# Patient Record
Sex: Female | Born: 1960 | Race: White | Hispanic: No | Marital: Married | Smoking: Never smoker
Health system: Southern US, Community
[De-identification: ages and names within clinical notes are randomized; demographics above are authoritative.]

## PROBLEM LIST (undated history)

## (undated) DIAGNOSIS — E7800 Pure hypercholesterolemia, unspecified: Secondary | ICD-10-CM

## (undated) DIAGNOSIS — F329 Major depressive disorder, single episode, unspecified: Secondary | ICD-10-CM

## (undated) DIAGNOSIS — K760 Fatty (change of) liver, not elsewhere classified: Secondary | ICD-10-CM

## (undated) DIAGNOSIS — K219 Gastro-esophageal reflux disease without esophagitis: Secondary | ICD-10-CM

## (undated) DIAGNOSIS — E039 Hypothyroidism, unspecified: Secondary | ICD-10-CM

## (undated) DIAGNOSIS — F319 Bipolar disorder, unspecified: Secondary | ICD-10-CM

## (undated) DIAGNOSIS — F32A Depression, unspecified: Secondary | ICD-10-CM

## (undated) DIAGNOSIS — F419 Anxiety disorder, unspecified: Secondary | ICD-10-CM

## (undated) DIAGNOSIS — S42202D Unspecified fracture of upper end of left humerus, subsequent encounter for fracture with routine healing: Secondary | ICD-10-CM

## (undated) HISTORY — DX: Anxiety disorder, unspecified: F41.9

## (undated) HISTORY — PX: DILATION AND CURETTAGE OF UTERUS: SHX78

## (undated) HISTORY — DX: Gastro-esophageal reflux disease without esophagitis: K21.9

## (undated) HISTORY — DX: Major depressive disorder, single episode, unspecified: F32.9

## (undated) HISTORY — DX: Depression, unspecified: F32.A

## (undated) HISTORY — DX: Unspecified fracture of upper end of left humerus, subsequent encounter for fracture with routine healing: S42.202D

## (undated) HISTORY — DX: Hypothyroidism, unspecified: E03.9

---

## 1991-02-20 HISTORY — PX: LAPAROSCOPIC CHOLECYSTECTOMY: SUR755

## 1997-08-07 ENCOUNTER — Emergency Department (HOSPITAL_COMMUNITY): Admission: EM | Admit: 1997-08-07 | Discharge: 1997-08-07 | Payer: Self-pay | Admitting: *Deleted

## 1998-04-15 ENCOUNTER — Other Ambulatory Visit: Admission: RE | Admit: 1998-04-15 | Discharge: 1998-04-15 | Payer: Self-pay | Admitting: Obstetrics and Gynecology

## 1999-04-06 ENCOUNTER — Other Ambulatory Visit: Admission: RE | Admit: 1999-04-06 | Discharge: 1999-04-06 | Payer: Self-pay | Admitting: Obstetrics and Gynecology

## 1999-08-18 ENCOUNTER — Inpatient Hospital Stay (HOSPITAL_COMMUNITY): Admission: AD | Admit: 1999-08-18 | Discharge: 1999-08-23 | Payer: Self-pay | Admitting: Psychiatry

## 1999-08-24 ENCOUNTER — Other Ambulatory Visit: Admission: RE | Admit: 1999-08-24 | Discharge: 1999-09-11 | Payer: Self-pay

## 2000-05-07 ENCOUNTER — Other Ambulatory Visit: Admission: RE | Admit: 2000-05-07 | Discharge: 2000-05-07 | Payer: Self-pay | Admitting: Obstetrics and Gynecology

## 2000-06-20 ENCOUNTER — Encounter: Admission: RE | Admit: 2000-06-20 | Discharge: 2000-06-20 | Payer: Self-pay | Admitting: Family Medicine

## 2000-06-20 ENCOUNTER — Encounter: Payer: Self-pay | Admitting: Family Medicine

## 2001-05-26 ENCOUNTER — Other Ambulatory Visit: Admission: RE | Admit: 2001-05-26 | Discharge: 2001-05-26 | Payer: Self-pay | Admitting: Obstetrics and Gynecology

## 2002-04-06 ENCOUNTER — Encounter: Payer: Self-pay | Admitting: Emergency Medicine

## 2002-04-06 ENCOUNTER — Emergency Department (HOSPITAL_COMMUNITY): Admission: EM | Admit: 2002-04-06 | Discharge: 2002-04-06 | Payer: Self-pay | Admitting: Emergency Medicine

## 2002-06-23 ENCOUNTER — Other Ambulatory Visit: Admission: RE | Admit: 2002-06-23 | Discharge: 2002-06-23 | Payer: Self-pay | Admitting: Obstetrics and Gynecology

## 2003-07-08 ENCOUNTER — Other Ambulatory Visit: Admission: RE | Admit: 2003-07-08 | Discharge: 2003-07-08 | Payer: Self-pay | Admitting: Obstetrics and Gynecology

## 2004-07-27 ENCOUNTER — Other Ambulatory Visit: Admission: RE | Admit: 2004-07-27 | Discharge: 2004-07-27 | Payer: Self-pay | Admitting: Obstetrics and Gynecology

## 2006-03-21 ENCOUNTER — Ambulatory Visit: Payer: Self-pay | Admitting: Cardiology

## 2006-03-21 ENCOUNTER — Observation Stay (HOSPITAL_COMMUNITY): Admission: EM | Admit: 2006-03-21 | Discharge: 2006-03-23 | Payer: Self-pay | Admitting: Emergency Medicine

## 2006-03-28 ENCOUNTER — Ambulatory Visit: Payer: Self-pay | Admitting: Gastroenterology

## 2006-04-02 ENCOUNTER — Ambulatory Visit: Payer: Self-pay | Admitting: Gastroenterology

## 2006-04-02 LAB — CONVERTED CEMR LAB
ALT: 107 units/L — ABNORMAL HIGH (ref 0–40)
AST: 75 units/L — ABNORMAL HIGH (ref 0–37)
Alkaline Phosphatase: 103 units/L (ref 39–117)
Total Bilirubin: 0.7 mg/dL (ref 0.3–1.2)
Total Protein: 6.8 g/dL (ref 6.0–8.3)
Transferrin: 252 mg/dL (ref >=212.0)

## 2006-05-02 ENCOUNTER — Ambulatory Visit: Payer: Self-pay | Admitting: Gastroenterology

## 2006-05-02 LAB — CONVERTED CEMR LAB
ALT: 45 units/L — ABNORMAL HIGH (ref 0–40)
AST: 34 units/L (ref 0–37)
Alkaline Phosphatase: 88 units/L (ref 39–117)
Total Bilirubin: 0.5 mg/dL (ref 0.3–1.2)
Total Protein: 7.2 g/dL (ref 6.0–8.3)

## 2008-11-23 LAB — HM DIABETES EYE EXAM

## 2008-12-01 ENCOUNTER — Encounter: Admission: RE | Admit: 2008-12-01 | Discharge: 2008-12-01 | Payer: Self-pay | Admitting: Emergency Medicine

## 2009-02-10 ENCOUNTER — Encounter: Admission: RE | Admit: 2009-02-10 | Discharge: 2009-02-10 | Payer: Self-pay | Admitting: Family Medicine

## 2009-03-20 LAB — HM DIABETES EYE EXAM

## 2009-06-28 ENCOUNTER — Emergency Department (HOSPITAL_COMMUNITY): Admission: EM | Admit: 2009-06-28 | Discharge: 2009-06-28 | Payer: Self-pay | Admitting: Emergency Medicine

## 2010-03-12 ENCOUNTER — Encounter: Payer: Self-pay | Admitting: Family Medicine

## 2010-03-20 LAB — HM DIABETES EYE EXAM

## 2010-05-09 LAB — POCT CARDIAC MARKERS
CKMB, poc: <1 ng/mL — ABNORMAL LOW (ref 1.0–8.0)
Myoglobin, poc: 38.6 ng/mL (ref 12–200)
Myoglobin, poc: 50.1 ng/mL (ref 12–200)
Troponin i, poc: <0.05 ng/mL (ref 0.00–0.09)
Troponin i, poc: <0.05 ng/mL (ref 0.00–0.09)

## 2010-05-09 LAB — CBC
MCHC: 34.6 g/dL (ref 30.0–36.0)
Platelets: 255 10*3/uL (ref 150–400)
RDW: 13.4 % (ref 11.5–15.5)

## 2010-05-09 LAB — DIFFERENTIAL
Basophils Absolute: 0.1 10*3/uL (ref 0.0–0.1)
Basophils Relative: 1 % (ref 0–1)
Eosinophils Absolute: 0.1 10*3/uL (ref 0.0–0.7)
Neutro Abs: 8.7 10*3/uL — ABNORMAL HIGH (ref 1.7–7.7)
Neutrophils Relative %: 68 % (ref 43–77)

## 2010-05-09 LAB — POCT I-STAT, CHEM 8
Chloride: 109 mEq/L (ref 96–112)
HCT: 38 % (ref 36.0–46.0)
Hemoglobin: 12.9 g/dL (ref 12.0–15.0)
Potassium: 4 mEq/L (ref 3.5–5.1)

## 2010-07-07 NOTE — H&P (Signed)
NAMEKAMLA, SKILTON NO.:  [id number]   MEDICAL RECORD NO.:  [id number]          PATIENT TYPE:  INP   LOCATION:  3735                         FACILITY:  [facility]   PHYSICIAN:  Tereso Newcomer, PA-C     DATE OF BIRTH:  30-Sep-1960   DATE OF ADMISSION:  03/21/2006  DATE OF DISCHARGE:                              HISTORY & PHYSICAL   PRIMARY CARE PHYSICIAN:  Dr. Lesle Chris.   CHIEF COMPLAINT:  Chest pain history of present illness.   HISTORY OF PRESENT ILLNESS:  Ms. Musto is a 50 year old female patient  with a new diagnosis of diabetes mellitus this past week, who presented  to her primary care physician today for follow-up on chest pain and  dizziness.  When she was originally seen, her blood sugar was over 300,  and it was felt that she probably was suffering dizziness secondary to  her diabetes from dehydration.  She was started on medication and seen  back today in followup.  She also had some chest discomfort.  Today, she  noted continued chest discomfort.  This has been off and on for the last  week.  It is a substernal tightness that she describes as a knot.  She  denies any radiation to her arm or jaw.  She denies any associated  shortness of breath.  She has noted associated nausea and diaphoresis.  She has also noted some dizziness.  Her dizziness is sometimes  associated with chest pain.  There is other times where she is dizzy  without chest pain.  She does not always get dizzy with chest pain.  She  has also had some other kinds of dizziness that she describes as a  spinning sensation, and she has had a diagnosis of vertigo in the past.  She does describe an episode the other that she thinks she passed out.  She was having chest pain and became dizzy.  She feels like she lost  consciousness for a few seconds.  She has also had episodes of tachy  palpitations with her chest pain.  She is currently having a slight  amount of chest discomfort.   PAST  MEDICAL HISTORY:  1. Significant for diabetes mellitus type 2, newly diagnosed this      week, elevated LFTs.  2. Elevated LFTs in the past, secondary to Risperdal.  3. Irritable bowel syndrome.  4. Gastroesophageal reflex disease.  5. History of cholecystectomy.  6. History of vertigo.  7. Migraine headaches.  8. Bipolar disorder.   She denies any history of hypertension or hyperlipidemia.   MEDICATIONS:  1. Metformin 500 mg b.i.d.  2. Depakote ER 500 mg daily.  3. Neurontin 300 mg 2 tablets daily.  4. Lamictal 100 mg daily.  5. Lyrica 75 mg daily.  6. Topamax 100 mg daily.  7. Trileptal 100 mg 1-1/2 tablet daily.  8. Klonopin 1 mg 1-1/2 tablet daily.  9. Rhinocort Aqua.   ALLERGIES:  RISPERDAL.   SOCIAL HISTORY:  The patient lives in [place] with her husband.  Her  husband is a patient of Dr. Tenny Craw.  She works at [employer] [place].  She  has 2 children age 68 and 13.  She denies tobacco or alcohol abuse.  Denies any drug abuse.   FAMILY HISTORY:  She is unsure about her father.  She does have a  maternal grandfather who had a myocardial infarction in his 14s.  Her  mother is alive and well.   REVIEW OF SYSTEMS:  Please see HPI.  Denies any fevers, chills cough,  melena, hematochezia, hematuria, dysuria.  She denies any hematemesis or  hemoptysis.  She denies any abdominal discomfort.  She did have a viral  gastroenteritis last week associated with vomiting.  This is clear.  She  denies any unilateral weakness, loss of vision or facial drooping or  difficulties with speech.  She denies any orthopnea or paroxysmal  nocturnal dyspnea.  She denies any dysphagia or odynophagia.  The rest  of the review of systems are negative.   PHYSICAL EXAM:  GENERAL:  She is a well-nourished, well-developed female  in no distress.  Blood pressure initially is 88/58.  Repeat blood  pressure was 160/98.  Recent blood pressure is 92/62, pulse 72,  respirations 20, temperature 97.2.  Oxygen  saturation is 99% on 2  liters.  Head normocephalic, atraumatic.  Eyes: PERRLA, EOMI.  Sclerae  are clear.  NECK:  Supple without lymphadenopathy.  No JVD noted.  ENDOCRINE:  Without thyromegaly.  VASCULAR:  Exam without carotid bruits bilaterally.  Dorsalis pedis and  posterior tibialis pulses 1+ bilaterally.  CARDIAC:  Normal S1-S2, regular rate and rhythm without murmurs.  LUNGS:  Clear to auscultation bilaterally without wheezing, rhonchi or  rales.  ABDOMEN:  Soft, nontender with normoactive bowel sounds, no  organomegaly.  No rebound, no guarding.  EXTREMITIES:  Without clubbing, cyanosis or edema.  MUSCULOSKELETAL:  Without spine or CVA tenderness.  SKIN:  Without rashes.  Skin is warm and dry.  NEUROLOGIC:  She is alert and oriented x3.  Cranial nerves II-XII are  grossly intact.   Electrocardiogram reveals sinus rhythm with a heart rate of 69, normal  axis.  She has nonspecific ST-T wave changes.   LABORATORY DATA:  From January 24th, drawn at Dr. Ellis Parents office:  TSH 2.412, total  bilirubin 0.3, AST 182, alkaline phosphatase 154, ALT 200, point of care  markers in the ER today negative x1.  INR in the ER today 0.9, sodium  35, potassium 4.3, BUN 6, creatinine 0.8, glucose 263, hemoglobin 14.3,  hematocrit 41.5, platelet count 236,000, white count 7,000.   Chest x-ray is pending.   IMPRESSION:  1. Chest pain worrisome for unstable angina pectoris.  2. Dizziness slash questionable syncope - associated with  3. Diabetes mellitus.  4. Newly diagnosed diabetes mellitus.  5. Bipolar disorder.  6. Elevated LFTs.  7. Gastroesophageal gastroesophageal reflux disease.  8. Irritable bowel syndrome.  9. History of migraine headaches.   PLAN:  The patient will be admitted to [facility].  We will  admit, and we will treat her with aspirin, Heparin.  Will try to add beta-blocker therapy as her blood pressure allows.  Will re-check her  lipid panel as well as amylase,  lipase, abdominal ultrasound.  Will also  get a Hepatitis panel.  Will get the gastroenterology service to see her  regarding her elevated LFTs.  She will require cardiac catheterization  to further evaluate her symptoms.  Risks and benefits of the procedure  have been discussed with the patient.  She agrees to  proceed.  The  patient was also interviewed and examined by Dr. Antoine Poche today.  He  helped formulate the above assessment and plan.      Tereso Newcomer, PA-C     SW/MEDQ  D:  03/21/2006  T:  03/21/2006  Job:  [id number]   cc:   Stan Head. Cleta Alberts, M.D.
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
## 2010-07-07 NOTE — Assessment & Plan Note (Signed)
[facility]                         GASTROENTEROLOGY OFFICE NOTE   Michelle Torres, Michelle Torres                      MRN:          [id number]  DATE:04/02/2006                            DOB:          06-19-1960    PRIMARY CARE PHYSICIAN:  Brett Canales A. Cleta Alberts, M.D.   PSYCHIATRIST:  Dr. Madaline Guthrie.   GI PROBLEM LIST:  1. Abnormal liver test: initially found to have elevated transaminases      January 2008.  AST 158, ALT 166.  Total bilirubin, alkaline      phosphate and albumin were all normal.  An ultrasound 2/08 showed a      diffusely increased echo texture consistent with fatty      infiltration.  Her CBD was 12 mm.  No stones in her bile duct.      Gallbladder had been surgically removed.  Workup for abnormal liver      test include ANA, antismooth muscle antibody, AMA, hepatitis A, B,      C; all these tests were negative.  TSH was normal.   INTERVAL HISTORY:  I last saw Michelle Torres when she was hospitalized at  [facility], consulted for abnormal liver test.  There was also  a question of whether her chest discomfort where esophageal in origin.  She began taking a proton pump inhibitor (omeprazole) once daily 20-30  minutes before her breakfast meal and her chest discomfort have not  returned.  So perhaps these were reflux related.  The lab workup that I  ordered while she was hospitalized has returned and is all summarized  above.   CURRENT MEDICATIONS:  Depakote, Neurontin, Lamictal, Lyrica, Topamax,  Trileptal, Klonopin, Prilosec, metformin, insulin and Nasonex.   ALLERGIES:  RISPERDAL.   PHYSICAL EXAMINATION:  At 4 foot 10-1/2 inches, 188 pounds.  Blood  pressure 110/78, pulse 76.  CONSTITUTIONAL:  Obese, otherwise well-appearing.  NEUROLOGICAL:  Alert and oriented x3.  EYES:  Extraocular movements intact.  ABDOMEN:  Soft, nontender, and nondistended.  Normal bowel sounds.  No  obvious ascites.  EXTREMITIES:  No lower extremity edema.   ASSESSMENT/PLAN:  A 50 year old woman with elevated transaminases,  likely from underlying fatty liver disease.  Most likely this is from  underlying fatty liver disease.  She has gained 30 pounds in the past  year.  She has a hemoglobin A1c of 13 and there is still diffusely  hyperechoic look to her liver on ultrasound.  All of this consistent  with fatty liver disease.  She does have a dilated bile duct but she  does not have convincing biliary symptoms.  No stones in her bile duct  and her bilirubin and alkaline phosphate are normal, so I do not think  this is biliary induced problem.  She is on several medicines for her  bipolar disease, most notable which is Depakote.  She has been on that  for many years but hepatic toxicity is a well known and severe  complication of Depakote.  Her liver tests are not at a range right now  where I am very nervous but I think they should  be followed closely.  I  have advised her to try to lose weight as best as possible.  I will  recheck her liver test today and I will check them monthly for a while  just to make sure there is no worrisome trend of them going much higher.  If they do start to trend up, she will need to stop her Depakote  immediately.  I will communicate this with Dr. Madaline Guthrie.  I will round up  the workup for her abnormal liver test with iron studies as they were  not done while she was hospitalized.  She seems committed to trying to  lose weight since she has been successful in the past.  Hopefully, she  will be able to.  If we do see a nice trend of her liver test heading  more normal, than I do not think we need to proceed with any more  testing.  If, however, they do not improve or they worsening we will  have to consider liver biopsy.     Rachael Fee, MD  Electronically Signed    DPJ/MedQ  DD: 04/02/2006  DT: 04/02/2006  Job #: [id number]   cc:   Brett Canales A. Cleta Alberts, M.D.

## 2010-07-07 NOTE — Cardiovascular Report (Signed)
NAMEKASSIDEE, NARCISO NO.:  [id number]   MEDICAL RECORD NO.:  [id number]          PATIENT TYPE:  INP   LOCATION:  3735                         FACILITY:  [facility]   PHYSICIAN:  Salvadore Farber, MD  DATE OF BIRTH:  [birth year]   DATE OF PROCEDURE:  03/22/2006  DATE OF DISCHARGE:                            CARDIAC CATHETERIZATION   PROCEDURE:  Left heart catheterization, left ventriculography, coronary  angiography, StarClose closure of the right common femoral arteriotomy  site.   INDICATIONS:  Ms. Loria is a 50 year old lady with newly diagnosed  diabetes mellitus who has suffered week of substernal chest tightness  occurring primarily at rest but made worse with exertion and better and  then improving with rest.  She was seen by Dr. Antoine Poche.  She ruled out  for myocardial infarction by serial electrocardiograms and cardiac  enzymes.  She was then referred for diagnostic angiography.   PROCEDURAL TECHNIQUE:  Informed consent was obtained.  Under 1%  lidocaine local anesthesia, a 5-French sheath was placed in the right  common femoral artery using the modified Seldinger technique.  Diagnostic angiography and ventriculography were performed using JL-4,  JR-4, and pigtail catheters.  The arteriotomy was then closed using a  StarClose device.  Complete hemostasis was obtained.  The patient was  then transferred to the holding room in stable condition having  tolerated the procedure well.   COMPLICATIONS:  None.   FINDINGS:  1. LV:  101/10/18.  EF 65% without regional wall motion abnormality.  2. No aortic stenosis or mitral regurgitation.  3. Left main:  Angiographically normal.  4. LAD:  Large vessel giving rise to one moderate-sized diagonal.  It      is angiographically normal.  5. Ramus intermedius:  Moderate-sized vessel which is angiographically      normal.  6. Circumflex:  Moderate-sized vessel giving rise to one obtuse      marginal and a posterior  left ventricular branch.  It is      angiographically normal.  7. RCA:  Moderate-sized dominant vessel.  It is angiographically      normal.   IMPRESSION/RECOMMENDATIONS:  1. Angiographically normal coronary arteries.  2. Normal left ventricular size and systolic function.  3. No aortic stenosis or mitral regurgitation.   I suspect a noncardiac etiology to her chest discomfort.  We will  evaluate further as an outpatient.      Salvadore Farber, MD  Electronically Signed     WED/MEDQ  D:  03/22/2006  T:  03/22/2006  Job:  [id number]   cc:   Brett Canales A. Cleta Alberts, M.D.  Rollene Rotunda, MD, [facility]
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
## 2010-07-07 NOTE — Discharge Summary (Signed)
Michelle Torres, Michelle Torres               ACCOUNT NO.:  [id number]   MEDICAL RECORD NO.:  [id number]          PATIENT TYPE:  INP   LOCATION:  3735                         FACILITY:  [facility]   PHYSICIAN:  Noralyn Pick. Eden Emms, MD, FACCDATE OF BIRTH:  [birth year]   DATE OF ADMISSION:  03/21/2006  DATE OF DISCHARGE:  03/23/2006                               DISCHARGE SUMMARY   PRIMARY CARE PHYSICIAN:  Dr. Earl Lites.   CONSULTANTS:  Gastroenterologist, Dr. Christella Hartigan.   PRINCIPAL DIAGNOSIS:  Chest pain.   SECONDARY DIAGNOSES:  1. Newly diagnosed type 2 diabetes mellitus.  2. Elevated liver function tests, likely secondary to fatty liver.  3. Irritable bowel syndrome.  4. Gastroesophageal reflux disease.  5. Status post cholecystectomy.  6. History of vertigo.  7. History of migraine headaches.  8. Bipolar disorder.   ALLERGIES:  RISPERDAL causes elevated LFTs.   PROCEDURES:  Left heart cardiac catheterization.   HISTORY OF PRESENT ILLNESS:  A 50 year old female recently diagnosed  with type 2 diabetes mellitus, who was initiated on metformin therapy  b.i.d. this week.  Over the past week, she has had a knot-like  midsternal chest tightness and discomfort without radiation or  associated symptoms, for which she presented to the [facility] ED on  March 21, 2006.  She ruled out for MI and was admitted for further  evaluation.   HOSPITAL COURSE:  Following admission, she was noted to have markedly  elevated LFTs with an AST of 182, ALT of 200, and alkaline phosphatase  of 154.  Gastroenterology was consulted and following abdominal  ultrasound, they felt that elevated LFTs were likely secondary to fatty  infiltration of the liver.  Given her complaints of chest pain, which  were concerning for angina with newly diagnosed diabetes, she underwent  left heart cardiac catheterization on March 22, 2006, revealing an EF  of 65% and normal coronary arteries.  Her symptoms were thus felt to be  secondary to GERD and she was placed on proton pump inhibitor therapy.  She will follow up with Dr. Christella Hartigan in GI in approximately two to three  weeks.  She is being discharged home today in satisfactory condition.   DISCHARGE LABORATORY DATA:  Hemoglobin 13.6, hematocrit 39.7, WBCs 6.7,  platelets 230.  Sodium 136, potassium 3.7, chloride 106, CO2 of 22, BUN  5, creatinine 0.65, glucose 260, total bilirubin 0.4, alkaline  phosphatase 99, AST 158, ALT 166, total protein 6.5, albumin 3.7,  calcium 8.6.  Hemoglobin A1c 13.0.  Amylase 108 and lipase 34.  CK 65,  MB 0.6, troponin I of 0.01.  Total cholesterol 201, triglycerides 249,  HDL 43, LDL 108.  TSH 1.174.  Valproic acid 41.4.   DISPOSITION:  The patient is being discharged home today in good  condition.   FOLLOWUP:  She was asked to follow up with her primary care physician,  Dr. Pollie Friar, in one to two weeks.  She will be contacted by [facility] to  arrange follow up with Dr. Christella Hartigan in two to three weeks.   DISCHARGE MEDICATIONS:  Protonix 40 mg daily, aspirin 81  mg daily,  metformin 500 mg b.i.d., Depakote ER 500 mg daily, Neurontin 300 mg  b.i.d., Lyrica 75 mg daily, Topamax 100 mg daily, Trileptal 150 mg  daily, Klonopin 1.5 mg daily.  The patient was not initiated on statin  therapy despite diagnosis of diabetes secondary to elevated LFTs.   OUTSTANDING LABORATORY STUDIES:  None.   DURATION OF DISCHARGE ENCOUNTER:  Forty minutes, including physician's  time.      Nicolasa Ducking, ANP      Noralyn Pick. Eden Emms, MD, [facility]  Electronically Signed    CB/MEDQ  D:  03/23/2006  T:  03/24/2006  Job:  [id number]   cc:   Meryl Dare, MD

## 2011-02-22 ENCOUNTER — Ambulatory Visit (INDEPENDENT_AMBULATORY_CARE_PROVIDER_SITE_OTHER): Payer: BC Managed Care – PPO

## 2011-02-22 ENCOUNTER — Observation Stay (HOSPITAL_COMMUNITY)
Admission: EM | Admit: 2011-02-22 | Discharge: 2011-02-23 | Disposition: A | Discharge status: Home / Self Care | Payer: BC Managed Care – PPO | Attending: Emergency Medicine | Admitting: Emergency Medicine

## 2011-02-22 ENCOUNTER — Emergency Department (HOSPITAL_COMMUNITY): Payer: BC Managed Care – PPO

## 2011-02-22 ENCOUNTER — Encounter: Payer: Self-pay | Admitting: Physical Medicine and Rehabilitation

## 2011-02-22 DIAGNOSIS — I10 Essential (primary) hypertension: Secondary | ICD-10-CM | POA: Insufficient documentation

## 2011-02-22 DIAGNOSIS — R0602 Shortness of breath: Secondary | ICD-10-CM | POA: Insufficient documentation

## 2011-02-22 DIAGNOSIS — E119 Type 2 diabetes mellitus without complications: Secondary | ICD-10-CM | POA: Insufficient documentation

## 2011-02-22 DIAGNOSIS — E7800 Pure hypercholesterolemia, unspecified: Secondary | ICD-10-CM

## 2011-02-22 DIAGNOSIS — F3289 Other specified depressive episodes: Secondary | ICD-10-CM | POA: Insufficient documentation

## 2011-02-22 DIAGNOSIS — R079 Chest pain, unspecified: Principal | ICD-10-CM | POA: Insufficient documentation

## 2011-02-22 DIAGNOSIS — R110 Nausea: Secondary | ICD-10-CM | POA: Insufficient documentation

## 2011-02-22 DIAGNOSIS — F329 Major depressive disorder, single episode, unspecified: Secondary | ICD-10-CM | POA: Insufficient documentation

## 2011-02-22 DIAGNOSIS — R0789 Other chest pain: Secondary | ICD-10-CM

## 2011-02-22 HISTORY — DX: Pure hypercholesterolemia, unspecified: E78.00

## 2011-02-22 HISTORY — DX: Bipolar disorder, unspecified: F31.9

## 2011-02-22 LAB — BASIC METABOLIC PANEL
Calcium: 9.5 mg/dL (ref 8.4–10.5)
Creatinine, Ser: 0.67 mg/dL (ref 0.50–1.10)
GFR calc non Af Amer: >90 mL/min (ref >90)
Glucose, Bld: 143 mg/dL — ABNORMAL HIGH (ref 70–99)
Sodium: 140 mEq/L (ref 135–145)

## 2011-02-22 LAB — DIFFERENTIAL
Basophils Absolute: 0.1 10*3/uL (ref 0.0–0.1)
Eosinophils Absolute: 0.1 10*3/uL (ref 0.0–0.7)
Eosinophils Relative: 1 % (ref 0–5)
Monocytes Absolute: 0.6 10*3/uL (ref 0.1–1.0)

## 2011-02-22 LAB — CBC
MCH: 29.5 pg (ref 26.0–34.0)
MCV: 87.9 fL (ref 78.0–100.0)
Platelets: 258 10*3/uL (ref 150–400)
RDW: 13.2 % (ref 11.5–15.5)

## 2011-02-22 LAB — PROTIME-INR
INR: 0.95 (ref 0.00–1.49)
Prothrombin Time: 12.9 seconds (ref 11.6–15.2)

## 2011-02-22 LAB — CARDIAC PANEL(CRET KIN+CKTOT+MB+TROPI)
CK, MB: 3.1 ng/mL (ref 0.3–4.0)
Relative Index: INVALID (ref 0.0–2.5)
Relative Index: INVALID (ref 0.0–2.5)
Troponin I: <0.3 ng/mL (ref <0.30)

## 2011-02-22 MED ORDER — NAPROXEN SODIUM 275 MG PO TABS: 440.0000 mg | ORAL_TABLET | Freq: Two times a day (BID) | ORAL | Status: DC

## 2011-02-22 MED ORDER — LAMIVUDINE 150 MG PO TABS
150.0000 mg | ORAL_TABLET | Freq: Every day | ORAL | Status: DC
  Administered 2011-02-22 – 2011-02-23 (×2): 150 mg via ORAL
  Filled 2011-02-22 (×2): qty 1

## 2011-02-22 MED ORDER — DIVALPROEX SODIUM ER 500 MG PO TB24
500.0000 mg | ORAL_TABLET | Freq: Every day | ORAL | Status: DC
  Administered 2011-02-22: 500 mg via ORAL
  Filled 2011-02-22: qty 1

## 2011-02-22 MED ORDER — MORPHINE SULFATE 2 MG/ML IJ SOLN
2.0000 mg | Freq: Once | INTRAMUSCULAR | Status: AC
  Administered 2011-02-22: 2 mg via INTRAVENOUS
  Filled 2011-02-22: qty 1

## 2011-02-22 MED ORDER — INSULIN GLARGINE 100 UNIT/ML ~~LOC~~ SOLN
36.0000 [IU] | Freq: Every day | SUBCUTANEOUS | Status: DC
  Administered 2011-02-22: 36 [IU] via SUBCUTANEOUS
  Filled 2011-02-22: qty 1

## 2011-02-22 MED ORDER — ACETAMINOPHEN 325 MG PO TABS: 650.0000 mg | ORAL_TABLET | ORAL | Status: DC | PRN

## 2011-02-22 MED ORDER — ASPIRIN EC 325 MG PO TBEC: 325.0000 mg | DELAYED_RELEASE_TABLET | Freq: Every day | ORAL | Status: DC

## 2011-02-22 MED ORDER — LAMIVUDINE 150 MG PO TABS: 150.0000 mg | ORAL_TABLET | Freq: Every day | ORAL | Status: DC

## 2011-02-22 MED ORDER — TOPIRAMATE 100 MG PO TABS
100.0000 mg | ORAL_TABLET | Freq: Every day | ORAL | Status: DC
  Administered 2011-02-22: 100 mg via ORAL
  Filled 2011-02-22: qty 1

## 2011-02-22 MED ORDER — GABAPENTIN 600 MG PO TABS
600.0000 mg | ORAL_TABLET | Freq: Every day | ORAL | Status: DC
  Administered 2011-02-22: 600 mg via ORAL
  Filled 2011-02-22: qty 1

## 2011-02-22 MED ORDER — NAPROXEN 500 MG PO TABS
500.0000 mg | ORAL_TABLET | Freq: Two times a day (BID) | ORAL | Status: DC
  Administered 2011-02-23: 500 mg via ORAL
  Filled 2011-02-22: qty 1

## 2011-02-22 MED ORDER — CLONAZEPAM 0.5 MG PO TABS
3.0000 mg | ORAL_TABLET | Freq: Every day | ORAL | Status: DC
  Administered 2011-02-22: 3 mg via ORAL
  Filled 2011-02-22: qty 6

## 2011-02-22 MED ORDER — GI COCKTAIL ~~LOC~~: 30.0000 mL | Freq: Four times a day (QID) | ORAL | Status: DC | PRN

## 2011-02-22 MED ORDER — OXCARBAZEPINE 150 MG PO TABS
150.0000 mg | ORAL_TABLET | Freq: Every day | ORAL | Status: DC
  Administered 2011-02-22: 150 mg via ORAL
  Filled 2011-02-22: qty 1

## 2011-02-22 MED ORDER — CALCIUM CARBONATE-VITAMIN D 500-200 MG-UNIT PO TABS
1.0000 | ORAL_TABLET | Freq: Every day | ORAL | Status: DC
  Administered 2011-02-22 – 2011-02-23 (×2): 1 via ORAL
  Filled 2011-02-22 (×2): qty 1

## 2011-02-22 MED ORDER — ONDANSETRON HCL 4 MG/2ML IJ SOLN: 4.0000 mg | Freq: Four times a day (QID) | INTRAMUSCULAR | Status: DC | PRN

## 2011-02-22 MED ORDER — ZOLPIDEM TARTRATE 5 MG PO TABS: 5.0000 mg | ORAL_TABLET | Freq: Every evening | ORAL | Status: DC | PRN

## 2011-02-22 MED ORDER — SERTRALINE HCL 25 MG PO TABS
25.0000 mg | ORAL_TABLET | Freq: Every day | ORAL | Status: DC
  Administered 2011-02-22: 25 mg via ORAL
  Filled 2011-02-22: qty 1

## 2011-02-22 MED ORDER — ASPIRIN 325 MG PO TABS
325.0000 mg | ORAL_TABLET | Freq: Every day | ORAL | Status: DC
  Administered 2011-02-22: 325 mg via ORAL
  Filled 2011-02-22: qty 1

## 2011-02-22 MED ORDER — PREGABALIN 75 MG PO CAPS
75.0000 mg | ORAL_CAPSULE | Freq: Every day | ORAL | Status: DC
  Administered 2011-02-22: 75 mg via ORAL
  Filled 2011-02-22: qty 1

## 2011-02-22 MED ORDER — METFORMIN HCL 500 MG PO TABS
1000.0000 mg | ORAL_TABLET | Freq: Two times a day (BID) | ORAL | Status: DC
  Administered 2011-02-23: 1000 mg via ORAL
  Filled 2011-02-22: qty 2

## 2011-02-22 MED ORDER — METFORMIN HCL 500 MG PO TABS: 1000.0000 mg | ORAL_TABLET | Freq: Two times a day (BID) | ORAL | Status: DC

## 2011-02-22 MED ORDER — ONDANSETRON HCL 4 MG/2ML IJ SOLN
4.0000 mg | Freq: Once | INTRAMUSCULAR | Status: AC
  Administered 2011-02-22: 4 mg via INTRAVENOUS
  Filled 2011-02-22: qty 2

## 2011-02-22 NOTE — ED Notes (Signed)
Diabetic Diet Tray Ordered

## 2011-02-22 NOTE — ED Notes (Signed)
Lab at the bedside attempting to draw. Pt remains on cardiac monitor. Resting quietly. 3/10 chest pain. No signs of distress at the time.

## 2011-02-22 NOTE — ED Notes (Signed)
Pt in per [facility] from [facility], pt picked at 11:40, pt rcvd 3 SL Nitro & ASA prior to arrival, pt c/o CP onset this am @ 3:30, NAD

## 2011-02-22 NOTE — ED Notes (Signed)
Pt placed in CDU rm 7. Noted alert and oriented. Placed on continuous cardiac, bp and pulse ox monitor. Noted husband at bedside. IV site SL and benign. Pt weighed for chest pain protocol and results documented and given to NP.

## 2011-02-22 NOTE — ED Notes (Signed)
Pt continues to state she has a dull ache in her substernal chest. Pain rating 3/10 at present. Pt lying in bed, NAD, laughing, conversing with visitors.

## 2011-02-22 NOTE — ED Provider Notes (Signed)
History     CSN: [id number]  Arrival date & time 02/22/11  1247   First MD Initiated Contact with Patient 02/22/11 1308      Chief Complaint  Patient presents with  . Chest Pain    (Consider location/radiation/quality/duration/timing/severity/associated sxs/prior treatment) HPI Comments: Patient presents via EMS from urgent care with substernal chest pain it started around 3 AM. He has been constant since. Is associated with some diaphoresis, nausea and shortness of breath. She received nitroglycerin and aspirin with some improvement. She's not had pain like this in the past. She was at her normal doctor's appointment for diabetes recheck. She reports having a negative catheterization about 4 years ago at this facility though I do not see that in the computer.  As a history of hypertension and hyperlipidemia. There is nothing that makes the pain better or worse.  The history is provided by the patient.    Past Medical History  Diagnosis Date  . Diabetes mellitus   . Depression   . Hypertension     Past Surgical History  Procedure Date  . Cholecystectomy     No family history on file.  History  Substance Use Topics  . Smoking status: Not on file  . Smokeless tobacco: Not on file  . Alcohol Use:     OB History    Grav Para Term Preterm Abortions TAB SAB Ect Mult Living                  Review of Systems  Constitutional: Negative for fever, activity change and appetite change.  HENT: Negative for congestion and rhinorrhea.   Respiratory: Positive for chest tightness and shortness of breath. Negative for cough.   Cardiovascular: Positive for chest pain.  Gastrointestinal: Positive for nausea. Negative for vomiting and abdominal pain.  Genitourinary: Negative for dysuria, vaginal bleeding and vaginal discharge.  Musculoskeletal: Negative for back pain.  Skin: Negative for rash.  Neurological: Negative for headaches.    Allergies  Risperidone and related  Home  Medications   Current Outpatient Rx  Name Route Sig Dispense Refill  . ASPIRIN 325 MG PO TABS Oral Take 325 mg by mouth daily.      Ruthell Rummage 500/200 D-3 PO Oral Take 1 tablet by mouth daily.      Marland Kitchen CLONAZEPAM 1 MG PO TABS Oral Take 3 mg by mouth at bedtime.      Marland Kitchen DIVALPROEX SODIUM ER 500 MG PO TB24 Oral Take 500 mg by mouth at bedtime.      Marland Kitchen GABAPENTIN 600 MG PO TABS Oral Take 600 mg by mouth at bedtime.      . INSULIN GLARGINE 100 UNIT/ML [place] SOLN Subcutaneous Inject 36 Units into the skin at bedtime.      Marland Kitchen LAMIVUDINE 100 MG PO TABS Oral Take 150 mg by mouth daily.      Marland Kitchen METFORMIN HCL 1000 MG PO TABS Oral Take 1,000 mg by mouth 2 (two) times daily with a meal.      . NAPROXEN SODIUM 220 MG PO TABS Oral Take 440 mg by mouth 2 (two) times daily with a meal. For pain     . OXCARBAZEPINE 150 MG PO TABS Oral Take 150 mg by mouth at bedtime.      Marland Kitchen PREGABALIN 75 MG PO CAPS Oral Take 75 mg by mouth daily.      . SERTRALINE HCL 25 MG PO TABS Oral Take 25 mg by mouth daily.      Marland Kitchen  TOPIRAMATE 100 MG PO TABS Oral Take 100 mg by mouth at bedtime.        BP 102/65  Pulse 66  Temp(Src) 98.6 F (37 C) (Oral)  Resp 19  SpO2 100%  Physical Exam  Constitutional: She is oriented to person, place, and time. She appears well-developed and well-nourished. No distress.  HENT:  Head: Normocephalic and atraumatic.  Mouth/Throat: Oropharynx is clear and moist. No oropharyngeal exudate.  Eyes: Conjunctivae are normal. Pupils are equal, round, and reactive to light.  Neck: Normal range of motion. Neck supple.  Cardiovascular: Normal rate, regular rhythm and normal heart sounds.   Pulmonary/Chest: Effort normal and breath sounds normal. No respiratory distress. She exhibits no tenderness.       Chest pain is not reproducible  Abdominal: Soft. There is no tenderness. There is no rebound and no guarding.  Musculoskeletal: Normal range of motion. She exhibits no edema and no tenderness.  Neurological: She  is alert and oriented to person, place, and time. No cranial nerve deficit.  Skin: Skin is warm.    ED Course  Procedures (including critical care time)  Labs Reviewed  BASIC METABOLIC PANEL - Abnormal; Notable for the following:    Glucose, Bld 143 (*)    All other components within normal limits  CBC  DIFFERENTIAL  PROTIME-INR  CARDIAC PANEL(CRET KIN+CKTOT+MB+TROPI)  CARDIAC PANEL(CRET KIN+CKTOT+MB+TROPI)   Dg Chest 2 View  02/22/2011  *RADIOLOGY REPORT*  Clinical Data:  Chest pain, diaphoresis and nausea.  CHEST - 2 VIEW  Comparison:  Findings: The heart size and mediastinal contours are within normal limits.  Both lungs are clear.  The visualized skeletal structures are unremarkable.  IMPRESSION: No active disease.  Original Report Authenticated By: Reola Calkins, M.D.     1. Chest pain       MDM  Substernal chest pain associated with shortness of breath, nausea diaphoresis.  Pain is improved with nitroglycerin and morphine. TIMI score 1 (ASA use).   Cardiac enzymes negative, EKG no acute ischemia. Cardiac catheterization results found from February 2008 showed a normal coronary anatomy normal left ventricular size and function.  Pain has improved and patient does not have any exclusion criteria for CDU chest pain protocol.  D/w NP Katrinka Blazing.   Date: 02/22/2011  Rate: 62  Rhythm: normal sinus rhythm  QRS Axis: right  Intervals: normal  ST/T Wave abnormalities: nonspecific ST/T changes  Conduction Disutrbances:none  Narrative Interpretation:   Old EKG Reviewed: none available         Glynn Octave, MD 02/22/11 1715

## 2011-02-22 NOTE — ED Notes (Signed)
Pt had aspirin prior to arrival at primary doctor.

## 2011-02-23 ENCOUNTER — Other Ambulatory Visit: Payer: Self-pay

## 2011-02-23 DIAGNOSIS — R072 Precordial pain: Secondary | ICD-10-CM

## 2011-02-23 LAB — POCT I-STAT TROPONIN I: Troponin i, poc: 0 ng/mL (ref 0.00–0.08)

## 2011-02-23 NOTE — ED Provider Notes (Signed)
Medical screening examination/treatment/procedure(s) were performed by non-physician practitioner and as supervising physician I was immediately available for consultation/collaboration. Soila Printup Y.   Gavin Pound. Raghav Verrilli, MD 02/23/11 1601

## 2011-02-23 NOTE — ED Provider Notes (Signed)
Medical screening examination/treatment/procedure(s) were conducted as a shared visit with non-physician practitioner(s) and myself.  I personally evaluated the patient during the encounter   Mialani Reicks, MD 02/23/11 0639 

## 2011-02-23 NOTE — ED Notes (Signed)
PT STILL IN VASCULAR LAB

## 2011-02-23 NOTE — Progress Notes (Signed)
  Echocardiogram Echocardiogram Stress Test has been performed.  Michelle Torres [facility] 02/23/2011, 10:10 AM

## 2011-02-23 NOTE — ED Notes (Signed)
Pt given a sandwich and soda to eat before discharge due to being npo and having her long acting insulin last pm.

## 2011-02-23 NOTE — ED Provider Notes (Signed)
Patient moved to CDU under chest pain protocol.  Pt resting comfortably at present with family at bedside.  S1/S2, RRR, no murmur.  Lungs CTA bilaterally.  Abdomen soft, bowel sounds present. Strong distal pulses palpated all extremities. NSR on monitor.  No ischemic changes on ECG.  Negative cardiac markers.  Scheduled for echo stress in AM.  Jimmye Norman, NP 02/23/11 0011

## 2011-02-23 NOTE — ED Provider Notes (Signed)
51 year old female presenting to the ED with a chief complaints of chest pain. Patient is currently in CDU under chest pain protocol. She is currently awaiting stress echo this a.m.  12:05 PM A cardiac stress test is within normal limits. Patient is PERC negative, not tachycardic, with a normal chest x-ray. I have discussed with my attending who felt that further workup for PE is not warranted.  I encouraged the patient to followup with her primary care provider, Dr. Cleta Alberts.  Patient voiced understanding and agree with plan.  Fayrene Helper, [place] 02/23/11 1208

## 2011-02-23 NOTE — Progress Notes (Signed)
Post d/c review completed. Michelle Torres 02/23/2011 228p

## 2011-03-01 ENCOUNTER — Emergency Department (HOSPITAL_COMMUNITY): Payer: BC Managed Care – PPO

## 2011-03-01 ENCOUNTER — Emergency Department (HOSPITAL_COMMUNITY)
Admission: EM | Admit: 2011-03-01 | Discharge: 2011-03-02 | Disposition: A | Discharge status: Home / Self Care | Payer: BC Managed Care – PPO | Attending: Emergency Medicine | Admitting: Emergency Medicine

## 2011-03-01 ENCOUNTER — Other Ambulatory Visit: Payer: Self-pay

## 2011-03-01 ENCOUNTER — Encounter (HOSPITAL_COMMUNITY): Payer: Self-pay

## 2011-03-01 DIAGNOSIS — R05 Cough: Secondary | ICD-10-CM | POA: Insufficient documentation

## 2011-03-01 DIAGNOSIS — F319 Bipolar disorder, unspecified: Secondary | ICD-10-CM | POA: Insufficient documentation

## 2011-03-01 DIAGNOSIS — J069 Acute upper respiratory infection, unspecified: Secondary | ICD-10-CM | POA: Insufficient documentation

## 2011-03-01 DIAGNOSIS — E119 Type 2 diabetes mellitus without complications: Secondary | ICD-10-CM | POA: Insufficient documentation

## 2011-03-01 DIAGNOSIS — K7689 Other specified diseases of liver: Secondary | ICD-10-CM | POA: Insufficient documentation

## 2011-03-01 DIAGNOSIS — Z79899 Other long term (current) drug therapy: Secondary | ICD-10-CM | POA: Insufficient documentation

## 2011-03-01 DIAGNOSIS — R7402 Elevation of levels of lactic acid dehydrogenase (LDH): Secondary | ICD-10-CM | POA: Insufficient documentation

## 2011-03-01 DIAGNOSIS — K760 Fatty (change of) liver, not elsewhere classified: Secondary | ICD-10-CM

## 2011-03-01 DIAGNOSIS — R7401 Elevation of levels of liver transaminase levels: Secondary | ICD-10-CM | POA: Insufficient documentation

## 2011-03-01 DIAGNOSIS — E7800 Pure hypercholesterolemia, unspecified: Secondary | ICD-10-CM | POA: Insufficient documentation

## 2011-03-01 DIAGNOSIS — R109 Unspecified abdominal pain: Secondary | ICD-10-CM | POA: Insufficient documentation

## 2011-03-01 DIAGNOSIS — R079 Chest pain, unspecified: Secondary | ICD-10-CM | POA: Insufficient documentation

## 2011-03-01 DIAGNOSIS — J3489 Other specified disorders of nose and nasal sinuses: Secondary | ICD-10-CM | POA: Insufficient documentation

## 2011-03-01 DIAGNOSIS — R748 Abnormal levels of other serum enzymes: Secondary | ICD-10-CM

## 2011-03-01 DIAGNOSIS — Z7982 Long term (current) use of aspirin: Secondary | ICD-10-CM | POA: Insufficient documentation

## 2011-03-01 DIAGNOSIS — R059 Cough, unspecified: Secondary | ICD-10-CM | POA: Insufficient documentation

## 2011-03-01 DIAGNOSIS — Z794 Long term (current) use of insulin: Secondary | ICD-10-CM | POA: Insufficient documentation

## 2011-03-01 DIAGNOSIS — R110 Nausea: Secondary | ICD-10-CM | POA: Insufficient documentation

## 2011-03-01 LAB — CBC
HCT: 39.4 % (ref 36.0–46.0)
Hemoglobin: 13.3 g/dL (ref 12.0–15.0)
MCH: 30 pg (ref 26.0–34.0)
MCHC: 33.8 g/dL (ref 30.0–36.0)
MCV: 88.7 fL (ref 78.0–100.0)
RBC: 4.44 MIL/uL (ref 3.87–5.11)

## 2011-03-01 LAB — COMPREHENSIVE METABOLIC PANEL
ALT: 254 U/L — ABNORMAL HIGH (ref 0–35)
AST: 452 U/L — ABNORMAL HIGH (ref 0–37)
Albumin: 4.2 g/dL (ref 3.5–5.2)
Alkaline Phosphatase: 184 U/L — ABNORMAL HIGH (ref 39–117)
BUN: 14 mg/dL (ref 6–23)
Chloride: 104 mEq/L (ref 96–112)
Potassium: 4 mEq/L (ref 3.5–5.1)
Sodium: 139 mEq/L (ref 135–145)
Total Bilirubin: 0.9 mg/dL (ref 0.3–1.2)
Total Protein: 7.8 g/dL (ref 6.0–8.3)

## 2011-03-01 LAB — VALPROIC ACID LEVEL: Valproic Acid Lvl: 29.6 ug/mL — ABNORMAL LOW (ref 50.0–100.0)

## 2011-03-01 MED ORDER — ASPIRIN EC 325 MG PO TBEC
325.0000 mg | DELAYED_RELEASE_TABLET | Freq: Once | ORAL | Status: AC
  Administered 2011-03-01: 325 mg via ORAL
  Filled 2011-03-01: qty 1

## 2011-03-01 MED ORDER — IOHEXOL 300 MG/ML  SOLN
20.0000 mL | INTRAMUSCULAR | Status: AC
  Administered 2011-03-01: 20 mL via ORAL

## 2011-03-01 MED ORDER — ALBUTEROL SULFATE (5 MG/ML) 0.5% IN NEBU
5.0000 mg | INHALATION_SOLUTION | Freq: Once | RESPIRATORY_TRACT | Status: AC
  Administered 2011-03-01: 5 mg via RESPIRATORY_TRACT
  Filled 2011-03-01: qty 1

## 2011-03-01 MED ORDER — MORPHINE SULFATE 2 MG/ML IJ SOLN
2.0000 mg | Freq: Once | INTRAMUSCULAR | Status: AC
  Administered 2011-03-01: 2 mg via INTRAVENOUS
  Filled 2011-03-01: qty 1

## 2011-03-01 MED ORDER — IOHEXOL 300 MG/ML  SOLN
100.0000 mL | Freq: Once | INTRAMUSCULAR | Status: AC | PRN
  Administered 2011-03-01: 100 mL via INTRAVENOUS

## 2011-03-01 MED ORDER — METHYLPREDNISOLONE SODIUM SUCC 125 MG IJ SOLR
125.0000 mg | Freq: Once | INTRAMUSCULAR | Status: AC
  Administered 2011-03-01: 125 mg via INTRAVENOUS
  Filled 2011-03-01: qty 2

## 2011-03-01 MED ORDER — GI COCKTAIL ~~LOC~~
30.0000 mL | Freq: Once | ORAL | Status: AC
  Administered 2011-03-01: 30 mL via ORAL
  Filled 2011-03-01: qty 30

## 2011-03-01 MED ORDER — ONDANSETRON 4 MG PO TBDP
ORAL_TABLET | ORAL | Status: AC
  Administered 2011-03-01: 8 mg
  Filled 2011-03-01: qty 2

## 2011-03-01 MED ORDER — ONDANSETRON HCL 4 MG/2ML IJ SOLN
4.0000 mg | Freq: Once | INTRAMUSCULAR | Status: AC
  Administered 2011-03-01: 4 mg via INTRAVENOUS
  Filled 2011-03-01: qty 2

## 2011-03-01 MED ORDER — SODIUM CHLORIDE 0.9 % IV SOLN
INTRAVENOUS | Status: DC
  Administered 2011-03-01 (×2): via INTRAVENOUS

## 2011-03-01 NOTE — ED Notes (Signed)
Pt was seen last week for the same complaint. She states that she had the full cardiac workup from Thursday to Friday of last week. She had a stress test and serial enzymes were negative. Pt states that she has continued to have chest pain but when locating it the area seems to be more epigastric in nature. Pt states that she did feel slightly dizzy earlier in the day along with some nausea. Pt states that she has not eaten very much today due to the nausea. Alert and oriented.

## 2011-03-01 NOTE — ED Provider Notes (Signed)
History     CSN: [id number]  Arrival date & time 03/01/11  1601   First MD Initiated Contact with Patient 03/01/11 1857    HPI Patient reports having upper respiratory tract action for the last few days. States today while at work acutely developed this sharp substernal chest pain. Reports pain has been constant since then. Reports also having bilateral abdominal pain, and nausea for the last 3 days as well. States his symptoms are needed since she was here one week ago. Reports last week she was here in the chest pain protocol and had a negative stress test. Patient is a 51 y.o. female presenting with abdominal pain.  Abdominal Pain The primary symptoms of the illness include abdominal pain and nausea. The primary symptoms of the illness do not include fever, shortness of breath, vomiting, diarrhea, dysuria or vaginal discharge. The current episode started 6 to 12 hours ago.  Symptoms associated with the illness do not include chills, constipation, urgency, hematuria, frequency or back pain.    Past Medical History  Diagnosis Date  . Diabetes mellitus   . Bipolar disorder   . High cholesterol     Past Surgical History  Procedure Date  . Cholecystectomy     History reviewed. No pertinent family history.  History  Substance Use Topics  . Smoking status: Never Smoker   . Smokeless tobacco: Not on file  . Alcohol Use: Yes     once every couple months    OB History    Grav Para Term Preterm Abortions TAB SAB Ect Mult Living                  Review of Systems  Constitutional: Negative for fever and chills.  HENT: Positive for congestion and rhinorrhea. Negative for sore throat, neck pain and neck stiffness.   Respiratory: Positive for cough. Negative for shortness of breath.   Cardiovascular: Positive for chest pain.  Gastrointestinal: Positive for nausea and abdominal pain. Negative for vomiting, diarrhea and constipation.  Genitourinary: Negative for dysuria, urgency,  frequency, hematuria, flank pain, vaginal discharge and vaginal pain.  Musculoskeletal: Negative for back pain.  Neurological: Negative for dizziness, numbness and headaches.  All other systems reviewed and are negative.    Allergies  Risperidone and related  Home Medications   Current Outpatient Rx  Name Route Sig Dispense Refill  . ASPIRIN 325 MG PO TABS Oral Take 325 mg by mouth daily.      Ruthell Rummage 500/200 D-3 PO Oral Take 1 tablet by mouth daily.      Marland Kitchen CLONAZEPAM 1 MG PO TABS Oral Take 3 mg by mouth at bedtime.      Marland Kitchen DIVALPROEX SODIUM ER 500 MG PO TB24 Oral Take 500 mg by mouth at bedtime.      Marland Kitchen GABAPENTIN 600 MG PO TABS Oral Take 600 mg by mouth at bedtime.      . INSULIN GLARGINE 100 UNIT/ML [place] SOLN Subcutaneous Inject 36 Units into the skin at bedtime.      Marland Kitchen LAMIVUDINE 100 MG PO TABS Oral Take 150 mg by mouth daily.      Marland Kitchen METFORMIN HCL 1000 MG PO TABS Oral Take 1,000 mg by mouth 2 (two) times daily with a meal.      . NAPROXEN SODIUM 220 MG PO TABS Oral Take 440 mg by mouth 2 (two) times daily with a meal. For pain     . OXCARBAZEPINE 150 MG PO TABS Oral Take 150 mg  by mouth at bedtime.      Marland Kitchen PREGABALIN 75 MG PO CAPS Oral Take 75 mg by mouth daily.      . SERTRALINE HCL 25 MG PO TABS Oral Take 25 mg by mouth daily.      . TOPIRAMATE 100 MG PO TABS Oral Take 100 mg by mouth at bedtime.        BP 100/53  Pulse 62  Temp(Src) 97.6 F (36.4 C) (Oral)  Resp 23  SpO2 95%  Physical Exam  Vitals reviewed. Constitutional: She is oriented to person, place, and time. Vital signs are normal. She appears well-developed and well-nourished. She does not have a sickly appearance. She does not appear ill.  HENT:  Head: Normocephalic and atraumatic.  Right Ear: External ear normal.  Left Ear: External ear normal.  Nose: Nose normal.  Mouth/Throat: Oropharynx is clear and moist. No oropharyngeal exudate.  Eyes: Conjunctivae and EOM are normal. Pupils are equal, round, and  reactive to light.  Neck: Normal range of motion. Neck supple.  Cardiovascular: Normal rate, regular rhythm and normal heart sounds.  Exam reveals no friction rub.   No murmur heard. Pulmonary/Chest: Effort normal and breath sounds normal. She has no wheezes. She has no rhonchi. She has no rales. She exhibits no tenderness.  Abdominal: Soft. Bowel sounds are normal. She exhibits no distension and no mass. There is no hepatosplenomegaly. There is no tenderness. There is no rebound, no guarding, no CVA tenderness, no tenderness at McBurney's point and negative Murphy's sign.  Musculoskeletal: Normal range of motion.  Neurological: She is alert and oriented to person, place, and time. Coordination normal.  Skin: Skin is warm and dry. No rash noted. No erythema. No pallor.    ED Course  Procedures   Results for orders placed during the hospital encounter of 03/01/11  COMPREHENSIVE METABOLIC PANEL      Component Value Range   Sodium 139  135 - 145 (mEq/L)   Potassium 4.0  3.5 - 5.1 (mEq/L)   Chloride 104  96 - 112 (mEq/L)   CO2 23  19 - 32 (mEq/L)   Glucose, Bld 104 (*) 70 - 99 (mg/dL)   BUN 14  6 - 23 (mg/dL)   Creatinine, Ser 4.09  0.50 - 1.10 (mg/dL)   Calcium 9.9  8.4 - 81.1 (mg/dL)   Total Protein 7.8  6.0 - 8.3 (g/dL)   Albumin 4.2  3.5 - 5.2 (g/dL)   AST 914 (*) 0 - 37 (U/L)   ALT 254 (*) 0 - 35 (U/L)   Alkaline Phosphatase 184 (*) 39 - 117 (U/L)   Total Bilirubin 0.9  0.3 - 1.2 (mg/dL)   GFR calc non Af Amer 80 (*) >90 (mL/min)   GFR calc Af Amer >90  >90 (mL/min)  LIPASE, BLOOD      Component Value Range   Lipase 187 (*) 11 - 59 (U/L)  TROPONIN I      Component Value Range   Troponin I <0.30  <0.30 (ng/mL)  CBC      Component Value Range   WBC 10.0  4.0 - 10.5 (K/uL)   RBC 4.44  3.87 - 5.11 (MIL/uL)   Hemoglobin 13.3  12.0 - 15.0 (g/dL)   HCT 78.2  95.6 - 21.3 (%)   MCV 88.7  78.0 - 100.0 (fL)   MCH 30.0  26.0 - 34.0 (pg)   MCHC 33.8  30.0 - 36.0 (g/dL)   RDW 08.6   57.8 - 46.9 (%)  Platelets 314  150 - 400 (K/uL)  VALPROIC ACID LEVEL      Component Value Range   Valproic Acid Lvl 29.6 (*) 50.0 - 100.0 (ug/mL)   Dg Chest 2 View  03/01/2011  *RADIOLOGY REPORT*  Clinical Data: Chest pain.  CHEST - 2 VIEW  Comparison: Chest x-ray 06/28/2009.  Findings: The cardiac silhouette, mediastinal and hilar contours are within normal limits.  The lungs are clear.  No pleural effusion.  The bony thorax is intact.  IMPRESSION: No acute cardiopulmonary findings.  Original Report Authenticated By: P. Loralie Champagne, M.D.   Dg Chest 2 View  02/22/2011  *RADIOLOGY REPORT*  Clinical Data:  Chest pain, diaphoresis and nausea.  CHEST - 2 VIEW  Comparison:  Findings: The heart size and mediastinal contours are within normal limits.  Both lungs are clear.  The visualized skeletal structures are unremarkable.  IMPRESSION: No active disease.  Original Report Authenticated By: Reola Calkins, M.D.   Ct Abdomen Pelvis W Contrast  03/02/2011  *RADIOLOGY REPORT*  Clinical Data: Elevated liver function tests.  Elevated lipase. Epigastric pain.  CT ABDOMEN AND PELVIS WITH CONTRAST  Technique:  Multidetector CT imaging of the abdomen and pelvis was performed following the standard protocol during bolus administration of intravenous contrast.  Contrast: 1 OMNIPAQUE IOHEXOL 300 MG/ML IV SOLN, OMNIPAQUE IOHEXOL 300 MG/ML IV SOLN  Comparison: None.  Findings: Dependent atelectasis at the lung bases.  Fatty liver. Cholecystectomy with post cholecystectomy dilation of the common bile duct.  No calcified gallstones are identified.  The spleen appears normal.  Both adrenal glands are normal.  The pancreas is normal without evidence of pancreatitis or peripancreatic phlegmon.  The stomach is within normal limits.  Duodenum normal.  Normal renal enhancement.  There is a 12 mm left upper pole exophytic renal cyst.  Both ureters appear normal. Normal appendix identified in the right lower quadrant.   Colonic diverticulosis without diverticulitis.  Abdominal vasculature appears normal.  Urinary bladder normal.  Uterus and adnexa appear within normal limits.  No free fluid or free air.  No aggressive osseous lesions are identified.  IMPRESSION:  1.  Cholecystectomy with post cholecystectomy dilation of the common bile duct. 2.  Fatty liver. 3.  Left upper pole renal cyst. 4.  Colonic diverticulosis without diverticulitis. 5.  No acute abnormality. 6.  No pancreatitis.  Original Report Authenticated By: Andreas Newport, M.D.     MDM     Advised close followup. Patient states she has a followup appointment with Dr. Cleta Alberts tomorrow morning at 7:30 AM. Advise discussing elevated liver enzymes and CT finding of a fatty liver. Will also give patient to urology later Pulmicort inhaler. Patient and family agree plan and are ready for discharge     Abe People, Cordelia Poche 03/02/11 1478

## 2011-03-01 NOTE — ED Notes (Signed)
Back from radiology.

## 2011-03-01 NOTE — ED Notes (Signed)
NAD, calm, updated, neb complete, drinking PO contrast.

## 2011-03-01 NOTE — ED Provider Notes (Signed)
Patient medically screened in triage by me. Acute onset of chest pain today at 3:00 in the afternoon substernal constant sharp in nature nonradiating associated with nausea no vomiting did have some nausea prior to the onset of chest pain did have one episode of diarrhea at 12 noon.   Cardiac risk factors include diabetes and high cholesterol.  Michelle Torres had different substernal chest pain that occurred on January 3 she was admitted full workup and rule out to include stress testing cardiology consult referred back for followup by her primary care provider. It appears that the risks for sitting cardiac disease was minimal based on the followup patient was started on a daily aspirin.  Today's chest pains started at 3:00 in the afternoon which means been ongoing for 4 hours currently she will need at least a cardiac marker beyond the six-hour mark first troponin ordered chest x-ray ordered aspirin given morphine given for pain due to the pain being somewhat epigastric in nature but metabolic panel lipase and CBC ordered as well.  The first cardiac marker is negative then she'll need a second cardiac marker troponin sometime after 9:00 PM.    Date: 03/01/2011  Rate: 59  Rhythm: sinus bradycardia  QRS Axis: normal  Intervals: normal  ST/T Wave abnormalities: normal  Conduction Disutrbances:none  Narrative Interpretation:   Old EKG Reviewed: unchanged No change in EKG from 06/28/2009 not sure where more recent EKG from recent admission is.    Based on normal EKG unlikely significant cardiac event however markers due to her risk factors are important.  The patient will need to be moved to [place] side ED for further evaluation.     Shelda Jakes, MD 03/01/11 [phone]

## 2011-03-02 ENCOUNTER — Encounter (HOSPITAL_COMMUNITY): Payer: Self-pay | Admitting: Physician Assistant

## 2011-03-02 ENCOUNTER — Telehealth: Payer: Self-pay | Admitting: Gastroenterology

## 2011-03-02 ENCOUNTER — Inpatient Hospital Stay (HOSPITAL_COMMUNITY)
Admission: AD | Admit: 2011-03-02 | Discharge: 2011-03-05 | DRG: 208 | Disposition: A | Discharge status: Home / Self Care | Payer: BC Managed Care – PPO | Source: Ambulatory Visit | Attending: Family Medicine | Admitting: Family Medicine

## 2011-03-02 ENCOUNTER — Ambulatory Visit (INDEPENDENT_AMBULATORY_CARE_PROVIDER_SITE_OTHER): Payer: Self-pay

## 2011-03-02 DIAGNOSIS — E119 Type 2 diabetes mellitus without complications: Secondary | ICD-10-CM

## 2011-03-02 DIAGNOSIS — R079 Chest pain, unspecified: Secondary | ICD-10-CM

## 2011-03-02 DIAGNOSIS — R109 Unspecified abdominal pain: Secondary | ICD-10-CM

## 2011-03-02 DIAGNOSIS — K219 Gastro-esophageal reflux disease without esophagitis: Secondary | ICD-10-CM | POA: Diagnosis present

## 2011-03-02 DIAGNOSIS — F319 Bipolar disorder, unspecified: Secondary | ICD-10-CM | POA: Diagnosis present

## 2011-03-02 DIAGNOSIS — F3181 Bipolar II disorder: Secondary | ICD-10-CM | POA: Diagnosis present

## 2011-03-02 DIAGNOSIS — E1165 Type 2 diabetes mellitus with hyperglycemia: Secondary | ICD-10-CM | POA: Diagnosis present

## 2011-03-02 DIAGNOSIS — K7689 Other specified diseases of liver: Secondary | ICD-10-CM

## 2011-03-02 DIAGNOSIS — R933 Abnormal findings on diagnostic imaging of other parts of digestive tract: Secondary | ICD-10-CM

## 2011-03-02 DIAGNOSIS — D649 Anemia, unspecified: Secondary | ICD-10-CM | POA: Diagnosis not present

## 2011-03-02 DIAGNOSIS — Z794 Long term (current) use of insulin: Secondary | ICD-10-CM

## 2011-03-02 DIAGNOSIS — R822 Biliuria: Secondary | ICD-10-CM

## 2011-03-02 DIAGNOSIS — R7989 Other specified abnormal findings of blood chemistry: Secondary | ICD-10-CM | POA: Diagnosis present

## 2011-03-02 DIAGNOSIS — E7800 Pure hypercholesterolemia, unspecified: Secondary | ICD-10-CM | POA: Diagnosis present

## 2011-03-02 DIAGNOSIS — R1084 Generalized abdominal pain: Secondary | ICD-10-CM

## 2011-03-02 DIAGNOSIS — Z7982 Long term (current) use of aspirin: Secondary | ICD-10-CM

## 2011-03-02 DIAGNOSIS — K838 Other specified diseases of biliary tract: Principal | ICD-10-CM | POA: Diagnosis present

## 2011-03-02 DIAGNOSIS — R1013 Epigastric pain: Secondary | ICD-10-CM | POA: Diagnosis present

## 2011-03-02 DIAGNOSIS — K760 Fatty (change of) liver, not elsewhere classified: Secondary | ICD-10-CM | POA: Diagnosis present

## 2011-03-02 DIAGNOSIS — R932 Abnormal findings on diagnostic imaging of liver and biliary tract: Secondary | ICD-10-CM | POA: Diagnosis present

## 2011-03-02 HISTORY — DX: Fatty (change of) liver, not elsewhere classified: K76.0

## 2011-03-02 LAB — GLUCOSE, CAPILLARY

## 2011-03-02 LAB — CBC
MCHC: 33.5 g/dL (ref 30.0–36.0)
Platelets: 308 10*3/uL (ref 150–400)
RDW: 13.7 % (ref 11.5–15.5)

## 2011-03-02 LAB — PROTIME-INR
INR: 1 (ref 0.00–1.49)
Prothrombin Time: 13.4 seconds (ref 11.6–15.2)

## 2011-03-02 MED ORDER — AMPICILLIN-SULBACTAM SODIUM 1.5 (1-0.5) G IJ SOLR
1.5000 g | Freq: Once | INTRAMUSCULAR | Status: DC
  Filled 2011-03-02: qty 1.5

## 2011-03-02 MED ORDER — INSULIN GLARGINE 100 UNIT/ML ~~LOC~~ SOLN
20.0000 [IU] | Freq: Every day | SUBCUTANEOUS | Status: DC
  Administered 2011-03-02 – 2011-03-04 (×3): 20 [IU] via SUBCUTANEOUS
  Filled 2011-03-02: qty 3

## 2011-03-02 MED ORDER — CALCIUM CARBONATE-VITAMIN D 500-200 MG-UNIT PO TABS
1.0000 | ORAL_TABLET | Freq: Every day | ORAL | Status: DC
  Administered 2011-03-02 – 2011-03-05 (×4): 1 via ORAL
  Filled 2011-03-02 (×4): qty 1

## 2011-03-02 MED ORDER — SERTRALINE HCL 25 MG PO TABS
25.0000 mg | ORAL_TABLET | Freq: Every day | ORAL | Status: DC
  Administered 2011-03-02 – 2011-03-05 (×4): 25 mg via ORAL
  Filled 2011-03-02 (×4): qty 1

## 2011-03-02 MED ORDER — OXCARBAZEPINE 150 MG PO TABS
150.0000 mg | ORAL_TABLET | Freq: Every day | ORAL | Status: DC
  Administered 2011-03-02 – 2011-03-04 (×3): 150 mg via ORAL
  Filled 2011-03-02 (×4): qty 1

## 2011-03-02 MED ORDER — ALBUTEROL SULFATE HFA 108 (90 BASE) MCG/ACT IN AERS: 1.0000 | INHALATION_SPRAY | Freq: Four times a day (QID) | RESPIRATORY_TRACT | Status: DC | PRN

## 2011-03-02 MED ORDER — GABAPENTIN 600 MG PO TABS
600.0000 mg | ORAL_TABLET | Freq: Every day | ORAL | Status: DC
  Administered 2011-03-02 – 2011-03-04 (×3): 600 mg via ORAL
  Filled 2011-03-02 (×4): qty 1

## 2011-03-02 MED ORDER — ASPIRIN 325 MG PO TABS
325.0000 mg | ORAL_TABLET | Freq: Every day | ORAL | Status: DC
  Administered 2011-03-03 – 2011-03-05 (×3): 325 mg via ORAL
  Filled 2011-03-02 (×3): qty 1

## 2011-03-02 MED ORDER — DIVALPROEX SODIUM ER 500 MG PO TB24
500.0000 mg | ORAL_TABLET | Freq: Every day | ORAL | Status: DC
  Administered 2011-03-02 – 2011-03-04 (×3): 500 mg via ORAL
  Filled 2011-03-02 (×4): qty 1

## 2011-03-02 MED ORDER — MORPHINE SULFATE 2 MG/ML IJ SOLN
1.0000 mg | INTRAMUSCULAR | Status: DC | PRN
  Administered 2011-03-03: 1 mg via INTRAVENOUS
  Filled 2011-03-02 (×2): qty 1

## 2011-03-02 MED ORDER — CLONAZEPAM 1 MG PO TABS
3.0000 mg | ORAL_TABLET | Freq: Every day | ORAL | Status: DC
  Administered 2011-03-02 – 2011-03-04 (×3): 3 mg via ORAL
  Filled 2011-03-02 (×3): qty 3

## 2011-03-02 MED ORDER — ALBUTEROL SULFATE (5 MG/ML) 0.5% IN NEBU: 2.5000 mg | INHALATION_SOLUTION | RESPIRATORY_TRACT | Status: DC | PRN

## 2011-03-02 MED ORDER — PREGABALIN 75 MG PO CAPS
75.0000 mg | ORAL_CAPSULE | Freq: Every day | ORAL | Status: DC
  Administered 2011-03-02 – 2011-03-05 (×4): 75 mg via ORAL
  Filled 2011-03-02 (×4): qty 1

## 2011-03-02 MED ORDER — ONDANSETRON HCL 4 MG PO TABS: 4.0000 mg | ORAL_TABLET | Freq: Four times a day (QID) | ORAL | Status: DC | PRN

## 2011-03-02 MED ORDER — INSULIN ASPART 100 UNIT/ML ~~LOC~~ SOLN
0.0000 [IU] | Freq: Three times a day (TID) | SUBCUTANEOUS | Status: DC
  Administered 2011-03-02: 3 [IU] via SUBCUTANEOUS
  Administered 2011-03-03: 2 [IU] via SUBCUTANEOUS
  Administered 2011-03-04 (×2): 1 [IU] via SUBCUTANEOUS
  Administered 2011-03-05: 2 [IU] via SUBCUTANEOUS
  Filled 2011-03-02: qty 3

## 2011-03-02 MED ORDER — SODIUM CHLORIDE 0.9 % IV SOLN
INTRAVENOUS | Status: DC
  Administered 2011-03-02 – 2011-03-03 (×3): via INTRAVENOUS

## 2011-03-02 MED ORDER — HYDROCODONE-ACETAMINOPHEN 5-325 MG PO TABS: 1.0000 | ORAL_TABLET | Freq: Four times a day (QID) | ORAL | Status: DC | PRN

## 2011-03-02 MED ORDER — ONDANSETRON HCL 4 MG/2ML IJ SOLN: 4.0000 mg | Freq: Four times a day (QID) | INTRAMUSCULAR | Status: DC | PRN

## 2011-03-02 MED ORDER — ONDANSETRON 8 MG PO TBDP: 8.0000 mg | ORAL_TABLET | Freq: Three times a day (TID) | ORAL | Status: DC | PRN

## 2011-03-02 MED ORDER — SODIUM CHLORIDE 0.9 % IV SOLN
1.5000 g | Freq: Once | INTRAVENOUS | Status: AC
  Administered 2011-03-03: 1.5 g via INTRAVENOUS
  Filled 2011-03-02: qty 1.5

## 2011-03-02 MED ORDER — TOPIRAMATE 100 MG PO TABS
100.0000 mg | ORAL_TABLET | Freq: Every day | ORAL | Status: DC
  Administered 2011-03-02 – 2011-03-04 (×3): 100 mg via ORAL
  Filled 2011-03-02 (×4): qty 1

## 2011-03-02 MED ORDER — BUDESONIDE-FORMOTEROL FUMARATE 160-4.5 MCG/ACT IN AERO: 2.0000 | INHALATION_SPRAY | Freq: Two times a day (BID) | RESPIRATORY_TRACT | Status: DC

## 2011-03-02 NOTE — Consult Note (Signed)
I have seen the pt and agree with S.Gribbin's PA note and recommendations. Pt had a dilated CBD in 2008 (12 mm), similar abd.pain 19 years ago after she delivered her daughter, she underwent uncomplicated lap chole ( no T-tube).She has had fatty liver on prior imaging, which could account for some of her  LFT elevation. But the RUQ abd. pain is more consistent with biliary colic due to extrahepatic biliary obstruction. I have discussed different options with the pt and her husband: pancreatitis, spasm of the sphincter of Oddi, retained or passed stone PUD. Scheduled for ERCP, possible endoscopic sphincterotomy, repeat LFT's in am,

## 2011-03-02 NOTE — Telephone Encounter (Signed)
Reviewed records.  LFTs signficantly up, also lipase elevation.  Probable biliary pancreatitis, needs to be admitted to hosp and inpatient GI consultation for possible ERCP.

## 2011-03-02 NOTE — H&P (Signed)
Michelle Torres is an 51 y.o. female.   H&P Note Family Medicine Teaching Service Service Pager: [phone]  Chief Complaint: Abdominal pain HPI: Patient presents as a direct admission from [facility] and Urgent care for a chief complaint of abdominal pain, likely secondary to a common bile duct obstruction. Patient has been evaluated in the ED for chest pain twice; once last week as well as last night. Yesterday at 10am, patient was at work and started feeling light headed. By noon, she was having nausea and decreased PO intake. (Had breakfast which she tolerated well.) Soon afterwards, she started having diarrhea.. At 3:00pm, patient reports having stabbing chest pain and came to ED at [facility]. At that time, she was also having SOB and pain in chest with inspiration. She describes that pain as substernal with no radiation. She had full workup yesterday in ED and was found to have elevated LFTs as well as increased lipase. She was discharged home after she received Morphine, Zofran and Albuterol breathing treatments and she was feeling better. She states she was able to eat something when she got home, then went to [place] this morning for further evaluation of elevated LFT's since her pain was not cardiac. Now having tenderness in abdomen mostly in epigastric area. Does not radiate to back. Pain in chest is currently 1/10. Patient has a history of gallstones with Cholecystectomy in 12/1991, but she has not had any other abdominal surgery.   Of note, patient reports increased CBGs in the last 6 weeks. (300-400) Increased insulin at night to help with AM blood sugars. Morning CBG around 140's.   Patient was seen at [facility] this morning. Dr. Cleta Alberts was concerned this could be a stone in the common bile duct since she has elevated LFTs, lipase and dilation seen on CT scan. He contacted [facility] who was also in the room with the patient during this H&P.  ROS: + diarrhea (for 6 weeks, no BM today),  +bloated feeling, +bruising - fevers, - nausea currently, - vomiting, - blood in stool, - SOB now, - jaundice, -dysuria, - joint pain  Past Medical History  Diagnosis Date  . Diabetes mellitus   . Bipolar disorder   . High cholesterol   Remote history of GERD  Past Surgical History  Procedure Date  . Cholecystectomy     No family history on file. Social History: Lives with Daphine Deutscher (husband) and son. No smoking. One alcoholic drink every month. No drug use. Works at [employer].   Allergies:  Allergies  Allergen Reactions  . Risperidone And Related Other (See Comments)    liver damage   Prior to Admission medications   Medication Sig Start Date End Date Taking? Authorizing Provider  albuterol (PROVENTIL HFA;VENTOLIN HFA) 108 (90 BASE) MCG/ACT inhaler Inhale 1-2 puffs into the lungs every 6 (six) hours as needed for wheezing. 03/02/11 03/01/12  Abe People, PA-C  aspirin 325 MG tablet Take 325 mg by mouth daily.      Historical Provider, MD  budesonide-formoterol (SYMBICORT) 160-4.5 MCG/ACT inhaler Inhale 2 puffs into the lungs 2 (two) times daily. 03/02/11 03/01/12  Abe People, PA-C  Calcium Carbonate-Vitamin D (OSCAL 500/200 D-3 PO) Take 1 tablet by mouth daily.      Historical Provider, MD  clonazePAM (KLONOPIN) 1 MG tablet Take 3 mg by mouth at bedtime.      Historical Provider, MD  divalproex (DEPAKOTE ER) 500 MG 24 hr tablet Take 500 mg by mouth at  bedtime.      Historical Provider, MD  gabapentin (NEURONTIN) 600 MG tablet Take 600 mg by mouth at bedtime.      Historical Provider, MD  HYDROcodone-acetaminophen (NORCO) 5-325 MG per tablet Take 1-2 tablets by mouth every 6 (six) hours as needed for pain. 03/02/11 03/12/11  Abe People, PA-C  insulin glargine (LANTUS) 100 UNIT/ML injection Inject 36 Units into the skin at bedtime.      Historical Provider, MD  lamivudine (EPIVIR) 100 MG tablet Take 150 mg by mouth daily.      Historical Provider, MD  metFORMIN  (GLUCOPHAGE) 1000 MG tablet Take 1,000 mg by mouth 2 (two) times daily with a meal.      Historical Provider, MD  naproxen sodium (ANAPROX) 220 MG tablet Take 440 mg by mouth 2 (two) times daily with a meal. For pain     Historical Provider, MD  ondansetron (ZOFRAN ODT) 8 MG disintegrating tablet Take 1 tablet (8 mg total) by mouth every 8 (eight) hours as needed for nausea. 03/02/11 03/09/11  Abe People, PA-C  OXcarbazepine (TRILEPTAL) 150 MG tablet Take 150 mg by mouth at bedtime.      Historical Provider, MD  pregabalin (LYRICA) 75 MG capsule Take 75 mg by mouth daily.      Historical Provider, MD  sertraline (ZOLOFT) 25 MG tablet Take 25 mg by mouth daily.      Historical Provider, MD  topiramate (TOPAMAX) 100 MG tablet Take 100 mg by mouth at bedtime.      Historical Provider, MD     Results for orders placed during the hospital encounter of 03/01/11 (from the past 48 hour(s))  COMPREHENSIVE METABOLIC PANEL     Status: Abnormal   Collection Time   03/01/11  7:27 PM      Component Value Range Comment   Sodium 139  135 - 145 (mEq/L)    Potassium 4.0  3.5 - 5.1 (mEq/L)    Chloride 104  96 - 112 (mEq/L)    CO2 23  19 - 32 (mEq/L)    Glucose, Bld 104 (*) 70 - 99 (mg/dL)    BUN 14  6 - 23 (mg/dL)    Creatinine, Ser 4.09  0.50 - 1.10 (mg/dL)    Calcium 9.9  8.4 - 10.5 (mg/dL)    Total Protein 7.8  6.0 - 8.3 (g/dL)    Albumin 4.2  3.5 - 5.2 (g/dL)    AST 811 (*) 0 - 37 (U/L)    ALT 254 (*) 0 - 35 (U/L)    Alkaline Phosphatase 184 (*) 39 - 117 (U/L)    Total Bilirubin 0.9  0.3 - 1.2 (mg/dL)    GFR calc non Af Amer 80 (*) >90 (mL/min)    GFR calc Af Amer >90  >90 (mL/min)   LIPASE, BLOOD     Status: Abnormal   Collection Time   03/01/11  7:27 PM      Component Value Range Comment   Lipase 187 (*) 11 - 59 (U/L)   TROPONIN I     Status: Normal   Collection Time   03/01/11  7:27 PM      Component Value Range Comment   Troponin I <0.30  <0.30 (ng/mL)   CBC     Status: Normal    Collection Time   03/01/11  7:27 PM      Component Value Range Comment   WBC 10.0  4.0 - 10.5 (K/uL)    RBC  4.44  3.87 - 5.11 (MIL/uL)    Hemoglobin 13.3  12.0 - 15.0 (g/dL)    HCT 40.9  81.1 - 91.4 (%)    MCV 88.7  78.0 - 100.0 (fL)    MCH 30.0  26.0 - 34.0 (pg)    MCHC 33.8  30.0 - 36.0 (g/dL)    RDW 78.2  95.6 - 21.3 (%)    Platelets 314  150 - 400 (K/uL)   VALPROIC ACID LEVEL     Status: Abnormal   Collection Time   03/01/11 11:13 PM      Component Value Range Comment   Valproic Acid Lvl 29.6 (*) 50.0 - 100.0 (ug/mL)    Dg Chest 2 View  03/01/2011  *RADIOLOGY REPORT*  Clinical Data: Chest pain.  CHEST - 2 VIEW  Comparison: Chest x-ray 06/28/2009.  Findings: The cardiac silhouette, mediastinal and hilar contours are within normal limits.  The lungs are clear.  No pleural effusion.  The bony thorax is intact.  IMPRESSION: No acute cardiopulmonary findings.  Original Report Authenticated By: P. Loralie Champagne, M.D.   Ct Abdomen Pelvis W Contrast  03/02/2011  *RADIOLOGY REPORT*  Clinical Data: Elevated liver function tests.  Elevated lipase. Epigastric pain.  CT ABDOMEN AND PELVIS WITH CONTRAST  Technique:  Multidetector CT imaging of the abdomen and pelvis was performed following the standard protocol during bolus administration of intravenous contrast.  Contrast: 1 OMNIPAQUE IOHEXOL 300 MG/ML IV SOLN, OMNIPAQUE IOHEXOL 300 MG/ML IV SOLN  Comparison: None.  Findings: Dependent atelectasis at the lung bases.  Fatty liver. Cholecystectomy with post cholecystectomy dilation of the common bile duct.  No calcified gallstones are identified.  The spleen appears normal.  Both adrenal glands are normal.  The pancreas is normal without evidence of pancreatitis or peripancreatic phlegmon.  The stomach is within normal limits.  Duodenum normal.  Normal renal enhancement.  There is a 12 mm left upper pole exophytic renal cyst.  Both ureters appear normal. Normal appendix identified in the right lower  quadrant.  Colonic diverticulosis without diverticulitis.  Abdominal vasculature appears normal.  Urinary bladder normal.  Uterus and adnexa appear within normal limits.  No free fluid or free air.  No aggressive osseous lesions are identified.  IMPRESSION:  1.  Cholecystectomy with post cholecystectomy dilation of the common bile duct. 2.  Fatty liver. 3.  Left upper pole renal cyst. 4.  Colonic diverticulosis without diverticulitis. 5.  No acute abnormality. 6.  No pancreatitis.  Original Report Authenticated By: Andreas Newport, M.D.    ROS Please see HPI above  Blood pressure 92/67, pulse 66, temperature 97.4 F (36.3 C), temperature source Oral, resp. rate 18, SpO2 95.00%. Physical Exam  Constitutional: She is oriented to person, place, and time. She appears well-developed and well-nourished. No distress.  HENT:  Head: Normocephalic and atraumatic.  Cardiovascular: Normal rate and regular rhythm.   No murmur heard. Respiratory: Effort normal. She has no wheezes.  GI: Soft. Bowel sounds are normal. There is tenderness (Mild epigastric tenderness to palpation).  Musculoskeletal: Normal range of motion. She exhibits no edema.  Lymphadenopathy:    She has no cervical adenopathy.  Neurological: She is alert and oriented to person, place, and time. No cranial nerve deficit.  Skin: Skin is warm and dry. No rash noted.  Psychiatric: She has a normal mood and affect. Her behavior is normal.     Assessment/Plan 51 yo F p/w abdominal pain, as well as elevated LFT/lipase and CT abdomen concerning  for retained common duct stone  1. Abdominal Pain: Concern for common bile duct stone given her labs as well as the dilatation seen on CT scan  - [facility] consulted, and they have already seen the patient.  - ERCP tomorrow. Will allow patient to eat tonight, then NPO after dinner in preparation for surgery.  - GI asked for amylase, PT/INR for baseline labs. These were ordered as well as a CBC.  -  Repeat labs in the morning  - NS@125cc /hr. If patient has hypoglycemia due to NPO, will add small amount of D5 to fluids.  - Will give Morphine PRN for pain  2. DM: Recently increased blood sugars reported at home. Holding patient's home Metformin at this time  - A1c pending  - Moderate scale SSI  - Will give Lantus 20U (home dose is 36U) tonight.  - Continue to monitor CBG  3. Bipolar disorder: Stable. Patient is followed by behavioral health as an outpatient.   - Continue home medications  - Noted to have low Valproic acid level. We are not adjusting this at this time.  4. FEN/GI: clears tonight, then NPO after midnight. IVF: NS @ 125cc/hr  5. Ppx: SCDs as pt is to be undergoing procedure  6. Dispo: Pending clinical improvement  Kathrina Crosley 03/02/2011, 12:42 PM    PGY-2 ADDENDUM I have seen and examined the patient and agree with excellent PGY-1 note by Dr. Mikel Cella.  For full details, please see above H&P.  Briefly, this is a 51 year old female direct admit from [place] for lab abnormalities found when she went to the ED last night for chest pain.  Chest pain has resolved but she continues to have some abdominal discomfort and nausea.  She was able to eat a small amount early this morning when she got home from the ED.  Lab abnormalities include elevated AST/ALT 452/184, lipase 187, alk phos 184.  On CT, there is a dilated common duct which may be due to a retained stone.  Patient had a cholecystectomy 19 years ago which was performed for gallstones.  She has not had any fevers or vomiting.  She has been having diarrhea for the past few weeks.  In 10/2009, pt had normal LFTs, but they were found to be elevated 01/05/2011 (AST/ALT 143/11, Alk phos 114, t bili 0.4, A1c 8.1%).  Most recent FLP 02/22/2011 showed Chol 194, TG 210, LDL 106.  Patient was discussed with [facility] by Dr. Cleta Alberts and sent to be admitted by medicine team for possible ERCP for this question of retained common  duct stone  O:  Filed Vitals:   03/02/11 1221  BP: 92/67  Pulse: 66  Temp: 97.4 F (36.3 C)  Resp: 18   Gen: NAD, very pleasant HEENT: MMM, no pharyngeal erythema CV: RRR, no murmur Abd: soft, diffuse TTP greatest at epigastric and RUQ, no rebound, normal bowel sounds throughout Ext: warm, no edema  Labs: see above  A/P: 51 yo F p/w abnormal labs and CT abdomen concerning for retained common duct stone s/p cholecystectomy 19 years ago  1. GI: Based on CT and labs, concern from PCP and GI is retained stone caused common duct dilitation -[facility] has been formally consulted, will follow up their recs: ERCP either today vs tomorrow morning depending on staffing -Will add on CBC, amylase and INR per request -Repeat labs in the morning -NPO for possible procedure today, fluids for hydration -Morphine PRN pain   2. DM: Will hold  patient's home lantus and metformin -Place on SSI -Since patient is NPO, consider adding D5 to fluids if she becomes hypoglycemic, although less likely without long acting insulin on board   3. Bipolar disorder: Continue home medications -Patient with normal affect at this time  4. FEN/GI: NPO for possible procedure, will allow pt carb mod diet if procedure will not be until tomorrow and then make NPO after MN, IVF: NS @ 125cc/hr  5. Ppx: SCDs as pt is to be undergoing procedure  6. Dispo: admit to floor bed, d/c pending GI workup    MCGILL,JACQUELYN 3:39 PM 03/02/2011

## 2011-03-02 NOTE — ED Provider Notes (Signed)
Medical screening examination/treatment/procedure(s) were performed by non-physician practitioner and as supervising physician I was immediately available for consultation/collaboration.   Shai Mckenzie L Aneesah Hernan, MD 03/02/11 1450 

## 2011-03-02 NOTE — Consult Note (Signed)
[facility] Consult: 1:27 PM 03/02/2011   Referring Provider: Dr. Cleta Alberts, Dr Sheffield Slider  Primary Care Physician:  Lucilla Edin, MD, MD Primary Gastroenterologist: Dr. Christella Hartigan   Reason for Consultation:  Choledocholithiasis?  HPI: Michelle Torres is a 51 y.o. female.  Has IDDM. Has controlled bipolar disorder. Lap chole in 1993.  Seen in [address]109 re: fatty liver and elevated transaminases.   Last week felt unwell with malaise, then non-exertional chest pain.  Spent many hours in ED wed/thursday last week.  Ruled out MI after stress echo and negative enzymes.  No LFTs drawn.  Chest pain resoled after a few hours.  For the nest several days she still has a bit of malaise but returned to work.  Caught a cold earlier this week: sinus congestion and headache.  Had recurrent non-exertional ss chest pain without radiation.  No nausea/vomitting.  Returned to ED:  LFTs elevated, though bilirubin is normal.   Abdominl CT showing dilated CBD, not beyond expectation for post cholecystectomy pt.  She was released to home and primary MD followup.  Note that the common bile duct was 12mm by [place] in 2008.  Dr Cleta Alberts called dr. Christella Hartigan.  Inpt admission to [facility] arranged.  She is currently comfortable.   No fatty food intolerance.  No gerd symptoms.  Stools not loose.  Sugars have run higher in last 6 weeks though pt activity and diet have not changed.  No fever or chills.   Takes daily ASA325, prn excedrin migraine and napoxen a few times per month for headaches.  Last used these early this week.     Past Medical History  Diagnosis Date  . Diabetes mellitus   . Bipolar disorder   . High cholesterol   . Fatty liver disease, nonalcoholic     Past Surgical History  Procedure Date  . Laparoscopic cholecystectomy 1993    Prior to Admission medications   Medication Sig Start Date End Date Taking? Authorizing Provider  albuterol (PROVENTIL HFA;VENTOLIN HFA) 108 (90 BASE)  MCG/ACT inhaler Inhale 1-2 puffs into the lungs every 6 (six) hours as needed for wheezing. 03/02/11 03/01/12  Abe People, PA-C  aspirin 325 MG tablet Take 325 mg by mouth daily.      Historical Provider, MD  budesonide-formoterol (SYMBICORT) 160-4.5 MCG/ACT inhaler Inhale 2 puffs into the lungs 2 (two) times daily. 03/02/11 03/01/12  Abe People, PA-C  Calcium Carbonate-Vitamin D (OSCAL 500/200 D-3 PO) Take 1 tablet by mouth daily.      Historical Provider, MD  clonazePAM (KLONOPIN) 1 MG tablet Take 3 mg by mouth at bedtime.      Historical Provider, MD  divalproex (DEPAKOTE ER) 500 MG 24 hr tablet Take 500 mg by mouth at bedtime.      Historical Provider, MD  gabapentin (NEURONTIN) 600 MG tablet Take 600 mg by mouth at bedtime.      Historical Provider, MD  HYDROcodone-acetaminophen (NORCO) 5-325 MG per tablet Take 1-2 tablets by mouth every 6 (six) hours as needed for pain. 03/02/11 03/12/11  Abe People, PA-C  insulin glargine (LANTUS) 100 UNIT/ML injection Inject 36 Units into the skin at bedtime.      Historical Provider, MD  lamivudine (EPIVIR) 100 MG tablet Take 150 mg by mouth daily.      Historical Provider, MD  metFORMIN (GLUCOPHAGE) 1000 MG tablet Take 1,000 mg by mouth 2 (two) times daily with a meal.      Historical Provider, MD  naproxen sodium (ANAPROX)  220 MG tablet Take 440 mg by mouth 2 (two) times daily with a meal. For pain     Historical Provider, MD  ondansetron (ZOFRAN ODT) 8 MG disintegrating tablet Take 1 tablet (8 mg total) by mouth every 8 (eight) hours as needed for nausea. 03/02/11 03/09/11  Abe People, PA-C  OXcarbazepine (TRILEPTAL) 150 MG tablet Take 150 mg by mouth at bedtime.      Historical Provider, MD  pregabalin (LYRICA) 75 MG capsule Take 75 mg by mouth daily.      Historical Provider, MD  sertraline (ZOLOFT) 25 MG tablet Take 25 mg by mouth daily.      Historical Provider, MD  topiramate (TOPAMAX) 100 MG tablet Take 100 mg by mouth at  bedtime.      Historical Provider, MD    Scheduled Meds:  No orders yet for meds.    Marland Kitchen ampicillin-sulbactam (UNASYN) IV  1.5 g Intravenous Once  . aspirin  325 mg Oral Daily  . calcium-vitamin D  1 tablet Oral Daily  . clonazePAM  3 mg Oral QHS  . divalproex  500 mg Oral QHS  . gabapentin  600 mg Oral QHS  . insulin aspart  0-9 Units Subcutaneous TID WC  . OXcarbazepine  150 mg Oral QHS  . pregabalin  75 mg Oral Daily  . sertraline  25 mg Oral Daily  . topiramate  100 mg Oral QHS   Infusions:    . sodium chloride     PRN Meds: albuterol, morphine, ondansetron (ZOFRAN) IV, ondansetron   Allergies as of 03/02/2011 - Review Complete 03/01/2011  Allergen Reaction Noted  . Risperidone and related Other (See Comments) 02/22/2011    No family history on file.  History   Social History  . Marital Status: Married    Spouse Name: N/A    Number of Children: N/A  . Years of Education: N/A   Occupational History  . Not on file.   Social History Main Topics  . Smoking status: Never Smoker   . Smokeless tobacco: Not on file  . Alcohol Use: Yes     once every couple months  . Drug Use:   . Sexually Active:    Other Topics Concern  . Not on file   Social History Narrative  . No narrative on file    REVIEW OF SYSTEMS: Constitutional:  Fatigue and malaise.  188# in 2008, 201# today. ENT:  No epistaxis.  Pos for rhinorrhea and cold sxs Pulm:  Some minor cough: no sputum CV:  Chest pain: non-exertional GU:  No dysuria, no tea-like  urine GI:  above Heme:  No excessive bleeding or bruising.    Transfusions:  none Neuro:  Frequent headaches Derm:  No rash, itching, sores Endocrine:  Sugars harder to control in last 6 weeks, running higher than normal Immunization:  Flu shot current. Travel:  None outside the local region.   PHYSICAL EXAM: Vital signs in last 24 hours: Temp:  [97.3 F (36.3 C)-97.6 F (36.4 C)] 97.4 F (36.3 C) (01/11 1221) Pulse Rate:   [56-67] 66  (01/11 1221) Resp:  [16-23] 18  (01/11 1221) BP: (88-118)/(51-67) 92/67 mmHg (01/11 1221) SpO2:  [95 %-100 %] 95 % (01/11 1221) Weight   91.1 General: Obese, looks well. Head:  No signs trauma.  No moon faces  Eyes:  No icterus, no conj pallor Ears:  Not HOH  Nose:  No discharge Mouth:  Good teeth.  Moist and clear MM Neck:  No mass, no JVD, no goiter Lungs:  Clear, no cough Heart: RRR, no mrg, s1/s2audible Abdomen:  Soft with epigastric tenderness but no G orR.  Active BS, ND.  No ascites or HSM.   Rectal: deferred   Musc/Skeltl: no joint swelling. Extremities:  No pedal edema.  Toe cap refill brisk  Neurologic:  Fully oriented, intelligent, no tremor, no gross weakness or deficits Skin:  No rash or sores Tattoos:  none Nodes:  No cervical adenopathy   Psych:  Pleasant, good historian.  Not anxious or depressed.  Intake/Output from previous day:   Intake/Output this shift:    LAB RESULTS:  Basename 03/01/11 1927  WBC 10.0  HGB 13.3  HCT 39.4  PLT 314   BMET Lab Results  Component Value Date   NA 139 03/01/2011   NA 140 02/22/2011   NA 141 06/28/2009   K 4.0 03/01/2011   K 4.2 02/22/2011   K 4.0 06/28/2009   CL 104 03/01/2011   CL 107 02/22/2011   CL 109 06/28/2009   CO2 23 03/01/2011   CO2 24 02/22/2011   GLUCOSE 104* 03/01/2011   GLUCOSE 143* 02/22/2011   GLUCOSE 137* 06/28/2009   BUN 14 03/01/2011   BUN 11 02/22/2011   BUN 16 06/28/2009   CREATININE 0.85 03/01/2011   CREATININE 0.67 02/22/2011   CREATININE 0.8 06/28/2009   CALCIUM 9.9 03/01/2011   CALCIUM 9.5 02/22/2011   LFT Lab Results  Component Value Date   PROT 7.8 03/01/2011   PROT 7.2 05/02/2006   PROT 6.8 04/02/2006   ALBUMIN 4.2 03/01/2011   ALBUMIN 3.6 05/02/2006   ALBUMIN 3.7 04/02/2006   AST 452* 03/01/2011   AST 34 05/02/2006   AST 75* 04/02/2006   ALT 254* 03/01/2011   ALT 45* 05/02/2006   ALT 107* 04/02/2006   ALKPHOS 184* 03/01/2011   ALKPHOS 88 05/02/2006   ALKPHOS 103 04/02/2006   BILITOT 0.9  03/01/2011   BILITOT 0.5 05/02/2006   BILITOT 0.7 04/02/2006   BILIDIR 0.2 05/02/2006   BILIDIR 0.1 04/02/2006   PT/INR Lab Results  Component Value Date   INR 0.95 02/22/2011   INR 1.0 RATIO 04/02/2006   RADIOLOGY STUDIES: Dg Chest 2 View  03/01/2011  *RADIOLOGY REPORT*  Clinical Data: Chest pain.  CHEST - 2 VIEW  Comparison: Chest x-ray 06/28/2009.  Findings: The cardiac silhouette, mediastinal and hilar contours are within normal limits.  The lungs are clear.  No pleural effusion.  The bony thorax is intact.  IMPRESSION: No acute cardiopulmonary findings.  Original Report Authenticated By: P. Loralie Champagne, M.D.   Ct Abdomen Pelvis W Contrast  03/02/2011  *RADIOLOGY REPORT*  Clinical Data: Elevated liver function tests.  Elevated lipase. Epigastric pain.  CT ABDOMEN AND PELVIS WITH CONTRAST  Technique:  Multidetector CT imaging of the abdomen and pelvis was performed following the standard protocol during bolus administration of intravenous contrast.  Contrast: 1 OMNIPAQUE IOHEXOL 300 MG/ML IV SOLN, OMNIPAQUE IOHEXOL 300 MG/ML IV SOLN  Comparison: None.  Findings: Dependent atelectasis at the lung bases.  Fatty liver. Cholecystectomy with post cholecystectomy dilation of the common bile duct.  No calcified gallstones are identified.  The spleen appears normal.  Both adrenal glands are normal.  The pancreas is normal without evidence of pancreatitis or peripancreatic phlegmon.  The stomach is within normal limits.  Duodenum normal.  Normal renal enhancement.  There is a 12 mm left upper pole exophytic renal cyst.  Both ureters appear normal. Normal  appendix identified in the right lower quadrant.  Colonic diverticulosis without diverticulitis.  Abdominal vasculature appears normal.  Urinary bladder normal.  Uterus and adnexa appear within normal limits.  No free fluid or free air.  No aggressive osseous lesions are identified.  IMPRESSION:  1.  Cholecystectomy with post cholecystectomy dilation of  the common bile duct. 2.  Fatty liver. 3.  Left upper pole renal cyst. 4.  Colonic diverticulosis without diverticulitis. 5.  No acute abnormality. 6.  No pancreatitis.  Original Report Authenticated By: Andreas Newport, M.D.    ENDOSCOPIC STUDIES: None ever  IMPRESSION: 1.  Chest pain but tenderness is epigastric, with abnormal LFts, dilation of CBD.  Need to rule out stone or sludge in common duct.   2.  IDDM 3.  S/P lap chole 1993. 4.  Fatty liver, with prior hx mild elevation of LFTs.  Workup included ANA, antismooth muscle antibody, AMA, hepatitis A, B, C, all were negative. 5.  Obesity. 6.  Colonic diverticulosis, found incidentally on CT  PLAN: 1.  ERCP tomorrow.  Discussed testing with risks, benefits with pt and her spouse, they are eager to proceed.   LOS: 0 days   Jennye Moccasin  03/02/2011, 1:27 PM Pager: [phone]

## 2011-03-02 NOTE — H&P (Signed)
This patient was actually seen by Dr. Sheffield Slider as [facility] attending.  I have reviewed the written assessment of Drs Mikel Cella and [address]  There assessment and plan is consistent with the checkout that I received from Dr. Sheffield Slider.

## 2011-03-02 NOTE — Progress Notes (Signed)
Pt arrived as a direct admit from doctor's office. Pt A/O x4 and independently ambulatory. Pt is from home with her husband. Pt skin is intact and pt has no complaints of pain. Pt watched safety video and MD called at patient's arrival. Will continue to monitor.

## 2011-03-02 NOTE — Telephone Encounter (Signed)
Pt had severe chest pain last week seen in ER not cardiac pt had severe abd and chest pain yesterday seen in ER LFT  Spike,  gallbladder has been removed and Dr Cleta Alberts wants pt seen today  for possible stone. She is in there office now.   Dr Christella Hartigan you have no spots and we no extender today.  Please advise.

## 2011-03-02 NOTE — Telephone Encounter (Signed)
Jennye Moccasin called and made aware of pt.  She is to expect a consult today

## 2011-03-03 ENCOUNTER — Encounter (HOSPITAL_COMMUNITY): Payer: Self-pay | Admitting: *Deleted

## 2011-03-03 ENCOUNTER — Encounter (HOSPITAL_COMMUNITY)
Admission: AD | Disposition: A | Discharge status: Home / Self Care | Payer: Self-pay | Source: Ambulatory Visit | Attending: Family Medicine

## 2011-03-03 ENCOUNTER — Inpatient Hospital Stay (HOSPITAL_COMMUNITY): Payer: BC Managed Care – PPO

## 2011-03-03 HISTORY — PX: ERCP: SHX5425

## 2011-03-03 LAB — COMPREHENSIVE METABOLIC PANEL
ALT: 125 U/L — ABNORMAL HIGH (ref 0–35)
AST: 51 U/L — ABNORMAL HIGH (ref 0–37)
Albumin: 2.9 g/dL — ABNORMAL LOW (ref 3.5–5.2)
Alkaline Phosphatase: 118 U/L — ABNORMAL HIGH (ref 39–117)
Potassium: 3.9 mEq/L (ref 3.5–5.1)
Sodium: 144 mEq/L (ref 135–145)
Total Protein: 6.1 g/dL (ref 6.0–8.3)

## 2011-03-03 LAB — CBC
HCT: 33.9 % — ABNORMAL LOW (ref 36.0–46.0)
MCH: 29.1 pg (ref 26.0–34.0)
MCV: 90.4 fL (ref 78.0–100.0)
RDW: 14.1 % (ref 11.5–15.5)
WBC: 10.2 10*3/uL (ref 4.0–10.5)

## 2011-03-03 LAB — GLUCOSE, CAPILLARY
Glucose-Capillary: 56 mg/dL — ABNORMAL LOW (ref 70–99)
Glucose-Capillary: 82 mg/dL (ref 70–99)
Glucose-Capillary: 163 mg/dL — ABNORMAL HIGH (ref 70–99)
Glucose-Capillary: 108 mg/dL — ABNORMAL HIGH (ref 70–99)

## 2011-03-03 LAB — AMYLASE: Amylase: 56 U/L (ref 0–105)

## 2011-03-03 SURGERY — ERCP, WITH INTERVENTION IF INDICATED
Anesthesia: Moderate Sedation

## 2011-03-03 MED ORDER — GLUCAGON HCL (RDNA) 1 MG IJ SOLR
INTRAMUSCULAR | Status: DC | PRN
  Administered 2011-03-03: .5 mg via INTRAVENOUS

## 2011-03-03 MED ORDER — MIDAZOLAM HCL 10 MG/2ML IJ SOLN
INTRAMUSCULAR | Status: DC | PRN
  Administered 2011-03-03 (×3): 2 mg via INTRAVENOUS

## 2011-03-03 MED ORDER — GLUCAGON HCL (RDNA) 1 MG IJ SOLR
INTRAMUSCULAR | Status: AC
  Filled 2011-03-03: qty 2

## 2011-03-03 MED ORDER — BUTAMBEN-TETRACAINE-BENZOCAINE 2-2-14 % EX AERO
INHALATION_SPRAY | CUTANEOUS | Status: DC | PRN
  Administered 2011-03-03: 2 via TOPICAL

## 2011-03-03 MED ORDER — DIPHENHYDRAMINE HCL 50 MG/ML IJ SOLN
INTRAMUSCULAR | Status: DC | PRN
  Administered 2011-03-03 (×2): 25 mg via INTRAVENOUS

## 2011-03-03 MED ORDER — FENTANYL CITRATE 0.05 MG/ML IJ SOLN
INTRAMUSCULAR | Status: DC | PRN
  Administered 2011-03-03 (×3): 25 ug via INTRAVENOUS

## 2011-03-03 MED ORDER — DIPHENHYDRAMINE HCL 50 MG/ML IJ SOLN
INTRAMUSCULAR | Status: AC
  Filled 2011-03-03: qty 1

## 2011-03-03 MED ORDER — FENTANYL CITRATE 0.05 MG/ML IJ SOLN
INTRAMUSCULAR | Status: AC
  Filled 2011-03-03: qty 4

## 2011-03-03 MED ORDER — MIDAZOLAM HCL 10 MG/2ML IJ SOLN
INTRAMUSCULAR | Status: AC
  Filled 2011-03-03: qty 4

## 2011-03-03 NOTE — Progress Notes (Signed)
Pt underwent ERCP, sphincterotomy and sweep of dilated CBD without  Evidence of any retained stones, MPD was also visualized and appeared normal. Her pain this morning was much better and her LFT's have also trended down. I suspect she passed a stone. We plan to advance her diet and see how she does during the day. She could go home later today or in am.- if she stays, she will have repeat LFT's in am. No need for antibiotics at this point. She still has fatty liver which may be causing chronic RUQ discomfort.

## 2011-03-03 NOTE — Progress Notes (Signed)
Subjective: No o/n issues. S/P unremarkable ERCP. No abdominal pain.  Objective: Vital signs in last 24 hours: Temp:  [97.4 F (36.3 C)-98.9 F (37.2 C)] 98.5 F (36.9 C) (01/12 0724) Pulse Rate:  [66-91] 66  (01/12 0724) Resp:  [12-21] 14  (01/12 0920) BP: (92-151)/(47-99) 151/91 mmHg (01/12 0920) SpO2:  [94 %-100 %] 95 % (01/12 0920) Weight change:  Last BM Date: 03/01/11  Intake/Output from previous day: 01/11 0701 - 01/12 0700 In: 240 [P.O.:240] Out: 1 [Urine:1] Intake/Output this shift:   Lungs:  Normal respiratory effort, chest expands symmetrically. Lungs are clear to auscultation, no crackles or wheezes. Heart - Regular rate and rhythm.  No murmurs, gallops or rubs.    Abdomen: soft and non-tender without masses, organomegaly or hernias noted.  No guarding or rebound Extremities:  No cyanosis, edema, or deformity noted   Lab Results:  Basename 03/03/11 0600 03/02/11 1329  WBC 10.2 10.4  HGB 10.9* 12.0  HCT 33.9* 35.8*  PLT 273 308   BMET  Basename 03/03/11 0600 03/01/11 1927  NA 144 139  K 3.9 4.0  CL 111 104  CO2 25 23  GLUCOSE 107* 104*  BUN 12 14  CREATININE 0.78 0.85  CALCIUM 9.0 9.9   Medications: I have reviewed the patient's current medications.  Assessment/Plan: 1. Elevated liver enzymes S/P ERCP. Dr. Juanda Chance believes passage of stone likely with concurrent fatty liver Will get acute hepatitis panel. LFT's and lipase in am if stays  2. DM: Recently increased blood sugars reported at home. Holding patient's home Metformin at this time  - A1c pending  - Moderate scale SSI  - Will give Lantus 20U (home dose is 36U) tonight.  - Continue to monitor CBG   3. Bipolar disorder: Stable. Patient is followed by behavioral health as an outpatient.  - Continue home medications  - Noted to have low Valproic acid level. We are not adjusting this at this time.  4. FENGI Regular diet SCD's PPI  5. Dispo:  home today.    LOS: 1 day    Michelle Torres 03/03/2011, 10:21 AM

## 2011-03-03 NOTE — Op Note (Signed)
[facility] [address] [place], [place]  [address]  ERCP PROCEDURE REPORT  PATIENT:  Hanaan, Gancarz  MR#:  [id number] BIRTHDATE:  Dec 25, 1960  GENDER:  female  ENDOSCOPIST:  Hedwig Morton. Juanda Chance, MD ASSISTANT:  PROCEDURE DATE:  03/03/2011 PROCEDURE:  ERCP with sphincterotomy, ERCP with balloon passage ASA CLASS:  Class II  INDICATIONS:  abnormal imaging, abnormal Liver Function Tests, abdominal pain hx lap chole 19 years ago,,now RUQ abd. pain,total bili up to 4.0, today down to 2.0, suspect choledocholithiasis, ? passed a stone, she is having less pain today, CBD dilated to 12 mm ( was dilated in 2008)  MEDICATIONS:   These medications were titrated to patient response per physician's verbal order, Versed 6 mg, Fentanyl 75 mcg, Benadryl 50 mg, glucagon 0.5 mg TOPICAL ANESTHETIC:  Cetacaine Spray  DESCRIPTION OF PROCEDURE:   After the risks benefits and alternatives of the procedure were thoroughly explained, informed consent was obtained.  The [id number]-[id number] ([id number]) endoscope was introduced through the mouth and advanced to the second portion of the duodenum.  The pancreatic duct was filled to the tail and appeared to be normal. Care was taken not to overfill the ductal system. Cholecystectomy was noted.  The common bile duct was successfully cannulated and filled with contrast. dilated non obstructed CBD about 75mm,no aparent stones, CBD sweep x 2 there were no stones delivered after the sweep, post occlusion cholangiogram was normal Sphincterotomy was performed with a regular 20 mm pappillotome using guidewire technique (see image001, image002, and image003). The scope was then completely withdrawn from the patient and the procedure terminated. <<PROCEDUREIMAGES>>  COMPLICATIONS:  None  ENDOSCOPIC IMPRESSION: 1) Normal pancreatic duct 2) Cholecystectomy 3) Bile duct cannulation dilated non obstructed CBD 12 mm in diameter, performed sphincterotomy  and sweep of the CBD with a 12 mm BS ballon with delivery of thin yellow bile, no aparent stones RECOMMENDATIONS: advance diet, repeat LFT's in am, home tonight or tomorrow am  ______________________________ Hedwig Morton. Juanda Chance, MD  CC:  n. eSIGNED:   Hedwig Morton. Brodie at 03/03/2011 09:08 AM  Gildner, Myrene Buddy, [phone]

## 2011-03-03 NOTE — Progress Notes (Signed)
Got note that nursing told by Dr. Juanda Chance that pt would stay the night to check if able to tolerate PO intake.  Ate dinner tonight, anticipate discharge early in AM.  No changes need to be made to D/c only date.  PE as stated in D/c summary.

## 2011-03-03 NOTE — Discharge Summary (Addendum)
Physician Discharge Summary  Patient ID: Michelle Torres MRN: [id number] DOB/AGE: May 26, 1960 51 y.o.  Admit date: 03/02/2011 Discharge date: 03/03/2011  Admission Diagnoses: Abdominal pain, abnormal liver enzymes  Discharge Diagnoses:  Principal Problem:  *Elevated LFTs Active Problems:  Fatty infiltration of liver  Nonspecific (abnormal) findings on radiological and other examination of gastrointestinal tract  Bipolar 2 disorder  Diabetes mellitus   Discharged Condition: good  Hospital Course:   Patient presented as a direct admission from [facility] and Urgent care for a chief complaint of abdominal pain. Patient had been evaluated in the ED for chest pain twice; once last week as well as two nights ago. Patient was at work and started feeling light headed. By noon, she was having nausea and decreased PO intake. (Had breakfast which she tolerated well.) Soon afterwards, she started having diarrhea.. At 3:00pm, patient reports having stabbing chest pain and came to ED at [facility]. At that time, she was also having SOB and pain in chest with inspiration. She described that pain as substernal with no radiation. She had full workup in ED and was found to have elevated LFTs as well as increased lipase. She was discharged home after she received Morphine, Zofran and Albuterol breathing treatments and she was feeling better. She states she was able to eat something when she got home, then went to [facility] for further evaluation of elevated LFT's since her pain was not cardiac. Had enderness in abdomen mostly in epigastric area.  Patient has a history of gallstones with Cholecystectomy in 12/1991, but she has not had any other abdominal surgery.  Of note, patient reports increased CBGs in the last 6 weeks. (300-400) Increased insulin at night to help with AM blood sugars. Morning CBG around 140's.  Dr. Cleta Alberts (PCP) was concerned this could be a stone in the common bile duct since she has elevated  LFTs, lipase and dilation seen on CT scan.  Dr. Juanda Chance with [facility] took patient to ERCP the next morning. ERCP was unremarkable. Diagnosis was fatty liver and presumed passed Stone. She was cleared for discharge by GI and the Family Practice service to follow up with Dr. Cleta Alberts. An acute hepatitis panel was negative.   Bipolar Medications were continued and patient was stable.  DM stable in house on 1/2 dose lantus and SSI. Needs outpatient follow up.   Consults: [facility] [place] Dr. Juanda Chance  Significant Diagnostic Studies: ERCP  Treatments: IV hydration, analgesia and procedures: ERCP without biliary stent placement  Discharge Exam: Blood pressure 101/83, pulse 88, temperature 98.2 F (36.8 C), temperature source Oral, resp. rate 20, weight 194 lb 3.6 oz (88.1 kg), SpO2 95.00%. Lungs:  Normal respiratory effort, chest expands symmetrically. Lungs are clear to auscultation, no crackles or wheezes. Heart - Regular rate and rhythm.  No murmurs, gallops or rubs.    Abdomen: soft and non-tender without masses, organomegaly or hernias noted.  No guarding or rebound  Disposition: Home or Self Care   Medication List  As of 03/03/2011 10:32 AM   TAKE these medications         aspirin 325 MG tablet   Take 325 mg by mouth daily.      clonazePAM 1 MG tablet   Commonly known as: KLONOPIN   Take 3 mg by mouth at bedtime.      divalproex 500 MG 24 hr tablet   Commonly known as: DEPAKOTE ER   Take 500 mg by mouth at bedtime.  gabapentin 600 MG tablet   Commonly known as: NEURONTIN   Take 600 mg by mouth at bedtime.      insulin glargine 100 UNIT/ML injection   Commonly known as: LANTUS   Inject 36 Units into the skin at bedtime.      metFORMIN 1000 MG tablet   Commonly known as: GLUCOPHAGE   Take 1,000 mg by mouth 2 (two) times daily with a meal.      naproxen sodium 220 MG tablet   Commonly known as: ANAPROX   Take 440 mg by mouth 2 (two) times daily with a meal. For pain       OSCAL 500/200 D-3 PO   Take 1 tablet by mouth daily.      OXcarbazepine 150 MG tablet   Commonly known as: TRILEPTAL   Take 150 mg by mouth at bedtime.      pregabalin 75 MG capsule   Commonly known as: LYRICA   Take 75 mg by mouth daily.      sertraline 25 MG tablet   Commonly known as: ZOLOFT   Take 25 mg by mouth daily.      topiramate 100 MG tablet   Commonly known as: TOPAMAX   Take 100 mg by mouth at bedtime.         ASK your doctor about these medications         albuterol 108 (90 BASE) MCG/ACT inhaler   Commonly known as: PROVENTIL HFA;VENTOLIN HFA   Inhale 1-2 puffs into the lungs every 6 (six) hours as needed for wheezing.      budesonide-formoterol 160-4.5 MCG/ACT inhaler   Commonly known as: SYMBICORT   Inhale 2 puffs into the lungs 2 (two) times daily.      HYDROcodone-acetaminophen 5-325 MG per tablet   Commonly known as: NORCO   Take 1-2 tablets by mouth every 6 (six) hours as needed for pain.      ondansetron 8 MG disintegrating tablet   Commonly known as: ZOFRAN-ODT   Take 1 tablet (8 mg total) by mouth every 8 (eight) hours as needed for nausea.           Follow-up Information    Follow up with DAUB,STEVE A, MD. Schedule an appointment as soon as possible for a visit in 3 days.   Contact information:   [address] [place] [address] [phone]          Signed: Edd Arbour 03/03/2011, 10:32 AM  Addendum to discharge summary #1 The patient stayed overnight secondary to some abdominal pain. Today patient has no pain has been able to go to the bathroom and had a bowel movement x2. Patient states the gas pain that she had previously is much better. Patient tolerated better well and is ready to go home. Physical exam is unchanged compared to above and no other changes in medication or care. Patient will be going home with followup with Dr. Brett Canales.in the middle of next week.  Addendum to discharge summary #1 Dr. Juanda Chance  preferred to observe patient one more night due to some abdominal pain and desire to follow LFTs one more day. LFTs were stable and will need to be repeated outpatient by Dr. Cleta Alberts.  Patient without abdominal pain on day of discharge even with meals.   Lab 03/05/11 0635 03/04/11 0600 03/03/11 0600 03/01/11 1927  NA -- -- 144 139  K -- -- 3.9 4.0  CL -- -- 111 104  CO2 -- -- 25 23  BUN -- -- 12 14  CREATININE -- -- 0.78 0.85  CALCIUM -- -- 9.0 9.9  PROT 7.5 6.7 6.1 --  BILITOT 0.5 0.4 0.2* --  ALKPHOS 246* 208* 118* --  ALT 624* 490* 125* --  AST 349* 676* 51* --  GLUCOSE -- -- 107* 104*    Physical exam is unchanged compared to above and no other changes in medication or care. Patient will be going home with followup with Dr. Cleta Alberts within 3 days.   Follow up issues: Please follow up abdominal pain levels and repeat LFTs.    Tana Conch, MD, PGY1 03/05/2011 11:03 AM

## 2011-03-03 NOTE — Discharge Summary (Signed)
I interviewed and examined this patient and discussed the care plan with Dr. Rivka Safer and the [facility] team and agree with assessment and plan as documented in the discharge note for today.    Rakel Junio A. Sheffield Slider, MD Family Medicine Teaching Service Attending  03/03/2011 8:03 PM

## 2011-03-03 NOTE — Progress Notes (Signed)
I discussed the care plan with Dr. Rivka Safer and the [facility] team and agree with assessment and plan as documented in the discharge note for today.    Daijah Scrivens A. Sheffield Slider, MD Family Medicine Teaching Service Attending  03/03/2011 12:51 PM

## 2011-03-04 LAB — HEPATITIS PANEL, ACUTE
HCV Ab: NEGATIVE
Hep B C IgM: NEGATIVE
Hepatitis B Surface Ag: NEGATIVE

## 2011-03-04 LAB — HEPATIC FUNCTION PANEL
Bilirubin, Direct: 0.2 mg/dL (ref 0.0–0.3)
Indirect Bilirubin: 0.2 mg/dL — ABNORMAL LOW (ref 0.3–0.9)
Total Bilirubin: 0.4 mg/dL (ref 0.3–1.2)

## 2011-03-04 NOTE — Progress Notes (Signed)
[facility] Daily Rounding Note 03/04/2011, 8:28 AM  SUBJECTIVE: Felt well after ERCP was eating, walking and feeling great.  Then yest evening had incidence of acute moderately severe epigastic pain, relieved with morphine.  It has not returned.  She is eating diabetic solids this AM.  Weight is up about 6# overall.  OBJECTIVE: General: Looks well.  No distress  Vital signs in last 24 hours: Temp:  [98.1 F (36.7 C)-99.6 F (37.6 C)] 98.9 F (37.2 C) (01/13 9562) Pulse Rate:  [58-81] 79  (01/13 0614) Resp:  [12-21] 20  (01/13 0614) BP: (95-151)/(64-99) 116/75 mmHg (01/13 0614) SpO2:  [93 %-99 %] 93 % (01/13 0614) Weight:  [91.082 kg (200 lb 12.8 oz)] 91.082 kg (200 lb 12.8 oz) (01/12 2141) Last BM Date: 03/03/11  Heart: RRR Chest: Clear, no dyspnea Abdomen: Obese, minimal tendernessat epigastrum and right UQ.  Active BS.  ND  Extremities: no edema Neuro/Psych:  Pleasant, not manic but animated.   Intake/Output from previous day: 01/12 0701 - 01/13 0700 In: 1120 [P.O.:120; I.V.:1000] Out: -   Lab Results:  Basename 03/03/11 0600 03/02/11 1329 03/01/11 1927  WBC 10.2 10.4 10.0  HGB 10.9* 12.0 13.3  HCT 33.9* 35.8* 39.4  PLT 273 308 314   BMET  Basename 03/03/11 0600 03/01/11 1927  NA 144 139  K 3.9 4.0  CL 111 104  CO2 25 23  GLUCOSE 107* 104*  BUN 12 14  CREATININE 0.78 0.85  CALCIUM 9.0 9.9   LFT  Basename 03/04/11 0600  PROT 6.7  ALBUMIN 3.3*  AST 676*  ALT 490*  ALKPHOS 208*  BILITOT 0.4  BILIDIR 0.2  IBILI 0.2*   PT/INR  Basename 03/03/11 0600 03/02/11 1329  LABPROT 14.0 13.4  INR 1.06 1.00   Hepatitis Panel  Basename 03/03/11 1028  HEPBSAG NEGATIVE  HCVAB NEGATIVE  HEPAIGM NEGATIVE  HEPBIGM NEGATIVE    Studies/Results: Dg Ercp With Sphincterotomy  03/03/2011  *RADIOLOGY REPORT*  Clinical Data: Dilated common bile duct.  ERCP reported fluoroscopy time is 2 minutes 12 seconds.  Comparison:  CT of the abdomen pelvis 03/01/2011   Technique:  Multiple spot images obtained with the fluoroscopic device and submitted for interpretation post-procedure.  ERCP was performed by Dr.  Juanda Chance.  Findings: Three images are submitted, showing cholecystectomy clips in the right upper quadrant. Laparoscope is in place.  Contrast fills the dilated common bile duct and a portion of the intrahepatic ducts.  The submitted images are limited for evaluation of duct patency. Correlation is recommended with real time findings.  IMPRESSION: Dilated common bile duct.  These images were submitted for radiologic interpretation only. Please see the procedural report for the amount of contrast and the fluoroscopy time utilized.  Original Report Authenticated By: Patterson Hammersmith, M.D.    ASSESMENT: 1.  CBD dilatation chronic, with higher than usual LFTs and acute chest/epigastic pain. , s/p ERCP with sphincterotomy.  No evicence stone/debris in dilated duct, may have passed a stone.  LFTs have risen and are currently at their highest, this may be secondary to the manipulation of ERCP but with the bout of upper abd pain last night, could have mild case of post ERCP pancreatitis.  In any event she feels well again this AM and is eating solids without problems.   2.  Fatty liver, chronically elevated LFTs 3.  IDDM 4.  Obesity 5.  Lap chole 1993 6.  Bipolar disorder, depakote can cause elevation of LFTs.  7.  Anemia, fluids still at 125 cc/ hour since admit, this is causing dilution.  I have dcd the IVF   PLAN: 1.  Lipase level.  DC IVF 2.  Probably can go home, will d/w Dr Juanda Chance.     LOS: 2 days   Jennye Moccasin  03/04/2011, 8:28 AM Pager: [phone]  I have seen and examined the pt this morning and she is having epigastric pain and worsening of her LFT's. I would like her to stay another day and she agrees with it. Will give full liquids and repeat LFT's tomorrow.

## 2011-03-04 NOTE — Discharge Summary (Signed)
I interviewed and examined this patient and discussed the care plan with Dr. Fara Boros and the [facility] team and agree with assessment and plan as documented in the progress note for today. She was walking in the halls hoping to relieve her pain. Dr Juanda Chance wishes to continue observation till tomorrow with the continuing abdominal pain.     Ilene Witcher A. Sheffield Slider, MD Family Medicine Teaching Service Attending  03/04/2011 1:59 PM

## 2011-03-05 ENCOUNTER — Encounter (HOSPITAL_COMMUNITY): Payer: Self-pay | Admitting: Internal Medicine

## 2011-03-05 DIAGNOSIS — F3181 Bipolar II disorder: Secondary | ICD-10-CM | POA: Diagnosis present

## 2011-03-05 DIAGNOSIS — E1165 Type 2 diabetes mellitus with hyperglycemia: Secondary | ICD-10-CM | POA: Diagnosis present

## 2011-03-05 LAB — HEPATIC FUNCTION PANEL
ALT: 624 U/L — ABNORMAL HIGH (ref 0–35)
Alkaline Phosphatase: 246 U/L — ABNORMAL HIGH (ref 39–117)
Bilirubin, Direct: <0.1 mg/dL (ref 0.0–0.3)
Total Bilirubin: 0.5 mg/dL (ref 0.3–1.2)

## 2011-03-05 LAB — GLUCOSE, CAPILLARY

## 2011-03-05 NOTE — Discharge Summary (Signed)
I discussed with Dr Durene Cal.  I agree with their plans documented in their note for today.

## 2011-03-05 NOTE — Progress Notes (Signed)
     [facility] Daily Rounding Note 03/05/2011, 8:53 AM  SUBJECTIVE: Feeks wekk.  No recurrent abd pain.  Stools brown, formed, "normal".    OBJECTIVE: General: Looks well.   Vital signs in last 24 hours: Temp:  [97.6 F (36.4 C)-98.9 F (37.2 C)] 98.2 F (36.8 C) (01/14 0525) Pulse Rate:  [84-93] 88  (01/14 0525) Resp:  [18-20] 20  (01/14 0525) BP: (101-137)/(59-89) 101/83 mmHg (01/14 0525) SpO2:  [93 %-96 %] 95 % (01/14 0525) Weight:  [88.1 kg (194 lb 3.6 oz)] 88.1 kg (194 lb 3.6 oz) (01/13 2100) Last BM Date: 03/04/11  Heart: RRR Chest: Clear B Abdomen: soft, obese, NT, ND.  Active BS  Extremities: no edema. Neuro/Psych:  Pleasant.  Slightly anxious.  Appropriate.  Intake/Output from previous day:  Not on strict meaurements of I/Os. 01/13 0701 - 01/14 0700 In: 720 [P.O.:720] Out: 350 [Urine:350]  Lab Results:  Basename 03/03/11 0600 03/02/11 1329  WBC 10.2 10.4  HGB 10.9* 12.0  HCT 33.9* 35.8*  PLT 273 308   BMET  Basename 03/03/11 0600  NA 144  K 3.9  CL 111  CO2 25  GLUCOSE 107*  BUN 12  CREATININE 0.78  CALCIUM 9.0   LFT  Basename 03/05/11 0635  PROT 7.5  ALBUMIN 3.4*  AST 349*  ALT 624*  ALKPHOS 246*  BILITOT 0.5  BILIDIR <0.1  IBILI NOT CALCULATED   PT/INR  Basename 03/03/11 0600 03/02/11 1329  LABPROT 14.0 13.4  INR 1.06 1.00   Hepatitis Panel  Basename 03/03/11 1028  HEPBSAG NEGATIVE  HCVAB NEGATIVE  HEPAIGM NEGATIVE  HEPBIGM NEGATIVE   ASSESMENT: 1. CBD dilatation chronic, with higher than usual LFTs and acute chest/epigastic pain. , s/p ERCP with sphincterotomy. No evicence stone/debris in dilated duct, may have passed a stone.  LFTs still elevated but clinically pt doing well and ok for discharge 2. Post ERCP pancreatitis, mild.     3. Fatty liver, chronically elevated LFTs  4. IDDM  5. Obesity  6. Lap chole 1993  7. Bipolar disorder, depakote can cause elevation of LFTs.  8. Anemia.  Dilutional effect of IVF  contributed.  IVF dc'd yesterday.   PLAN: 1.  DC pt home today.  GI follow up prn (Dr Christella Hartigan).  Primary care (Dr Cleta Alberts) can recheck LFTs in a few to several weeks. 2.  Diabetic diet.   LOS: 3 days   Jennye Moccasin  03/05/2011, 8:53 AM Pager: [phone]

## 2011-03-05 NOTE — Progress Notes (Signed)
03/05/2011  11:24 AM  Reviewed AVS with patient,including when her next dose of medications were due, when to follow up with the MD, and her d/c instructions from the doctor.  Pt verbalized understanding, and did not have any questions regarding the discharge information.  IV line is out, pt belongings sent with pt from the bedside.  Pt discharged home with her husband.   Eunice Blase

## 2011-03-07 ENCOUNTER — Ambulatory Visit (INDEPENDENT_AMBULATORY_CARE_PROVIDER_SITE_OTHER): Payer: BC Managed Care – PPO

## 2011-03-07 DIAGNOSIS — R1084 Generalized abdominal pain: Secondary | ICD-10-CM

## 2011-03-07 DIAGNOSIS — E119 Type 2 diabetes mellitus without complications: Secondary | ICD-10-CM

## 2011-03-07 DIAGNOSIS — R7402 Elevation of levels of lactic acid dehydrogenase (LDH): Secondary | ICD-10-CM

## 2011-03-14 ENCOUNTER — Other Ambulatory Visit (INDEPENDENT_AMBULATORY_CARE_PROVIDER_SITE_OTHER): Payer: BC Managed Care – PPO

## 2011-03-14 DIAGNOSIS — E119 Type 2 diabetes mellitus without complications: Secondary | ICD-10-CM

## 2011-04-23 ENCOUNTER — Ambulatory Visit (INDEPENDENT_AMBULATORY_CARE_PROVIDER_SITE_OTHER): Payer: BC Managed Care – PPO | Admitting: Internal Medicine

## 2011-04-23 VITALS — BP 105/69 | HR 63 | Temp 98.8°F | Resp 16 | Ht 60.4 in | Wt 194.0 lb

## 2011-04-23 DIAGNOSIS — J019 Acute sinusitis, unspecified: Secondary | ICD-10-CM

## 2011-04-23 DIAGNOSIS — R05 Cough: Secondary | ICD-10-CM

## 2011-04-23 DIAGNOSIS — J329 Chronic sinusitis, unspecified: Secondary | ICD-10-CM

## 2011-04-23 DIAGNOSIS — Z79899 Other long term (current) drug therapy: Secondary | ICD-10-CM

## 2011-04-23 MED ORDER — HYDROCODONE-ACETAMINOPHEN 7.5-500 MG/15ML PO SOLN: 5.0000 mL | Freq: Four times a day (QID) | ORAL | Status: AC | PRN

## 2011-04-23 MED ORDER — METFORMIN HCL 1000 MG PO TABS: 1000.0000 mg | ORAL_TABLET | Freq: Two times a day (BID) | ORAL | Status: DC

## 2011-04-23 MED ORDER — AMOXICILLIN 500 MG PO CAPS: 1000.0000 mg | ORAL_CAPSULE | Freq: Two times a day (BID) | ORAL | Status: AC

## 2011-04-23 NOTE — Patient Instructions (Signed)

## 2011-04-23 NOTE — Progress Notes (Signed)
  Subjective:    Patient ID: Michelle Torres, female    DOB: 13-Apr-1960, 51 y.o.   MRN: [id number]  HPI C/o progressive facial pain with  Green nasal discharge. Moderate cough, no sob or cp. Requests refill of metformin   Review of Systems     Objective:   Physical Exam  NAD except sinuses      Assessment & Plan:   Amoxil 1g bid Lortab elixir 6oz 1tsp q 6hr Refill metformin

## 2011-05-01 ENCOUNTER — Telehealth: Payer: Self-pay

## 2011-05-01 NOTE — Telephone Encounter (Signed)
.  [facility] PT STATES SHE WAS GIVEN AMOX AND NOW HAVE A YEAST INFECTION. WOULD LIKE TO HAVE SOMETHING CALLED IN PLEASE CALL [phone] OR HER HOME AT [phone]    [facility] ON [address]

## 2011-05-02 MED ORDER — FLUCONAZOLE 150 MG PO TABS: 150.0000 mg | ORAL_TABLET | Freq: Once | ORAL | Status: AC

## 2011-05-02 NOTE — Telephone Encounter (Signed)
Sent in Rx for Diflucan to the pharmacy.  Michelle Torres

## 2011-05-02 NOTE — Telephone Encounter (Signed)
Can we rx Diflucan for pt?

## 2011-05-03 NOTE — Telephone Encounter (Signed)
PT HAS PICKED UP MEDS ALREADY

## 2011-05-30 ENCOUNTER — Ambulatory Visit (INDEPENDENT_AMBULATORY_CARE_PROVIDER_SITE_OTHER): Payer: BC Managed Care – PPO | Admitting: Physician Assistant

## 2011-05-30 ENCOUNTER — Encounter: Payer: Self-pay | Admitting: Physician Assistant

## 2011-05-30 VITALS — BP 110/68 | HR 72 | Temp 98.7°F | Resp 16 | Ht 60.0 in | Wt 197.0 lb

## 2011-05-30 DIAGNOSIS — R350 Frequency of micturition: Secondary | ICD-10-CM

## 2011-05-30 DIAGNOSIS — E119 Type 2 diabetes mellitus without complications: Secondary | ICD-10-CM

## 2011-05-30 DIAGNOSIS — R143 Flatulence: Secondary | ICD-10-CM

## 2011-05-30 DIAGNOSIS — N898 Other specified noninflammatory disorders of vagina: Secondary | ICD-10-CM

## 2011-05-30 DIAGNOSIS — G43909 Migraine, unspecified, not intractable, without status migrainosus: Secondary | ICD-10-CM

## 2011-05-30 DIAGNOSIS — Z79899 Other long term (current) drug therapy: Secondary | ICD-10-CM

## 2011-05-30 DIAGNOSIS — R140 Abdominal distension (gaseous): Secondary | ICD-10-CM

## 2011-05-30 DIAGNOSIS — F319 Bipolar disorder, unspecified: Secondary | ICD-10-CM

## 2011-05-30 DIAGNOSIS — J310 Chronic rhinitis: Secondary | ICD-10-CM

## 2011-05-30 LAB — POCT WET PREP WITH KOH
Clue Cells Wet Prep HPF POC: NEGATIVE
Trichomonas, UA: NEGATIVE

## 2011-05-30 LAB — POCT UA - MICROSCOPIC ONLY
RBC, urine, microscopic: NEGATIVE
WBC, Ur, HPF, POC: NEGATIVE
Yeast, UA: NEGATIVE

## 2011-05-30 LAB — POCT CBC
Hemoglobin: 13 g/dL (ref 12.2–16.2)
Lymph, poc: 4.6 — AB (ref 0.6–3.4)
MCH, POC: 29.1 pg (ref 27–31.2)
MCHC: 33.8 g/dL (ref 31.8–35.4)
MCV: 86.4 fL (ref 80–97)
MID (cbc): 0.9 (ref 0–0.9)
MPV: 8.7 fL (ref 0–99.8)
POC MID %: 8.7 %M (ref 0–12)
Platelet Count, POC: 330 10*3/uL (ref 142–424)
WBC: 10 10*3/uL (ref 4.6–10.2)

## 2011-05-30 LAB — COMPREHENSIVE METABOLIC PANEL
Albumin: 4.2 g/dL (ref 3.5–5.2)
Alkaline Phosphatase: 95 U/L (ref 39–117)
BUN: 14 mg/dL (ref 6–23)
CO2: 23 mEq/L (ref 19–32)
Glucose, Bld: 235 mg/dL — ABNORMAL HIGH (ref 70–99)
Potassium: 4.2 mEq/L (ref 3.5–5.3)
Sodium: 136 mEq/L (ref 135–145)
Total Protein: 7.1 g/dL (ref 6.0–8.3)

## 2011-05-30 LAB — POCT URINALYSIS DIPSTICK
Bilirubin, UA: NEGATIVE
Blood, UA: NEGATIVE
Ketones, UA: NEGATIVE
Nitrite, UA: NEGATIVE
Spec Grav, UA: <=1.005
Urobilinogen, UA: 0.2
pH, UA: 6.5

## 2011-05-30 MED ORDER — IPRATROPIUM BROMIDE 0.03 % NA SOLN: 2.0000 | Freq: Two times a day (BID) | NASAL | Status: DC

## 2011-05-30 MED ORDER — GLUCOSE BLOOD VI STRP: ORAL_STRIP | Status: DC

## 2011-05-30 MED ORDER — INSULIN GLARGINE 100 UNIT/ML ~~LOC~~ SOLN: 36.0000 [IU] | Freq: Every day | SUBCUTANEOUS | Status: DC

## 2011-05-30 MED ORDER — FLUCONAZOLE 150 MG PO TABS: 150.0000 mg | ORAL_TABLET | Freq: Once | ORAL | Status: AC

## 2011-05-30 MED ORDER — DICYCLOMINE HCL 10 MG PO CAPS: 10.0000 mg | ORAL_CAPSULE | Freq: Three times a day (TID) | ORAL | Status: DC

## 2011-05-30 MED ORDER — FLUTICASONE PROPIONATE 50 MCG/ACT NA SUSP: 2.0000 | Freq: Every day | NASAL | Status: DC

## 2011-05-30 NOTE — Patient Instructions (Signed)
You know what to do, it's just the "do it!"

## 2011-05-30 NOTE — Progress Notes (Signed)
Subjective:    Patient ID: Michelle Torres, female    DOB: 1960-09-06, 51 y.o.   MRN: [id number]  HPI Presents with multiple issues:  1. Saw Dr. Perrin Maltese 04/23/11. Had sinusitis.  Received Amoxicillin.  1 week later, developed a yeast infection.  Called [place] and we called in Diflucan, with complete symptom resolution.  Yeast infection (itching and very mild vaginal discharge) recurred 4 days ago, dramatic worsening x 2 days.  No change in soap, detergent, etc. No sex.  The itching is all over, and "insane." Some increased urinary frequency. Dysuria x 2 days. No hematuria.  2. "I'm trying to get another sinus infection." Mild sinus pressure and throat irritation yesterday. Awoke this am at 4 with nasal congestion, facial pressure and pain (frontal), mild post nasal drainage, scratchy throat, laryngitis.  3. Unexplained bloating.  Bowels moving regularly (coffee resolved previous constipation). No pain, but abdomen is distended.  Occurs up to 2 times weekly. Resolves spontaneously.  May last all day.  No relation to eating or BMs.  Occasionally has increased flatulence, but no increased belching.   4. HA frequency has increased.  Not "the full-blown migraine" but Excedrin migraine is no longer adequate.  Nausea present. Can occur 2 x weekly, but thinks the increase may be related to recent sinus symptoms.  Needs note for work for today, refills of one-touch ultra strips, Lantus and Flonase.  Has gained some weight back. Will resume going to the gym when she's well from the sinus symptoms.  Her blood sugar has been higher than desired, 200 this morning.  Will see Dr. Phillip Heal next month-needs liver profile for medication monitoring.   Review of Systems As above.  No chest pain, SOB, dizziness, vision change.    Objective:   Physical Exam  Vital signs noted. Well-developed, well nourished WF who is awake, alert and oriented, in NAD. HEENT: [place]/AT, PERRL, EOMI.  Sclera and conjunctiva are  clear.  Funduscopic exam is normal. EAC are patent, TMs are normal in appearance. Nasal mucosa is pink and moist. OP is clear. Neck: supple, non-tender, no lymphadenopathey, thyromegaly. Heart: RRR, no murmur Lungs: CTA Abdomen: normo-active bowel sounds, supple, mild tenderness in the left lower quadrant but otherwise non-tender, no mass or organomegaly. Extremities: no cyanosis, clubbing or edema. Skin: warm and dry without rash. GU: normal female external genitalia, no lesions.  Vaginal mucosa is pink and moist. Cervix is normal appearing.  Scant fluid in the vault.  Results for orders placed in visit on 05/30/11  POCT URINALYSIS DIPSTICK      Component Value Range   Color, UA pale yellow     Clarity, UA clear     Glucose, UA 500     Bilirubin, UA neg     Ketones, UA neg     Spec Grav, UA <=1.005     Blood, UA neg     pH, UA 6.5     Protein, UA neg     Urobilinogen, UA 0.2     Nitrite, UA neg     Leukocytes, UA Negative    POCT UA - MICROSCOPIC ONLY      Component Value Range   WBC, Ur, HPF, POC neg     RBC, urine, microscopic neg     Bacteria, U Microscopic neg     Mucus, UA neg     Epithelial cells, urine per micros neg     Crystals, Ur, HPF, POC neg     Casts, Ur, LPF,  POC neg     Yeast, UA neg    POCT CBC      Component Value Range   WBC 10.0  4.6 - 10.2 (K/uL)   Lymph, poc 4.6 (*) 0.6 - 3.4    POC LYMPH PERCENT 46.5  10 - 50 (%L)   MID (cbc) 0.9  0 - 0.9    POC MID % 8.7  0 - 12 (%M)   POC Granulocyte 4.5  2 - 6.9    Granulocyte percent 44.8  37 - 80 (%G)   RBC 4.46  4.04 - 5.48 (M/uL)   Hemoglobin 13.0  12.2 - 16.2 (g/dL)   HCT, POC 78.2  95.6 - 47.9 (%)   MCV 86.4  80 - 97 (fL)   MCH, POC 29.1  27 - 31.2 (pg)   MCHC 33.8  31.8 - 35.4 (g/dL)   RDW, POC 21.3     Platelet Count, POC 330  142 - 424 (K/uL)   MPV 8.7  0 - 99.8 (fL)  GLUCOSE, POCT (MANUAL RESULT ENTRY)      Component Value Range   POC Glucose 225    POCT GLYCOSYLATED HEMOGLOBIN (HGB A1C)       Component Value Range   Hemoglobin A1C 9.7    POCT WET PREP WITH KOH      Component Value Range   Trichomonas, UA neg     Clue Cells Wet Prep HPF POC neg     Epithelial Wet Prep HPF POC 0-1     Yeast Wet Prep HPF POC neg     Bacteria Wet Prep HPF POC 3+/rods     RBC Wet Prep HPF POC 0-1     WBC Wet Prep HPF POC 0-1     KOH Prep POC Negative        Assessment & Plan:   1. Frequency of urination  POCT Urinalysis Dipstick, POCT UA - Microscopic Only, POCT glucose (manual entry)  2. Leukorrhea, not specified as infective  POCT Wet Prep with KOH, fluconazole (DIFLUCAN) 150 MG tablet  3. Type II or unspecified type diabetes mellitus without mention of complication, not stated as uncontrolled  POCT glycosylated hemoglobin (Hb A1C), Comprehensive metabolic panel, insulin glargine (LANTUS SOLOSTAR) 100 UNIT/ML injection, glucose blood test strip  4. Encounter for long-term (current) use of other medications  POCT CBC, Comprehensive metabolic panel  5. Migraine  ipratropium (ATROVENT) 0.03 % nasal spray  6. Bipolar disorder    7. Abdominal bloating  dicyclomine (BENTYL) 10 MG capsule  8. Rhinitis  ipratropium (ATROVENT) 0.03 % nasal spray, fluticasone (FLONASE) 50 MCG/ACT nasal spray   Re-evaluate in 3 months, sooner if needed.

## 2011-05-31 ENCOUNTER — Encounter: Payer: Self-pay | Admitting: Physician Assistant

## 2011-06-06 ENCOUNTER — Ambulatory Visit (INDEPENDENT_AMBULATORY_CARE_PROVIDER_SITE_OTHER): Payer: BC Managed Care – PPO | Admitting: Physician Assistant

## 2011-06-06 VITALS — BP 115/78 | HR 75 | Temp 98.1°F | Resp 16 | Ht 59.75 in | Wt 196.8 lb

## 2011-06-06 DIAGNOSIS — L03039 Cellulitis of unspecified toe: Secondary | ICD-10-CM

## 2011-06-06 DIAGNOSIS — M79609 Pain in unspecified limb: Secondary | ICD-10-CM

## 2011-06-06 DIAGNOSIS — S90129A Contusion of unspecified lesser toe(s) without damage to nail, initial encounter: Secondary | ICD-10-CM

## 2011-06-06 DIAGNOSIS — M79676 Pain in unspecified toe(s): Secondary | ICD-10-CM

## 2011-06-06 MED ORDER — SULFAMETHOXAZOLE-TRIMETHOPRIM 800-160 MG PO TABS: 1.0000 | ORAL_TABLET | Freq: Two times a day (BID) | ORAL | Status: AC

## 2011-06-06 NOTE — Progress Notes (Signed)
  Subjective:    Patient ID: Michelle Torres, female    DOB: [birth year], 51 y.o.   MRN: [id number]  HPI Patient is a 51 y/o wf with hx diabetes who presents today with a cc of pain in the left great toe. 3 days ago patient hit her to against the bathroom door.  She now has an area of,redness, pain and swelling along the medial nail bed. Pt states that she can move her toe fully. She denies constitutional sxs.    Review of Systems ROS as stated in HPI    Objective:   Physical Exam  Constitutional: She appears well-developed and well-nourished.  HENT:  Head: Normocephalic and atraumatic.  Musculoskeletal:       Feet:       Area of dark discoloration,erythema, and induration along medial nail.  Pinpoint are of fluctuance. No palpable heat, no discharge. No bony abnormalities PTP of joints. Full ROM of L great toe.          Assessment & Plan:   1. Contusion, toe    2. Pain in toe    3. Paronychia, toe  sulfamethoxazole-trimethoprim (BACTRIM DS,SEPTRA DS) 800-160 MG per tablet   Supportive care.

## 2011-06-06 NOTE — Patient Instructions (Signed)
Soak your toe in warm water for 15-20 minutes 2-3 times daily.  If the symptoms improve, complete the antibiotic.  If the symptoms worsen or persist, return for re-evaluation.

## 2011-06-06 NOTE — Progress Notes (Signed)
Examined with student and agree.

## 2011-06-29 ENCOUNTER — Telehealth: Payer: Self-pay

## 2011-06-29 NOTE — Telephone Encounter (Signed)
Pt was diagnosed with hallux rigidus by [facility] Pt would like to have you give her your opinion as to whether or not she should proceed with the surgery that they are wanting her to have.

## 2011-06-29 NOTE — Telephone Encounter (Signed)
Chelle,  Please review.

## 2011-06-29 NOTE — Telephone Encounter (Signed)
Let's send her the info about Hallux Rigidus that describes treatment alternatives.  If the more conservative treatments have not adequately alleviated her discomfort, then I would support her having the surgery.  Only she can determine if the discomfort is worth it.

## 2011-06-29 NOTE — Telephone Encounter (Signed)
LMOM to CB.  Info mailed to patient.

## 2011-07-02 NOTE — Telephone Encounter (Signed)
Spoke w/husband (OK per HIPPA) bc pt was at work. Told him we have mailed info about Tx. Pt will review info when received and will CB w/ ?s or if she would like a referral for another podiatrist for 2nd opinion.

## 2011-10-10 ENCOUNTER — Ambulatory Visit (INDEPENDENT_AMBULATORY_CARE_PROVIDER_SITE_OTHER): Payer: BC Managed Care – PPO | Admitting: Physician Assistant

## 2011-10-10 VITALS — BP 120/74 | HR 77 | Temp 98.2°F | Resp 20 | Ht 60.0 in | Wt 195.0 lb

## 2011-10-10 DIAGNOSIS — R110 Nausea: Secondary | ICD-10-CM

## 2011-10-10 DIAGNOSIS — G43909 Migraine, unspecified, not intractable, without status migrainosus: Secondary | ICD-10-CM

## 2011-10-10 DIAGNOSIS — R109 Unspecified abdominal pain: Secondary | ICD-10-CM

## 2011-10-10 DIAGNOSIS — E119 Type 2 diabetes mellitus without complications: Secondary | ICD-10-CM

## 2011-10-10 LAB — COMPREHENSIVE METABOLIC PANEL
ALT: 26 U/L (ref 0–35)
AST: 22 U/L (ref 0–37)
Albumin: 4 g/dL (ref 3.5–5.2)
Alkaline Phosphatase: 82 U/L (ref 39–117)
BUN: 9 mg/dL (ref 6–23)
CO2: 26 mEq/L (ref 19–32)
Calcium: 9.5 mg/dL (ref 8.4–10.5)
Chloride: 108 mEq/L (ref 96–112)
Creat: 0.75 mg/dL (ref 0.50–1.10)
Glucose, Bld: 128 mg/dL — ABNORMAL HIGH (ref 70–99)
Potassium: 4.5 mEq/L (ref 3.5–5.3)
Sodium: 142 mEq/L (ref 135–145)
Total Bilirubin: 0.2 mg/dL — ABNORMAL LOW (ref 0.3–1.2)
Total Protein: 6.9 g/dL (ref 6.0–8.3)

## 2011-10-10 LAB — POCT CBC
Granulocyte percent: 47.2 %G (ref 37–80)
HCT, POC: 40.9 % (ref 37.7–47.9)
Hemoglobin: 12.6 g/dL (ref 12.2–16.2)
Lymph, poc: 3.8 — AB (ref 0.6–3.4)
MCH, POC: 28.4 pg (ref 27–31.2)
MCHC: 30.8 g/dL — AB (ref 31.8–35.4)
MCV: 92.1 fL (ref 80–97)
MID (cbc): 0.7 (ref 0–0.9)
MPV: 8.8 fL (ref 0–99.8)
POC Granulocyte: 4.1 (ref 2–6.9)
POC LYMPH PERCENT: 44.5 %L (ref 10–50)
POC MID %: 8.3 %M (ref 0–12)
Platelet Count, POC: 357 10*3/uL (ref 142–424)
RBC: 4.44 M/uL (ref 4.04–5.48)
RDW, POC: 14.6 %
WBC: 8.6 10*3/uL (ref 4.6–10.2)

## 2011-10-10 LAB — GLUCOSE, POCT (MANUAL RESULT ENTRY): POC Glucose: 129 mg/dl — AB (ref 70–99)

## 2011-10-10 LAB — POCT GLYCOSYLATED HEMOGLOBIN (HGB A1C): Hemoglobin A1C: 6.9

## 2011-10-10 MED ORDER — ELETRIPTAN HYDROBROMIDE 40 MG PO TABS: 40.0000 mg | ORAL_TABLET | ORAL | Status: DC | PRN

## 2011-10-10 MED ORDER — KETOROLAC TROMETHAMINE 60 MG/2ML IM SOLN
60.0000 mg | Freq: Once | INTRAMUSCULAR | Status: AC
  Administered 2011-10-10: 60 mg via INTRAMUSCULAR

## 2011-10-10 MED ORDER — PROMETHAZINE HCL 25 MG/ML IJ SOLN
25.0000 mg | Freq: Once | INTRAMUSCULAR | Status: AC
  Administered 2011-10-10: 25 mg via INTRAMUSCULAR

## 2011-10-10 MED ORDER — DICYCLOMINE HCL 10 MG PO CAPS: 10.0000 mg | ORAL_CAPSULE | Freq: Three times a day (TID) | ORAL | Status: DC

## 2011-10-10 MED ORDER — PROMETHAZINE HCL 25 MG/ML IJ SOLN: 25.0000 mg | Freq: Four times a day (QID) | INTRAMUSCULAR | Status: DC | PRN

## 2011-10-10 NOTE — Progress Notes (Unsigned)
Completed prior auth for pt's Relpax over the phone and received approval through 10/09/12. Faxed approval notice to pharmacy.

## 2011-10-10 NOTE — Progress Notes (Signed)
Subjective:    Patient ID: Michelle Torres, female    DOB: 1960/03/17, 51 y.o.   MRN: [id number]  HPI 51 year old female presents today for evaluation of a migraine. Does have a history of migraine's and this feels like a typical headache for her.  She has had photophobia, nausea without vomiting, and overall feeling of malaise.  Has not had Relpax to take and unable to get filled until Friday.  Denies vision changes, SOB, chest pain, or dizziness.  Also complains of abdominal discomfort and bloating x several weeks.  She has had some loose stools but mostly complains of abdominal bloating after eating.  Has intermittent normal bowel movements but has had some hard stools.  Has been taking Gas-X which is not helping.  Denies urinary frequency, dysuria, fever, or chills.      Review of Systems  All other systems reviewed and are negative.       Objective:   Physical Exam  Constitutional: She is oriented to person, place, and time. She appears well-developed and well-nourished.  HENT:  Head: Normocephalic and atraumatic.  Right Ear: Hearing, tympanic membrane, external ear and ear canal normal.  Left Ear: Hearing, tympanic membrane, external ear and ear canal normal.  Mouth/Throat: Uvula is midline, oropharynx is clear and moist and mucous membranes are normal. No oropharyngeal exudate.  Eyes: Conjunctivae and EOM are normal. Pupils are equal, round, and reactive to light.  Neck: Normal range of motion.  Cardiovascular: Normal rate, regular rhythm and normal heart sounds.   Pulmonary/Chest: Effort normal and breath sounds normal.  Abdominal: Soft. Bowel sounds are normal. There is Tenderness: diffuse tenderness, no rebound or guarding.. There is no rebound and no guarding.  Lymphadenopathy:    She has no cervical adenopathy.  Neurological: She is alert and oriented to person, place, and time.  Psychiatric: She has a normal mood and affect. Her behavior is normal. Judgment and thought  content normal.      Results for orders placed in visit on 10/10/11  POCT CBC      Component Value Range   WBC 8.6  4.6 - 10.2 K/uL   Lymph, poc 3.8 (*) 0.6 - 3.4   POC LYMPH PERCENT 44.5  10 - 50 %L   MID (cbc) 0.7  0 - 0.9   POC MID % 8.3  0 - 12 %M   POC Granulocyte 4.1  2 - 6.9   Granulocyte percent 47.2  37 - 80 %G   RBC 4.44  4.04 - 5.48 M/uL   Hemoglobin 12.6  12.2 - 16.2 g/dL   HCT, POC 40.9  81.1 - 47.9 %   MCV 92.1  80 - 97 fL   MCH, POC 28.4  27 - 31.2 pg   MCHC 30.8 (*) 31.8 - 35.4 g/dL   RDW, POC 91.4     Platelet Count, POC 357  142 - 424 K/uL   MPV 8.8  0 - 99.8 fL  POCT GLYCOSYLATED HEMOGLOBIN (HGB A1C)      Component Value Range   Hemoglobin A1C 6.9    GLUCOSE, POCT (MANUAL RESULT ENTRY)      Component Value Range   POC Glucose 129 (*) 70 - 99 mg/dl       Assessment & Plan:   1. Diabetes mellitus type II  HgA1c 6.9 today (9.7 in April!)  Continue current treatment plan. Follow up in 3 months POCT CBC, POCT glycosylated hemoglobin (Hb A1C), Comprehensive metabolic panel, POCT glucose (  manual entry)  2. Abdominal  pain, other specified site  Recommend Miralax daily to regulate BM If BM normalize and still have cramping and bloating, try Bentyl.  If symptoms continue to persist, recommend follow up or possibly referral to GI dicyclomine (BENTYL) 10 MG capsule  3. Nausea alone  promethazine (PHENERGAN) injection 25 mg  4. Migraine  eletriptan (RELPAX) 40 MG tablet, ketorolac (TORADOL) injection 60 mg

## 2011-12-17 ENCOUNTER — Ambulatory Visit (INDEPENDENT_AMBULATORY_CARE_PROVIDER_SITE_OTHER): Payer: BC Managed Care – PPO | Admitting: Emergency Medicine

## 2011-12-17 VITALS — BP 110/68 | HR 73 | Temp 98.1°F | Resp 18 | Ht 63.0 in | Wt 193.6 lb

## 2011-12-17 DIAGNOSIS — Z23 Encounter for immunization: Secondary | ICD-10-CM

## 2011-12-17 DIAGNOSIS — F319 Bipolar disorder, unspecified: Secondary | ICD-10-CM

## 2011-12-17 DIAGNOSIS — E119 Type 2 diabetes mellitus without complications: Secondary | ICD-10-CM

## 2011-12-17 DIAGNOSIS — J029 Acute pharyngitis, unspecified: Secondary | ICD-10-CM

## 2011-12-17 DIAGNOSIS — Z79899 Other long term (current) drug therapy: Secondary | ICD-10-CM

## 2011-12-17 LAB — COMPREHENSIVE METABOLIC PANEL
BUN: 19 mg/dL (ref 6–23)
CO2: 21 mEq/L (ref 19–32)
Calcium: 9.4 mg/dL (ref 8.4–10.5)
Chloride: 107 mEq/L (ref 96–112)
Creat: 0.68 mg/dL (ref 0.50–1.10)
Total Bilirubin: 0.3 mg/dL (ref 0.3–1.2)

## 2011-12-17 LAB — POCT CBC
HCT, POC: 49.1 % — AB (ref 37.7–47.9)
Hemoglobin: 15 g/dL (ref 12.2–16.2)
Lymph, poc: 3 (ref 0.6–3.4)
MCH, POC: 28.1 pg (ref 27–31.2)
MCHC: 30.5 g/dL — AB (ref 31.8–35.4)
MPV: 8.4 fL (ref 0–99.8)
POC LYMPH PERCENT: 45.4 %L (ref 10–50)
POC MID %: 6.4 %M (ref 0–12)
RDW, POC: 14.6 %
WBC: 6.7 10*3/uL (ref 4.6–10.2)

## 2011-12-17 LAB — VALPROIC ACID LEVEL: Valproic Acid Lvl: 41.9 ug/mL — ABNORMAL LOW (ref 50.0–100.0)

## 2011-12-17 MED ORDER — INSULIN GLARGINE 100 UNIT/ML ~~LOC~~ SOLN: SUBCUTANEOUS | Status: DC

## 2011-12-17 NOTE — Progress Notes (Signed)
  Subjective:    Patient ID: Michelle Torres, female    DOB: 1961-02-15, 51 y.o.   MRN: [id number]  HPI  Pt states that her sugar levels are as high as 220 using 32 - 36 units of insulin at night.  Last night pt used 40 units of insulin to try to get her sugars down.   Pt also presents with a sore throat and chirping in her ears.  Pt currently uses flonase to help her sinuses.      Review of Systems     Objective:   Physical Exam HEENT exam reveals mild redness of the posterior pharynx. The neck is supple. The chest is clear to both auscultation and percussion. Cardiac exam reveals a regular rate without murmurs rubs or gallops  Results for orders placed in visit on 12/17/11  POCT RAPID STREP A (OFFICE)      Component Value Range   Rapid Strep A Screen Negative  Negative  POCT CBC      Component Value Range   WBC 6.7  4.6 - 10.2 K/uL   Lymph, poc 3.0  0.6 - 3.4   POC LYMPH PERCENT 45.4  10 - 50 %L   MID (cbc) 0.4  0 - 0.9   POC MID % 6.4  0 - 12 %M   POC Granulocyte 3.2  2 - 6.9   Granulocyte percent 48.2  37 - 80 %G   RBC 5.33  4.04 - 5.48 M/uL   Hemoglobin 15.0  12.2 - 16.2 g/dL   HCT, POC 40.9 (*) 81.1 - 47.9 %   MCV 92.2  80 - 97 fL   MCH, POC 28.1  27 - 31.2 pg   MCHC 30.5 (*) 31.8 - 35.4 g/dL   RDW, POC 91.4     Platelet Count, POC 254  142 - 424 K/uL   MPV 8.4  0 - 99.8 fL  GLUCOSE, POCT (MANUAL RESULT ENTRY)      Component Value Range   POC Glucose 179 (*) 70 - 99 mg/dl        Assessment & Plan:  We will proceed to increase her insulin to 40 units a day to gradually increase by 2 units every 2-3 days until we achieve a morning sugar 120-140.

## 2011-12-17 NOTE — Patient Instructions (Addendum)
Please increase your Lantus to 40 units a day. Increase her Lantus by 2 units every 3 days to achieve a morning sugar of 120-140 the maximum of 50 units a day

## 2011-12-22 ENCOUNTER — Ambulatory Visit: Payer: BC Managed Care – PPO

## 2011-12-22 ENCOUNTER — Ambulatory Visit (INDEPENDENT_AMBULATORY_CARE_PROVIDER_SITE_OTHER): Payer: BC Managed Care – PPO | Admitting: Internal Medicine

## 2011-12-22 VITALS — BP 118/79 | HR 76 | Temp 98.1°F | Resp 16 | Ht 59.0 in | Wt 193.0 lb

## 2011-12-22 DIAGNOSIS — M79669 Pain in unspecified lower leg: Secondary | ICD-10-CM

## 2011-12-22 DIAGNOSIS — M79609 Pain in unspecified limb: Secondary | ICD-10-CM

## 2011-12-22 MED ORDER — CYCLOBENZAPRINE HCL 10 MG PO TABS: 10.0000 mg | ORAL_TABLET | Freq: Three times a day (TID) | ORAL | Status: DC | PRN

## 2011-12-22 MED ORDER — MELOXICAM 15 MG PO TABS: 15.0000 mg | ORAL_TABLET | Freq: Every day | ORAL | Status: DC

## 2011-12-22 NOTE — Patient Instructions (Addendum)
Rest your leg as much as possible.  Use the cane or crutches so you can reduce the weight bearing on the leg that hurts. The cyclobenzaprine (Flexeril) causes sleepiness-you may need to reserve it just for bedtime.

## 2011-12-22 NOTE — Progress Notes (Signed)
Subjective:    Patient ID: Michelle Torres, female    DOB: 1960-08-15, 51 y.o.   MRN: [id number]  HPI This 51 y.o. female presents for evaluation of LEFT leg pain beginning 12/20/11.  No injury recalled.  Symptoms began when she got up after watching [employer] Show at the [employer] with her family.  It was mild initially, but was much worse when she got up the next morning.  Weight bearing exacerbates pain.  She has applied heat and ice and used naproxen and topical icy-hot product without benefit.  She is travelling to visit her grandmother 12/27/2011. Review of Systems As above.   Past Medical History  Diagnosis Date  . Diabetes mellitus   . Bipolar disorder   . High cholesterol   . Fatty liver disease, nonalcoholic     Past Surgical History  Procedure Date  . Laparoscopic cholecystectomy 1993  . Dilation and curettage of uterus   . Ercp 03/03/2011    Procedure: ENDOSCOPIC RETROGRADE CHOLANGIOPANCREATOGRAPHY (ERCP);  Surgeon: Michelle Carwin, MD;  Location: [facility] ENDOSCOPY;  Service: Endoscopy;  Laterality: N/A;    Prior to Admission medications   Medication Sig Start Date End Date Taking? Authorizing Provider  aspirin 325 MG tablet Take 325 mg by mouth daily.     Yes Historical Provider, MD  Calcium Carbonate-Vitamin D (OSCAL 500/200 D-3 PO) Take 1 tablet by mouth daily.     Yes Historical Provider, MD  CINNAMON PO Take by mouth.   Yes Historical Provider, MD  clonazePAM (KLONOPIN) 1 MG tablet Take 3 mg by mouth at bedtime.     Yes Historical Provider, MD  divalproex (DEPAKOTE ER) 500 MG 24 hr tablet Take 1,000 mg by mouth at bedtime.    Yes Historical Provider, MD  gabapentin (NEURONTIN) 600 MG tablet Take 600 mg by mouth at bedtime.     Yes Historical Provider, MD  glucose blood test strip Use as instructed 05/30/11 05/29/12 Yes Michelle Musick S Mykle Pascua, PA-C  insulin glargine (LANTUS) 100 UNIT/ML injection 42 Units at bedtime. Increase dose by 2 units every morning  until fasting glucose is below 140 mg/dl. start at 40 units every evening and increase as instructed 12/17/11  Yes Collene Gobble, MD  ipratropium (ATROVENT) 0.03 % nasal spray Place 2 sprays into the nose 2 (two) times daily. 05/30/11 05/29/12 Yes Michelle Dubuque S Rollin Kotowski, PA-C  lamoTRIgine (LAMICTAL) 100 MG tablet Take 100 mg by mouth. One and half tablet in the morning   Yes Historical Provider, MD  metFORMIN (GLUCOPHAGE) 1000 MG tablet Take 1 tablet (1,000 mg total) by mouth 2 (two) times daily with a meal. 04/23/11  Yes Jonita Albee, MD  MILK THISTLE PO Take by mouth.   Yes Historical Provider, MD  Omega-3 Fatty Acids (FISH OIL PO) Take by mouth.   Yes Historical Provider, MD  OXcarbazepine (TRILEPTAL) 150 MG tablet Take 150 mg by mouth at bedtime.     Yes Historical Provider, MD  pregabalin (LYRICA) 75 MG capsule Take 75 mg by mouth daily.     Yes Historical Provider, MD  topiramate (TOPAMAX) 100 MG tablet Take 100 mg by mouth at bedtime.     Yes Historical Provider, MD  cyclobenzaprine (FLEXERIL) 10 MG tablet Take 1 tablet (10 mg total) by mouth 3 (three) times daily as needed for muscle spasms. 12/22/11   Michelle Ericsson Tessa Lerner, PA-C  eletriptan (RELPAX) 40 MG tablet One tablet by mouth at onset of headache. May repeat in  2 hours if headache persists or recurs. may repeat in 2 hours if necessary 10/10/11   Michelle Nay, PA-C  fluticasone [facility]) 50 MCG/ACT nasal spray Place 2 sprays into the nose daily. 05/30/11 05/29/12  Michelle Somers S Athanasios Heldman, PA-C  meloxicam (MOBIC) 15 MG tablet Take 1 tablet (15 mg total) by mouth daily. 12/22/11   Michelle Weesner S Kadir Azucena, PA-C  promethazine (PHENERGAN) 25 MG tablet Take 25 mg by mouth every 6 (six) hours as needed.    Historical Provider, MD    Allergies  Allergen Reactions  . Risperidone And Related Other (See Comments)    liver damage    History   Social History  . Marital Status: Married    Spouse Name: Michelle Torres    Number of Children: 2  . Years of Education: 14    Occupational History  . PROCESSING ASSISTANT    Social History Main Topics  . Smoking status: Never Smoker   . Smokeless tobacco: Never Used  . Alcohol Use: 0.5 oz/week    1 drink(s) per week     once every couple months  . Drug Use: No  . Sexually Active: Yes -- Female partner(s)    Birth Control/ Protection: Post-menopausal   Other Topics Concern  . Not on file   Social History Narrative   Lives with her husband.  Their daughter, is in college in [place], and plans to transfer to [employer].  Her son is at [employer] and lives with them. Her husband has a child from a previous relationship who lives in [place].    Family History  Problem Relation Age of Onset  . Mental illness Father     Manic Depression  . Alcohol abuse Father   . Stroke Father   . Heart disease Father   . Arthritis Son   . Mental illness Son     depression       Objective:   Physical Exam  Blood pressure 118/79, pulse 76, temperature 98.1 F (36.7 C), temperature source Oral, resp. rate 16, height 4\' 11"  (1.499 m), weight 193 lb (87.544 kg), SpO2 97.00%. Body mass index is 38.98 kg/(m^2). Well-developed, well nourished WF who is awake, alert and oriented, in NAD. Accompanied by her husband, Michelle Deutscher. HEENT: Michelle Torres/AT, PERRL, EOMI.  Sclera and conjunctiva are clear.  EAC are patent, TMs are normal in appearance. Nasal mucosa is pink and moist. OP is clear. Neck: supple, non-tender, no lymphadenopathy, thyromegaly. Heart: RRR, no murmur Lungs: normal effort, CTA Abdomen: normo-active bowel sounds, supple, non-tender, no mass or organomegaly. Extremities: no cyanosis, clubbing or edema. Skin: warm and dry without rash. Psychologic: good mood and appropriate affect, normal speech and behavior.  LEFT tib-fib: [facility] reading (PRIMARY) by  Dr. Merla Riches.  Normal tib-fib.     Assessment & Plan:   1. Pain, lower leg  DG Tibia/Fibula Left, cyclobenzaprine (FLEXERIL) 10 MG tablet, meloxicam (MOBIC) 15 MG tablet    Reduce weight bearing with cane, crutches or walker.  Replace naproxen with meloxicam.  RTC if symptoms worsen/persist. I have reviewed and agree with documentation. Robert P. Merla Riches, M.D.

## 2012-01-31 ENCOUNTER — Ambulatory Visit (INDEPENDENT_AMBULATORY_CARE_PROVIDER_SITE_OTHER): Payer: BC Managed Care – PPO | Admitting: Family Medicine

## 2012-01-31 VITALS — BP 110/72 | HR 80 | Temp 98.1°F | Resp 16 | Ht 59.0 in | Wt 196.0 lb

## 2012-01-31 DIAGNOSIS — R5381 Other malaise: Secondary | ICD-10-CM

## 2012-01-31 DIAGNOSIS — IMO0001 Reserved for inherently not codable concepts without codable children: Secondary | ICD-10-CM

## 2012-01-31 DIAGNOSIS — R5383 Other fatigue: Secondary | ICD-10-CM

## 2012-01-31 DIAGNOSIS — M791 Myalgia, unspecified site: Secondary | ICD-10-CM

## 2012-01-31 DIAGNOSIS — G43909 Migraine, unspecified, not intractable, without status migrainosus: Secondary | ICD-10-CM

## 2012-01-31 DIAGNOSIS — E119 Type 2 diabetes mellitus without complications: Secondary | ICD-10-CM

## 2012-01-31 DIAGNOSIS — R51 Headache: Secondary | ICD-10-CM

## 2012-01-31 LAB — POCT CBC
Granulocyte percent: 47.9 %G (ref 37–80)
HCT, POC: 40.8 % (ref 37.7–47.9)
Hemoglobin: 12.9 g/dL (ref 12.2–16.2)
MCV: 90.9 fL (ref 80–97)
POC LYMPH PERCENT: 43.3 %L (ref 10–50)
RDW, POC: 14.4 %

## 2012-01-31 LAB — GLUCOSE, POCT (MANUAL RESULT ENTRY): POC Glucose: 165 mg/dl — AB (ref 70–99)

## 2012-01-31 LAB — POCT GLYCOSYLATED HEMOGLOBIN (HGB A1C): Hemoglobin A1C: 7.3

## 2012-01-31 MED ORDER — ELETRIPTAN HYDROBROMIDE 40 MG PO TABS: 40.0000 mg | ORAL_TABLET | ORAL | Status: DC | PRN

## 2012-01-31 NOTE — Patient Instructions (Signed)
Continue 46 units of lantus.  Check blood sugars 3 times per day.  If any symptomatic low blood sugars - take glucose/candy and recheck to discuss regimen. Fluids, rest, and recheck if not improving in next few days.  Your should receive a call or letter about your lab results within the next week to 10 days.   Return to the clinic or go to the nearest emergency room if any of your symptoms worsen or new symptoms occur.

## 2012-01-31 NOTE — Progress Notes (Signed)
Subjective:    Patient ID: Michelle Torres, female    DOB: August 09, 1960, 51 y.o.   MRN: [id number]  HPI Michelle Torres is a 51 y.o. female Primary provider: Dr. Cleta Alberts Type 2 diabetes. - sugars running higher recently.  In 200's when seen by  Dr. Cleta Alberts in October.  Had increased Lantus to 46, and had readings down into 90's - things were going better. Blood sugar was improving, decreased bacl to 42 units.  Few days ago didn't feel well, blood sugar 240 that morning - fasting.  Headache, no energy, stomach upset.  Stayed out of work. Took extra 10 units lantus yesterday morning, no lows yesterday. Took 46 units of lantus last night. Muscles acheing, not weak.  Blood sugar - 178 this morning. Still some headache, not feeling well.  No fever.  Did have flu shot this year. No symptomatic lows. Usual treatment of migraine - Relpax - but has not taken relpax yet this morning. Last Aic 6.9 on 10/10/11  Review of Systems  Constitutional: Positive for fatigue. Negative for fever and chills.  Respiratory: Negative for cough, chest tightness and shortness of breath.   Cardiovascular: Negative for chest pain.  Musculoskeletal: Positive for myalgias.  Neurological: Positive for headaches.       Objective:   Physical Exam  Vitals reviewed. Constitutional: She is oriented to person, place, and time. She appears well-developed and well-nourished. No distress.  HENT:  Head: Normocephalic and atraumatic.  Right Ear: Hearing, tympanic membrane, external ear and ear canal normal.  Left Ear: Hearing, tympanic membrane, external ear and ear canal normal.  Nose: Nose normal.  Mouth/Throat: Oropharynx is clear and moist. No oropharyngeal exudate.  Eyes: Conjunctivae normal and EOM are normal. Pupils are equal, round, and reactive to light.  Neck: Normal range of motion. Neck supple.  Cardiovascular: Normal rate, regular rhythm, normal heart sounds and intact distal pulses.   No murmur heard. Pulmonary/Chest:  Effort normal and breath sounds normal. No respiratory distress. She has no wheezes. She has no rhonchi.  Abdominal: Soft. Bowel sounds are normal. There is no tenderness.  Lymphadenopathy:    She has no cervical adenopathy.  Neurological: She is alert and oriented to person, place, and time. She has normal strength. No sensory deficit.       No focal weakness.  Skin: Skin is warm and dry. No rash noted.  Psychiatric: She has a normal mood and affect. Her behavior is normal.  previous visit labs:  Results for orders placed in visit on 12/17/11  POCT RAPID STREP A (OFFICE)      Component Value Range   Rapid Strep A Screen Negative  Negative  POCT CBC      Component Value Range   WBC 6.7  4.6 - 10.2 K/uL   Lymph, poc 3.0  0.6 - 3.4   POC LYMPH PERCENT 45.4  10 - 50 %L   MID (cbc) 0.4  0 - 0.9   POC MID % 6.4  0 - 12 %M   POC Granulocyte 3.2  2 - 6.9   Granulocyte percent 48.2  37 - 80 %G   RBC 5.33  4.04 - 5.48 M/uL   Hemoglobin 15.0  12.2 - 16.2 g/dL   HCT, POC 57.8 (*) 46.9 - 47.9 %   MCV 92.2  80 - 97 fL   MCH, POC 28.1  27 - 31.2 pg   MCHC 30.5 (*) 31.8 - 35.4 g/dL   RDW, POC 14.6  Platelet Count, POC 254  142 - 424 K/uL   MPV 8.4  0 - 99.8 fL  GLUCOSE, POCT (MANUAL RESULT ENTRY)      Component Value Range   POC Glucose 179 (*) 70 - 99 mg/dl  COMPREHENSIVE METABOLIC PANEL      Component Value Range   Sodium 142  135 - 145 mEq/L   Potassium 4.2  3.5 - 5.3 mEq/L   Chloride 107  96 - 112 mEq/L   CO2 21  19 - 32 mEq/L   Glucose, Bld 186 (*) 70 - 99 mg/dL   BUN 19  6 - 23 mg/dL   Creat 4.09  8.11 - 9.14 mg/dL   Total Bilirubin 0.3  0.3 - 1.2 mg/dL   Alkaline Phosphatase 89  39 - 117 U/L   AST 27  0 - 37 U/L   ALT 32  0 - 35 U/L   Total Protein 6.7  6.0 - 8.3 g/dL   Albumin 4.1  3.5 - 5.2 g/dL   Calcium 9.4  8.4 - 78.2 mg/dL  VALPROIC ACID LEVEL      Component Value Range   Valproic Acid Lvl 41.9 (*) 50.0 - 100.0 ug/mL  AMYLASE      Component Value Range    Amylase 94  0 - 105 U/L    Results for orders placed in visit on 01/31/12  POCT GLYCOSYLATED HEMOGLOBIN (HGB A1C)      Component Value Range   Hemoglobin A1C 7.3    GLUCOSE, POCT (MANUAL RESULT ENTRY)      Component Value Range   POC Glucose 165 (*) 70 - 99 mg/dl  POCT CBC      Component Value Range   WBC 9.1  4.6 - 10.2 K/uL   Lymph, poc 3.9 (*) 0.6 - 3.4   POC LYMPH PERCENT 43.3  10 - 50 %L   MID (cbc) 0.8  0 - 0.9   POC MID % 8.8  0 - 12 %M   POC Granulocyte 4.4  2 - 6.9   Granulocyte percent 47.9  37 - 80 %G   RBC 4.49  4.04 - 5.48 M/uL   Hemoglobin 12.9  12.2 - 16.2 g/dL   HCT, POC 95.6  21.3 - 47.9 %   MCV 90.9  80 - 97 fL   MCH, POC 28.7  27 - 31.2 pg   MCHC 31.6 (*) 31.8 - 35.4 g/dL   RDW, POC 08.6     Platelet Count, POC 362  142 - 424 K/uL   MPV 8.3  0 - 99.8 fL       Assessment & Plan:  Michelle Torres is a 51 y.o. female 1. Malaise  POCT CBC  2. Myalgia  POCT CBC, CK  3. DM type 2 (diabetes mellitus, type 2)  POCT glycosylated hemoglobin (Hb A1C), POCT glucose (manual entry)  4. Headache  POCT CBC  5. Migraine     Suspect component of malaise with varying blood sugars and possibly viral syndrome, migraine. Reassuring exam, blood count.  Continue 46 units lantus.  Hypoglycemic precautions. rtc if fever, or worsening symptoms.  Ck level pending.  relpax refilled - can take if migraine sx's start this am.   Patient Instructions  Continue 46 units of lantus.  Check blood sugars 3 times per day.  If any symptomatic low blood sugars - take glucose/candy and recheck to discuss regimen. Fluids, rest, and recheck if not improving in next few days.  Your  should receive a call or letter about your lab results within the next week to 10 days.   Return to the clinic or go to the nearest emergency room if any of your symptoms worsen or new symptoms occur.

## 2012-04-11 ENCOUNTER — Ambulatory Visit (INDEPENDENT_AMBULATORY_CARE_PROVIDER_SITE_OTHER): Payer: BC Managed Care – PPO | Admitting: Physician Assistant

## 2012-04-11 VITALS — BP 110/77 | HR 76 | Temp 97.6°F | Resp 16 | Ht 60.5 in | Wt 199.8 lb

## 2012-04-11 DIAGNOSIS — E1149 Type 2 diabetes mellitus with other diabetic neurological complication: Secondary | ICD-10-CM

## 2012-04-11 DIAGNOSIS — J111 Influenza due to unidentified influenza virus with other respiratory manifestations: Secondary | ICD-10-CM

## 2012-04-11 DIAGNOSIS — R112 Nausea with vomiting, unspecified: Secondary | ICD-10-CM

## 2012-04-11 DIAGNOSIS — R51 Headache: Secondary | ICD-10-CM

## 2012-04-11 LAB — POCT URINALYSIS DIPSTICK
Glucose, UA: NEGATIVE
Leukocytes, UA: NEGATIVE
Nitrite, UA: NEGATIVE
Protein, UA: NEGATIVE
Urobilinogen, UA: 1

## 2012-04-11 LAB — COMPREHENSIVE METABOLIC PANEL
ALT: 58 U/L — ABNORMAL HIGH (ref 0–35)
Albumin: 4.2 g/dL (ref 3.5–5.2)
CO2: 26 mEq/L (ref 19–32)
Calcium: 9.8 mg/dL (ref 8.4–10.5)
Chloride: 101 mEq/L (ref 96–112)
Glucose, Bld: 172 mg/dL — ABNORMAL HIGH (ref 70–99)
Potassium: 4 mEq/L (ref 3.5–5.3)
Sodium: 139 mEq/L (ref 135–145)
Total Bilirubin: 0.4 mg/dL (ref 0.3–1.2)
Total Protein: 7 g/dL (ref 6.0–8.3)

## 2012-04-11 LAB — POCT CBC
HCT, POC: 42.2 % (ref 37.7–47.9)
Hemoglobin: 13.5 g/dL (ref 12.2–16.2)
Lymph, poc: 4.4 — AB (ref 0.6–3.4)
MCH, POC: 28.4 pg (ref 27–31.2)
MCHC: 32 g/dL (ref 31.8–35.4)
MCV: 88.9 fL (ref 80–97)
RBC: 4.75 M/uL (ref 4.04–5.48)
WBC: 9.4 10*3/uL (ref 4.6–10.2)

## 2012-04-11 LAB — POCT UA - MICROSCOPIC ONLY: Yeast, UA: NEGATIVE

## 2012-04-11 MED ORDER — OSELTAMIVIR PHOSPHATE 75 MG PO CAPS: 75.0000 mg | ORAL_CAPSULE | Freq: Two times a day (BID) | ORAL | Status: DC

## 2012-04-11 MED ORDER — ONDANSETRON 4 MG PO TBDP
4.0000 mg | ORAL_TABLET | Freq: Once | ORAL | Status: AC
  Administered 2012-04-11: 4 mg via ORAL

## 2012-04-11 MED ORDER — PROMETHAZINE HCL 25 MG PO TABS: 25.0000 mg | ORAL_TABLET | Freq: Four times a day (QID) | ORAL | Status: DC | PRN

## 2012-04-11 MED ORDER — KETOROLAC TROMETHAMINE 60 MG/2ML IM SOLN
60.0000 mg | Freq: Once | INTRAMUSCULAR | Status: AC
  Administered 2012-04-11: 60 mg via INTRAMUSCULAR

## 2012-04-11 NOTE — Progress Notes (Signed)
Subjective:    Patient ID: Michelle Torres, female    DOB: 03/29/1960, 52 y.o.   MRN: [id number]  HPI 52 yr old CF presents with headache X 2 days.  Relpax seemed to help some. Later 2 days ago she developed nausea and vomiting.  Yesterday she had vomiting X 3 and diarrhea X4.  She still feels weak. She still has a migraine.  She has had a low grade temp around 100F.  She did have a flu shot this season.  Review of Systems  All other systems reviewed and are negative.       Objective:   Physical Exam  Nursing note and vitals reviewed. Constitutional: She is oriented to person, place, and time. She appears well-developed and well-nourished.  Morbidly obese  HENT:  Head: Normocephalic and atraumatic.  Cardiovascular: Normal rate, regular rhythm and normal heart sounds.   Pulmonary/Chest: Effort normal and breath sounds normal.  Abdominal: Soft. Bowel sounds are normal. There is no tenderness.  Neurological: She is alert and oriented to person, place, and time.  Sitting in dark room.  Skin: Skin is warm and dry.  Psychiatric: She has a normal mood and affect. Her behavior is normal.   Results for orders placed in visit on 04/11/12  POCT CBC      Result Value Range   WBC 9.4  4.6 - 10.2 K/uL   Lymph, poc 4.4 (*) 0.6 - 3.4   POC LYMPH PERCENT 47.1  10 - 50 %L   MID (cbc) 0.8  0 - 0.9   POC MID % 8.6  0 - 12 %M   POC Granulocyte 4.2  2 - 6.9   Granulocyte percent 44.3  37 - 80 %G   RBC 4.75  4.04 - 5.48 M/uL   Hemoglobin 13.5  12.2 - 16.2 g/dL   HCT, POC 78.2  95.6 - 47.9 %   MCV 88.9  80 - 97 fL   MCH, POC 28.4  27 - 31.2 pg   MCHC 32.0  31.8 - 35.4 g/dL   RDW, POC 21.3     Platelet Count, POC 371  142 - 424 K/uL   MPV 8.8  0 - 99.8 fL  GLUCOSE, POCT (MANUAL RESULT ENTRY)      Result Value Range   POC Glucose 171 (*) 70 - 99 mg/dl  POCT URINALYSIS DIPSTICK      Result Value Range   Color, UA yellow     Clarity, UA clear     Glucose, UA neg     Bilirubin, UA neg     Ketones, UA neg     Spec Grav, UA 1.020     Blood, UA trace-intact     pH, UA 5.5     Protein, UA neg     Urobilinogen, UA 1.0     Nitrite, UA neg     Leukocytes, UA Negative    POCT UA - MICROSCOPIC ONLY      Result Value Range   WBC, Ur, HPF, POC 0-1     RBC, urine, microscopic 0-2     Bacteria, U Microscopic 2-6     Mucus, UA neg     Epithelial cells, urine per micros neg     Crystals, Ur, HPF, POC neg     Casts, Ur, LPF, POC neg     Yeast, UA neg          Assessment & Plan:  Viral syndrome-possibly mild H1N1; will cover for flu.  Toradol IM now for headache. And oral zofran 4mg  now.  She actually feels a little better before leaving office. Meds ordered this encounter  Medications  . oseltamivir (TAMIFLU) 75 MG capsule    Sig: Take 1 capsule (75 mg total) by mouth 2 (two) times daily.    Dispense:  10 capsule    Refill:  0    Order Specific Question:  Supervising Provider    Answer:  DOOLITTLE, ROBERT P [[id number]]  . promethazine (PHENERGAN) 25 MG tablet    Sig: Take 1 tablet (25 mg total) by mouth every 6 (six) hours as needed.    Dispense:  30 tablet    Refill:  1    Order Specific Question:  Supervising Provider    Answer:  DOOLITTLE, ROBERT P [[id number]]  . ketorolac (TORADOL) injection 60 mg    Sig:   . ondansetron (ZOFRAN-ODT) disintegrating tablet 4 mg    Sig:    DM-uncontrolled. Work on [employer] and exercise when feeling better and RTC for 3 month f/up middle to end of March.

## 2012-04-11 NOTE — Patient Instructions (Addendum)
Stay hydrated.

## 2012-04-15 ENCOUNTER — Ambulatory Visit (INDEPENDENT_AMBULATORY_CARE_PROVIDER_SITE_OTHER): Payer: BC Managed Care – PPO | Admitting: Physician Assistant

## 2012-04-15 VITALS — BP 126/85 | HR 70 | Temp 98.1°F | Resp 18 | Ht 60.0 in | Wt 199.0 lb

## 2012-04-15 DIAGNOSIS — Z0271 Encounter for disability determination: Secondary | ICD-10-CM

## 2012-04-15 DIAGNOSIS — R112 Nausea with vomiting, unspecified: Secondary | ICD-10-CM

## 2012-04-15 DIAGNOSIS — R197 Diarrhea, unspecified: Secondary | ICD-10-CM

## 2012-04-15 LAB — POCT CBC
Lymph, poc: 4 — AB (ref 0.6–3.4)
MCH, POC: 28.7 pg (ref 27–31.2)
MCHC: 31.9 g/dL (ref 31.8–35.4)
MCV: 89.9 fL (ref 80–97)
MID (cbc): 0.7 (ref 0–0.9)
POC LYMPH PERCENT: 48.4 %L (ref 10–50)
Platelet Count, POC: 346 10*3/uL (ref 142–424)
RBC: 4.95 M/uL (ref 4.04–5.48)
RDW, POC: 14.4 %
WBC: 8.3 10*3/uL (ref 4.6–10.2)

## 2012-04-15 LAB — POCT URINALYSIS DIPSTICK
Blood, UA: NEGATIVE
Glucose, UA: 100
Spec Grav, UA: 1.015
Urobilinogen, UA: 0.2

## 2012-04-15 NOTE — Patient Instructions (Addendum)
Labs look reassuring today.  It is not uncommon for flu symptoms to linger for 7-10 days.  Continue hydrating well like you have been. Continue to advance your diet as you can tolerate.  Imodium and phenergan for symptoms if needed.  If worsening (fever >101.45F, abdominal pain, worsening vomiting or diarrhea) or not improving, let us know.

## 2012-04-15 NOTE — Progress Notes (Signed)
Subjective:    Patient ID: Michelle Torres, female    DOB: [birth year], 52 y.o.   MRN: [id number]  HPI   Michelle Torres is a pleasant 52 yr old female here with concern for illness.  She was seen here 5 days ago for migraine, nausea, vomiting, diarrhea, possible flu.  Treated empirically with Tamiflu.  Today is the last day of medication, but she still is not feeling 100%.  Her migraine is gone, but she is still with GI symptoms.  Diarrhea x 7 yesterday.  OTC Imodium helps.  NBNB emesis x1 at about 2am today.  Phenergan helps.  Denies abd pain, just cramping.  Denies blood or mucus in stool.  Trying to stay well hydrated.  Appetite is decreased but she continues to eat small amounts.  Chills, tmax 100.52F last night (but she is not sure about the reliability of her thermometer).  Requests new note for work.      Review of Systems  Constitutional: Positive for fever (100.52F last night) and chills.  Respiratory: Positive for cough (slight). Negative for shortness of breath and wheezing.   Cardiovascular: Negative.   Gastrointestinal: Positive for nausea, vomiting and diarrhea. Negative for abdominal pain, constipation and blood in stool.  Musculoskeletal: Negative.   Skin: Negative.   Neurological: Negative for headaches.       Objective:   Physical Exam  Vitals reviewed. Constitutional: She is oriented to person, place, and time. She appears well-developed and well-nourished. No distress.  HENT:  Head: Normocephalic and atraumatic.  Eyes: Conjunctivae are normal. No scleral icterus.  Cardiovascular: Normal rate, regular rhythm and normal heart sounds.   Pulmonary/Chest: Effort normal and breath sounds normal. She has no wheezes. She has no rales.  Abdominal: Soft. Bowel sounds are normal. She exhibits no distension. There is generalized tenderness. There is no rigidity, no rebound, no guarding, no tenderness at McBurney's point and negative Murphy's sign.  Neurological: She is alert and  oriented to person, place, and time.  Skin: Skin is warm and dry.  Psychiatric: She has a normal mood and affect. Her behavior is normal.     Filed Vitals:   04/15/12 0836  BP: 126/85  Pulse: 70  Temp: 98.1 F (36.7 C)  Resp: 18     Results for orders placed in visit on 04/15/12  POCT CBC      Result Value Range   WBC 8.3  4.6 - 10.2 K/uL   Lymph, poc 4.0 (*) 0.6 - 3.4   POC LYMPH PERCENT 48.4  10 - 50 %L   MID (cbc) 0.7  0 - 0.9   POC MID % 8.4  0 - 12 %M   POC Granulocyte 3.6  2 - 6.9   Granulocyte percent 43.2  37 - 80 %G   RBC 4.95  4.04 - 5.48 M/uL   Hemoglobin 14.2  12.2 - 16.2 g/dL   HCT, POC 40.9  81.1 - 47.9 %   MCV 89.9  80 - 97 fL   MCH, POC 28.7  27 - 31.2 pg   MCHC 31.9  31.8 - 35.4 g/dL   RDW, POC 91.4     Platelet Count, POC 346  142 - 424 K/uL   MPV 9.5  0 - 99.8 fL  POCT URINALYSIS DIPSTICK      Result Value Range   Color, UA yellow     Clarity, UA clear     Glucose, UA 100     Bilirubin, UA neg  Ketones, UA neg     Spec Grav, UA 1.015     Blood, UA neg     pH, UA 6.5     Protein, UA neg     Urobilinogen, UA 0.2     Nitrite, UA neg     Leukocytes, UA Negative         Assessment & Plan:  Nausea with vomiting - Plan: POCT CBC, POCT urinalysis dipstick  Diarrhea - Plan: POCT CBC, POCT urinalysis dipstick   Michelle Torres is a pleasant 52 yr old female with nausea, vomiting, diarrhea.  She was seen here five days ago, treated for migraine and empirically for flu.  Feeling somewhat better.  Labs today are reassuring.  Discussed with pt that flu symptoms may last 7-10 days.  Continue hydrating.  Advance diet as tolerated.  Phenergan and Imodium if needed for symptoms.  Discussed RTC and ED precautions.  Pt understands and is in agreement.  Work note extended.

## 2012-05-09 ENCOUNTER — Ambulatory Visit (INDEPENDENT_AMBULATORY_CARE_PROVIDER_SITE_OTHER): Payer: BC Managed Care – PPO | Admitting: Physician Assistant

## 2012-05-09 VITALS — BP 110/70 | HR 86 | Temp 98.5°F | Resp 16 | Ht 60.5 in | Wt 201.0 lb

## 2012-05-09 DIAGNOSIS — E119 Type 2 diabetes mellitus without complications: Secondary | ICD-10-CM

## 2012-05-09 DIAGNOSIS — R5383 Other fatigue: Secondary | ICD-10-CM

## 2012-05-09 DIAGNOSIS — R5381 Other malaise: Secondary | ICD-10-CM

## 2012-05-09 DIAGNOSIS — R197 Diarrhea, unspecified: Secondary | ICD-10-CM

## 2012-05-09 DIAGNOSIS — F319 Bipolar disorder, unspecified: Secondary | ICD-10-CM

## 2012-05-09 LAB — COMPREHENSIVE METABOLIC PANEL
ALT: 69 U/L — ABNORMAL HIGH (ref 0–35)
AST: 47 U/L — ABNORMAL HIGH (ref 0–37)
Albumin: 4.1 g/dL (ref 3.5–5.2)
Alkaline Phosphatase: 108 U/L (ref 39–117)
BUN: 14 mg/dL (ref 6–23)
CO2: 29 mEq/L (ref 19–32)
Calcium: 9.4 mg/dL (ref 8.4–10.5)
Creat: 0.74 mg/dL (ref 0.50–1.10)
Glucose, Bld: 135 mg/dL — ABNORMAL HIGH (ref 70–99)
Total Bilirubin: 0.4 mg/dL (ref 0.3–1.2)
Total Protein: 7.2 g/dL (ref 6.0–8.3)

## 2012-05-09 LAB — POCT CBC
Hemoglobin: 13.5 g/dL (ref 12.2–16.2)
Lymph, poc: 4.4 — AB (ref 0.6–3.4)
MCHC: 31.5 g/dL — AB (ref 31.8–35.4)
MPV: 9 fL (ref 0–99.8)
POC Granulocyte: 3.6 (ref 2–6.9)
POC LYMPH PERCENT: 50.2 %L — AB (ref 10–50)
POC MID %: 8.1 %M (ref 0–12)
RDW, POC: 14.9 %

## 2012-05-09 LAB — LIPID PANEL
HDL: 46 mg/dL (ref >39)
LDL Cholesterol: 140 mg/dL — ABNORMAL HIGH (ref 0–99)

## 2012-05-09 LAB — GLUCOSE, POCT (MANUAL RESULT ENTRY): POC Glucose: 124 mg/dl — AB (ref 70–99)

## 2012-05-09 MED ORDER — GLUCOSE BLOOD VI STRP: ORAL_STRIP | Status: DC

## 2012-05-09 NOTE — Progress Notes (Signed)
Subjective:    Patient ID: Michelle Torres, female    DOB: 03/07/1960, 52 y.o.   MRN: [id number]  HPI This 52 y.o. female presents for evaluation of fatigue.  Extreme fatigue since she had the flu.  Blood sugars are uncontrolled despite increasing insulin doses. "all over the map."  Her husband reports that she's eating well, minimal carbs. Only recently started checking glucose regularly.  Checks glucose once daily, needs more test strips to increase to BID as advised. 170-300 fasting.  Has had a flare of IBS (diarrhea) and intermittent nausea. Unable to sleep through the night, has increased clonazepam from 3 to 4 at HS, sometimes takes another 1/2 tab if she wakes up during the night.  "She can't sleep at night and then can't stay awake during the day." "she falls asleep mid-conversation."  Next eye exam is next month.  Sees psychiatry again in late April.  Past Medical History  Diagnosis Date  . Diabetes mellitus   . Bipolar disorder   . High cholesterol   . Fatty liver disease, nonalcoholic     Past Surgical History  Procedure Laterality Date  . Laparoscopic cholecystectomy  1993  . Dilation and curettage of uterus    . Ercp  03/03/2011    Procedure: ENDOSCOPIC RETROGRADE CHOLANGIOPANCREATOGRAPHY (ERCP);  Surgeon: Hart Carwin, MD;  Location: [facility] ENDOSCOPY;  Service: Endoscopy;  Laterality: N/A;    Prior to Admission medications   Medication Sig Start Date End Date Taking? Authorizing Provider  aspirin 325 MG tablet Take 325 mg by mouth daily.     Yes Historical Provider, MD  Calcium Carbonate-Vitamin D (OSCAL 500/200 D-3 PO) Take 1 tablet by mouth daily.     Yes Historical Provider, MD  CINNAMON PO Take by mouth.   Yes Historical Provider, MD  clonazePAM (KLONOPIN) 1 MG tablet Take 3 mg by mouth at bedtime.     Yes Historical Provider, MD  divalproex (DEPAKOTE ER) 500 MG 24 hr tablet Take 1,000 mg by mouth at bedtime.    Yes Historical Provider, MD  eletriptan (RELPAX) 40  MG tablet One tablet by mouth at onset of headache. May repeat in 2 hours if headache persists or recurs. 01/31/12  Yes Shade Flood, MD  fluticasone (FLONASE) 50 MCG/ACT nasal spray Place 2 sprays into the nose daily. 05/30/11 05/29/12 Yes Jannelle Notaro S Gamble Enderle, PA-C  gabapentin (NEURONTIN) 600 MG tablet Take 600 mg by mouth at bedtime.     Yes Historical Provider, MD  insulin glargine (LANTUS) 100 UNIT/ML injection 42 Units at bedtime. Increase dose by 2 units every morning until fasting glucose is below 140 mg/dl. start at 40 units every evening and increase as instructed 12/17/11  Yes Collene Gobble, MD  ipratropium (ATROVENT) 0.03 % nasal spray Place 2 sprays into the nose 2 (two) times daily. 05/30/11 05/29/12 Yes Rogelio Winbush S Kishaun Erekson, PA-C  lamoTRIgine (LAMICTAL) 100 MG tablet Take 100 mg by mouth. One and half tablet in the morning   Yes Historical Provider, MD  meloxicam (MOBIC) 15 MG tablet Take 1 tablet (15 mg total) by mouth daily. 12/22/11  Yes Lanaiya Lantry S Chia Mowers, PA-C  metFORMIN (GLUCOPHAGE) 1000 MG tablet Take 1 tablet (1,000 mg total) by mouth 2 (two) times daily with a meal. 04/23/11  Yes Jonita Albee, MD  MILK THISTLE PO Take by mouth.   Yes Historical Provider, MD  Omega-3 Fatty Acids (FISH OIL PO) Take by mouth.   Yes Historical Provider, MD  OXcarbazepine (TRILEPTAL)  150 MG tablet Take 150 mg by mouth at bedtime.     Yes Historical Provider, MD  pregabalin (LYRICA) 75 MG capsule Take 75 mg by mouth daily.     Yes Historical Provider, MD  promethazine (PHENERGAN) 25 MG tablet Take 1 tablet (25 mg total) by mouth every 6 (six) hours as needed. 04/11/12  Yes Marzella Schlein McClung, PA-C  topiramate (TOPAMAX) 100 MG tablet Take 100 mg by mouth at bedtime.     Yes Historical Provider, MD  glucose blood test strip Use as instructed 05/30/11 05/29/12  Phala Schraeder Tessa Lerner, PA-C    Allergies  Allergen Reactions  . Risperidone And Related Other (See Comments)    liver damage    History   Social History  .  Marital Status: Married    Spouse Name: Daphine Deutscher Jandreau    Number of Children: 2  . Years of Education: 14   Occupational History  . PROCESSING ASSISTANT    Social History Main Topics  . Smoking status: Never Smoker   . Smokeless tobacco: Never Used  . Alcohol Use: 0.5 oz/week    1 drink(s) per week     Comment: once every couple months  . Drug Use: No  . Sexually Active: Yes -- Female partner(s)    Birth Control/ Protection: Post-menopausal   Other Topics Concern  . Not on file   Social History Narrative   Lives with her husband.  Their daughter, is in college in [place], and plans to transfer to [employer].  Her son is at [employer] [facility] and lives with them. Her husband has a child from a previous relationship who lives in [place].    Family History  Problem Relation Age of Onset  . Mental illness Father     Manic Depression  . Alcohol abuse Father   . Stroke Father   . Heart disease Father   . Arthritis Son   . Mental illness Son     depression     Review of Systems As above.    Objective:   Physical Exam  Blood pressure 110/70, pulse 86, temperature 98.5 F (36.9 C), temperature source Oral, resp. rate 16, height 5' 0.5" (1.537 m), weight 201 lb (91.173 kg), SpO2 98.00%. Body mass index is 38.59 kg/(m^2). Well-developed, well nourished WF who is awake, alert and oriented, in NAD. HEENT: [place]/AT, PERRL, EOMI.  Sclera and conjunctiva are clear.  EAC are patent, TMs are normal in appearance. Nasal mucosa is pink and moist. OP is clear. Neck: supple, non-tender, no lymphadenopathy, thyromegaly. Heart: RRR, no murmur Lungs: normal effort, CTA Abdomen: normo-active bowel sounds, supple, non-tender, no mass or organomegaly. Extremities: no cyanosis, clubbing or edema. Skin: warm and dry without rash. Psychologic: good mood and appropriate affect, normal speech and behavior.   Results for orders placed in visit on 05/09/12  POCT CBC      Result Value Range   WBC 8.7  4.6 - 10.2 K/uL    Lymph, poc 4.4 (*) 0.6 - 3.4   POC LYMPH PERCENT 50.2 (*) 10 - 50 %L   MID (cbc) 0.7  0 - 0.9   POC MID % 8.1  0 - 12 %M   POC Granulocyte 3.6  2 - 6.9   Granulocyte percent 41.7  37 - 80 %G   RBC 4.80  4.04 - 5.48 M/uL   Hemoglobin 13.5  12.2 - 16.2 g/dL   HCT, POC 78.2  95.6 - 47.9 %   MCV 89.4  80 -  97 fL   MCH, POC 28.1  27 - 31.2 pg   MCHC 31.5 (*) 31.8 - 35.4 g/dL   RDW, POC 69.6     Platelet Count, POC 335  142 - 424 K/uL   MPV 9.0  0 - 99.8 fL  GLUCOSE, POCT (MANUAL RESULT ENTRY)      Result Value Range   POC Glucose 124 (*) 70 - 99 mg/dl  POCT GLYCOSYLATED HEMOGLOBIN (HGB A1C)      Result Value Range   Hemoglobin A1C 9.8         Assessment & Plan:  DM type 2 (diabetes mellitus, type 2) - Plan: POCT glucose (manual entry), POCT glycosylated hemoglobin (Hb A1C), Microalbumin, urine, Comprehensive metabolic panel, Lipid panel; glucose blood test strip  Diarrhea - IBS flare.  Doubt related to metformin, as she has been tolerating it prior to now.  Fatigue - Plan: POCT CBC, TSH, Epstein-Barr virus VCA antibody panel  Bipolar 1 disorder - follow-up with psychiatry as planned.  Patient Instructions  You should be taking metformin twice each day.  If you are already taking it twice daily, call me so I can add another medication.  If not, start, and we'll check in 3 months. Check your blood sugar twice daily and record the results.  Once fasting, and once 2-3 hours after your largest meal of the day. Drink at least 64 ounces of water daily. WALK 30 minutes every day, even if you feel too tired to do it.

## 2012-05-09 NOTE — Patient Instructions (Signed)
You should be taking metformin twice each day.  If you are already taking it twice daily, call me so I can add another medication.  If not, start, and we'll check in 3 months. Check your blood sugar twice daily and record the results.  Once fasting, and once 2-3 hours after your largest meal of the day. Drink at least 64 ounces of water daily. WALK 30 minutes every day, even if you feel too tired to do it.

## 2012-05-10 ENCOUNTER — Encounter: Payer: Self-pay | Admitting: Physician Assistant

## 2012-05-12 LAB — EPSTEIN-BARR VIRUS VCA ANTIBODY PANEL
EBV VCA IgG: 317 U/mL — ABNORMAL HIGH (ref <18.0)
EBV VCA IgM: <10 U/mL (ref <36.0)

## 2012-05-13 ENCOUNTER — Other Ambulatory Visit: Payer: Self-pay | Admitting: Physician Assistant

## 2012-05-13 MED ORDER — DICYCLOMINE HCL 10 MG PO CAPS: 10.0000 mg | ORAL_CAPSULE | Freq: Three times a day (TID) | ORAL | Status: DC

## 2012-05-19 ENCOUNTER — Ambulatory Visit (INDEPENDENT_AMBULATORY_CARE_PROVIDER_SITE_OTHER): Payer: BC Managed Care – PPO | Admitting: Physician Assistant

## 2012-05-19 VITALS — BP 108/68 | HR 80 | Temp 98.7°F | Resp 16 | Ht <= 58 in | Wt 201.6 lb

## 2012-05-19 DIAGNOSIS — L918 Other hypertrophic disorders of the skin: Secondary | ICD-10-CM

## 2012-05-19 DIAGNOSIS — E119 Type 2 diabetes mellitus without complications: Secondary | ICD-10-CM

## 2012-05-19 DIAGNOSIS — L909 Atrophic disorder of skin, unspecified: Secondary | ICD-10-CM

## 2012-05-19 DIAGNOSIS — R197 Diarrhea, unspecified: Secondary | ICD-10-CM

## 2012-05-19 DIAGNOSIS — B2790 Infectious mononucleosis, unspecified without complication: Secondary | ICD-10-CM

## 2012-05-19 DIAGNOSIS — R1084 Generalized abdominal pain: Secondary | ICD-10-CM

## 2012-05-19 LAB — COMPREHENSIVE METABOLIC PANEL
Albumin: 3.7 g/dL (ref 3.5–5.2)
Alkaline Phosphatase: 106 U/L (ref 39–117)
BUN: 9 mg/dL (ref 6–23)
CO2: 28 mEq/L (ref 19–32)
Glucose, Bld: 141 mg/dL — ABNORMAL HIGH (ref 70–99)
Sodium: 138 mEq/L (ref 135–145)
Total Bilirubin: 0.3 mg/dL (ref 0.3–1.2)
Total Protein: 6.5 g/dL (ref 6.0–8.3)

## 2012-05-19 NOTE — Progress Notes (Signed)
Subjective:    Patient ID: Michelle Torres, female    DOB: 15-Jul-1960, 52 y.o.   MRN: [id number]  HPI This 52 y.o. female presents for evaluation of extreme fatigue.  Locked self out of My Chart, so did not get my message about her lab results. Cholesterol is elevated, likely due to uncontrolled DM.  She no longer takes a statin due to elevation of LFTs 02/2011. EBV titer revealed early Mono infection.   Also, did not get Bentyl at the pharmacy.  Abdominal cramping has persisted, and she rates it 7/10.  Loose stools. No blood or mucous.  Has increased the metformin to 1000 mg BID (recall that the loose stools and abdominal cramping began first).  Past Medical History  Diagnosis Date  . Diabetes mellitus   . Bipolar disorder   . High cholesterol   . Fatty liver disease, nonalcoholic     Past Surgical History  Procedure Laterality Date  . Laparoscopic cholecystectomy  1993  . Dilation and curettage of uterus    . Ercp  03/03/2011    Procedure: ENDOSCOPIC RETROGRADE CHOLANGIOPANCREATOGRAPHY (ERCP);  Surgeon: Hart Carwin, MD;  Location: [facility] ENDOSCOPY;  Service: Endoscopy;  Laterality: N/A;    Prior to Admission medications   Medication Sig Start Date End Date Taking? Authorizing Provider  aspirin 325 MG tablet Take 325 mg by mouth daily.     Yes Historical Provider, MD  Calcium Carbonate-Vitamin D (OSCAL 500/200 D-3 PO) Take 1 tablet by mouth daily.     Yes Historical Provider, MD  CINNAMON PO Take by mouth.   Yes Historical Provider, MD  clonazePAM (KLONOPIN) 1 MG tablet Take 3 mg by mouth at bedtime.     Yes Historical Provider, MD  divalproex (DEPAKOTE ER) 500 MG 24 hr tablet Take 1,000 mg by mouth at bedtime.    Yes Historical Provider, MD  eletriptan (RELPAX) 40 MG tablet One tablet by mouth at onset of headache. May repeat in 2 hours if headache persists or recurs. 01/31/12  Yes Shade Flood, MD  fluticasone (FLONASE) 50 MCG/ACT nasal spray Place 2 sprays into the nose  daily. 05/30/11 05/29/12 Yes Alexys Lobello S Sariah Henkin, PA-C  gabapentin (NEURONTIN) 600 MG tablet Take 600 mg by mouth at bedtime.     Yes Historical Provider, MD  glucose blood test strip Use as instructed 05/09/12 05/09/13 Yes Hawraa Stambaugh S Marquee Fuchs, PA-C  insulin glargine (LANTUS) 100 UNIT/ML injection 50 Units at bedtime. Increase dose by 2 units every morning until fasting glucose is below 140 mg/dl. start at 40 units every evening and increase as instructed 12/17/11  Yes Collene Gobble, MD  ipratropium (ATROVENT) 0.03 % nasal spray Place 2 sprays into the nose 2 (two) times daily. 05/30/11 05/29/12 Yes Patrice Moates S Eliab Closson, PA-C  lamoTRIgine (LAMICTAL) 100 MG tablet Take 100 mg by mouth. One and half tablet in the morning   Yes Historical Provider, MD  metFORMIN (GLUCOPHAGE) 1000 MG tablet Take 1 tablet (1,000 mg total) by mouth 2 (two) times daily with a meal. 04/23/11  Yes Jonita Albee, MD  MILK THISTLE PO Take by mouth.   Yes Historical Provider, MD  Omega-3 Fatty Acids (FISH OIL PO) Take by mouth.   Yes Historical Provider, MD  OXcarbazepine (TRILEPTAL) 150 MG tablet Take 150 mg by mouth at bedtime.     Yes Historical Provider, MD  pregabalin (LYRICA) 75 MG capsule Take 75 mg by mouth daily.     Yes Historical Provider, MD  promethazine (  PHENERGAN) 25 MG tablet Take 1 tablet (25 mg total) by mouth every 6 (six) hours as needed. 04/11/12  Yes Marzella Schlein McClung, PA-C  topiramate (TOPAMAX) 100 MG tablet Take 100 mg by mouth at bedtime.     Yes Historical Provider, MD  dicyclomine (BENTYL) 10 MG capsule Take 1-2 capsules (10-20 mg total) by mouth 4 (four) times daily -  before meals and at bedtime. 05/13/12  NO Teniya Filter S Kevontay Burks, PA-C  meloxicam (MOBIC) 15 MG tablet Take 1 tablet (15 mg total) by mouth daily. 12/22/11  NO Isaac Lacson Tessa Lerner, PA-C    Allergies  Allergen Reactions  . Risperidone And Related Other (See Comments)    liver damage    History   Social History  . Marital Status: Married    Spouse Name:  Daphine Deutscher Hollen    Number of Children: 2  . Years of Education: 14   Occupational History  . PROCESSING ASSISTANT    Social History Main Topics  . Smoking status: Never Smoker   . Smokeless tobacco: Never Used  . Alcohol Use: 0.5 oz/week    1 drink(s) per week     Comment: once every couple months  . Drug Use: No  . Sexually Active: Yes -- Female partner(s)    Birth Control/ Protection: Post-menopausal   Other Topics Concern  . Not on file   Social History Narrative   Lives with her husband.  Their daughter, is in college in [place], and plans to transfer to [employer].  Her son is at [employer] and lives with them. Her husband has a child from a previous relationship who lives in [place].    Family History  Problem Relation Age of Onset  . Mental illness Father     Manic Depression  . Alcohol abuse Father   . Stroke Father   . Heart disease Father   . Arthritis Son   . Mental illness Son     depression    Review of Systems As above. Additionally, she has a tender bump on the RIGHT neck, that got caught on a necklace and has been bleeding.  As it remains tender, she would like it removed.    Objective:   Physical Exam Blood pressure 108/68, pulse 80, temperature 98.7 F (37.1 C), temperature source Oral, resp. rate 16, height 4' 1.75" (1.264 m), weight 201 lb 9.6 oz (91.445 kg), SpO2 98.00%. Body mass index is 57.24 kg/(m^2). Well-developed, well nourished WF who is awake, alert and oriented, in NAD. HEENT: /AT, PERRL, EOMI.  Sclera and conjunctiva are clear.  EAC are patent, TMs are normal in appearance. Nasal mucosa is pink and moist. OP is clear. Neck: supple, non-tender, no lymphadenopathy, thyromegaly. Heart: RRR, no murmur Lungs: normal effort, CTA Abdomen: normo-active bowel sounds, supple, non-tender, no mass or organomegaly. Extremities: no cyanosis, clubbing or edema. Skin: warm and dry without rash. There is a raised, pedunculated lesion, slightly hyperpigmented, on the  right neck.  The surface is excoriated and the base is mildly erythematous. Psychologic: good mood and appropriate affect, normal speech and behavior.   PROCEDURE:  Verbal consent obtained.  Local anesthesia with 0.5 cc 2% lidocaine with epi.  Sterile Prep.  Lesion removed with 15 blade.  Hemostasis achieved with pressure.  Band Aid applied.    Assessment & Plan:  DM type 2 (diabetes mellitus, type 2) - continue current treatment, including the increased (prescribed) dose of metformin.  Diarrhea - stay hydrated.  Consume foods containing fiber to provide  bulk.  Generalized abdominal cramping - fill the Bentyl Rx I sent last week.  Let me know if it is ineffective.  Infectious mononucleosis - Plan: Comprehensive metabolic panel; continue rest and hydration.  Anticipatory guidance provided.  RTC 2 weeks.  Inflamed skin tag - removed as described above.

## 2012-05-19 NOTE — Patient Instructions (Addendum)
WOUND CARE This evening, remove bandage and wash wound gently with mild soap and warm water. Reapply a new bandage after cleaning wound, if directed. . Continue daily cleansing with soap and water until healed. . You are encouraged to apply an antibiotic ointment to the wound . Notify the office if you experience any of the following signs of infection: Swelling, redness, pus drainage, streaking, fever >101.0 F . Notify the office if you experience excessive bleeding that does not stop after 15-20 minutes of constant, firm pressure.

## 2012-06-15 ENCOUNTER — Ambulatory Visit (INDEPENDENT_AMBULATORY_CARE_PROVIDER_SITE_OTHER): Payer: BC Managed Care – PPO | Admitting: Physician Assistant

## 2012-06-15 VITALS — BP 124/79 | HR 79 | Temp 98.3°F | Resp 12 | Ht 60.75 in | Wt 203.0 lb

## 2012-06-15 DIAGNOSIS — M25579 Pain in unspecified ankle and joints of unspecified foot: Secondary | ICD-10-CM

## 2012-06-15 DIAGNOSIS — E119 Type 2 diabetes mellitus without complications: Secondary | ICD-10-CM

## 2012-06-15 DIAGNOSIS — B2790 Infectious mononucleosis, unspecified without complication: Secondary | ICD-10-CM

## 2012-06-15 DIAGNOSIS — R197 Diarrhea, unspecified: Secondary | ICD-10-CM

## 2012-06-15 LAB — POCT CBC
HCT, POC: 40.6 % (ref 37.7–47.9)
Hemoglobin: 12.6 g/dL (ref 12.2–16.2)
Lymph, poc: 2.8 (ref 0.6–3.4)
MCH, POC: 27.6 pg (ref 27–31.2)
MCHC: 31 g/dL — AB (ref 31.8–35.4)
POC Granulocyte: 4.8 (ref 2–6.9)
POC LYMPH PERCENT: 33.7 %L (ref 10–50)
POC MID %: 9.2 %M (ref 0–12)
RDW, POC: 14.7 %
WBC: 8.4 10*3/uL (ref 4.6–10.2)

## 2012-06-15 LAB — COMPREHENSIVE METABOLIC PANEL
BUN: 15 mg/dL (ref 6–23)
CO2: 27 mEq/L (ref 19–32)
Calcium: 9.5 mg/dL (ref 8.4–10.5)
Chloride: 102 mEq/L (ref 96–112)
Creat: 0.73 mg/dL (ref 0.50–1.10)
Total Bilirubin: 0.3 mg/dL (ref 0.3–1.2)

## 2012-06-15 NOTE — Progress Notes (Signed)
Subjective:    Patient ID: Michelle Torres, female    DOB: [birth year], 52 y.o.   MRN: [id number]  HPI This 52 y.o. female presents for evaluation of recently elevated LFTs associated with an early mono infection.  She was to return several weeks ago, but didn't.  She is accompanied today by her husband, who is also being evaluated today. Still exhausted.  Requests that I check her ear, nose and throat, with recent increased pollen exposure.  Husband has a sinus infection.  Has gained 4 pounds since her last visit. Home glucose testing improved after her last visit, but now is checking once daily (fasting): 80-147 (usually <130) (ate ice cream and drank alcohol last night). Has resumed taking the metformin BID.   Diarrhea persists, but has lessened to 4 bouts each day with the use of Bentyl, which has resolved the cramping.  She had a brief increase in diarrhea with the increase in metformin dose, but then it reduced again to baseline.  LEFT ankle pain with ambulation.  No trauma or injury.  No swelling, redness.  This is the foot on which she had hammer toe correction 08/2011.  Review of Systems As above.     Objective:   Physical Exam Blood pressure 124/79, pulse 79, temperature 98.3 F (36.8 C), temperature source Oral, resp. rate 12, height 5' 0.75" (1.543 m), weight 203 lb (92.08 kg), SpO2 98.00%. Body mass index is 38.68 kg/(m^2). Well-developed, well nourished WF who is awake, alert and oriented, in NAD. HEENT: [place]/AT, PERRL, EOMI.  Sclera and conjunctiva are clear.  EAC are patent, TMs are normal in appearance. Nasal mucosa is pink and moist. OP is clear. Neck: supple, non-tender, no lymphadenopathy, thyromegaly. Heart: RRR, no murmur Lungs: normal effort, CTA Abdomen: normo-active bowel sounds, supple, non-tender, no mass or organomegaly. Extremities: no cyanosis, clubbing or edema. Left ankle non-tender on palpation.  FROM. No swelling, erythema. Skin: warm and dry without  rash. Psychologic: good mood and appropriate affect, normal speech and behavior.   Results for orders placed in visit on 06/15/12  POCT CBC      Result Value Range   WBC 8.4  4.6 - 10.2 K/uL   Lymph, poc 2.8  0.6 - 3.4   POC LYMPH PERCENT 33.7  10 - 50 %L   MID (cbc) 0.8  0 - 0.9   POC MID % 9.2  0 - 12 %M   POC Granulocyte 4.8  2 - 6.9   Granulocyte percent 57.1  37 - 80 %G   RBC 4.56  4.04 - 5.48 M/uL   Hemoglobin 12.6  12.2 - 16.2 g/dL   HCT, POC 82.9  56.2 - 47.9 %   MCV 89.0  80 - 97 fL   MCH, POC 27.6  27 - 31.2 pg   MCHC 31.0 (*) 31.8 - 35.4 g/dL   RDW, POC 13.0     Platelet Count, POC 275  142 - 424 K/uL   MPV 8.1  0 - 99.8 fL  GLUCOSE, POCT (MANUAL RESULT ENTRY)      Result Value Range   POC Glucose 136 (*) 70 - 99 mg/dl       Assessment & Plan:  Diarrhea - Plan: Comprehensive metabolic panel, Stool culture, Ova and parasite screen, Clostridium difficile EIA; if studies are normal and diarrhea persists, will refer to GI.  Diabetes mellitus, type 2 - Plan: POCT glucose (manual entry); counseled on healthy choices and regular home glucose monitoring.  Infectious mononucleosis -  Plan: POCT CBC, Comprehensive metabolic panel; continue supportive care.  Pain in joint, ankle and foot, unspecified laterality - plan: exercises given for home.  If persists, plan xray and consider referral to orthopedics.  Fernande Bras, PA-C Physician Assistant-Certified [facility]

## 2012-06-16 LAB — CLOSTRIDIUM DIFFICILE EIA: CDIFTX: NEGATIVE

## 2012-06-20 LAB — STOOL CULTURE

## 2012-06-20 LAB — OVA AND PARASITE SCREEN: OP: NONE SEEN

## 2012-07-04 ENCOUNTER — Ambulatory Visit (INDEPENDENT_AMBULATORY_CARE_PROVIDER_SITE_OTHER): Payer: BC Managed Care – PPO | Admitting: Physician Assistant

## 2012-07-04 VITALS — BP 113/71 | HR 80 | Temp 99.0°F | Resp 18 | Wt 201.0 lb

## 2012-07-04 DIAGNOSIS — B2790 Infectious mononucleosis, unspecified without complication: Secondary | ICD-10-CM

## 2012-07-04 DIAGNOSIS — E119 Type 2 diabetes mellitus without complications: Secondary | ICD-10-CM

## 2012-07-04 DIAGNOSIS — R197 Diarrhea, unspecified: Secondary | ICD-10-CM

## 2012-07-04 DIAGNOSIS — M25579 Pain in unspecified ankle and joints of unspecified foot: Secondary | ICD-10-CM

## 2012-07-04 NOTE — Progress Notes (Signed)
  Subjective:    Patient ID: Michelle Torres, female    DOB: 1960-09-18, 52 y.o.   MRN: [id number]  HPI Presents for follow-up, accompanying her daughter for depo-provera injection.  Review of the record indicates that her follow-up labs aren't due for another several weeks.  Had a migraine HA yesterday, and insomnia last night.  Relieved with Relpax.  Sleeping better with clonazepam in general (last night was an anomaly).  Starting to see some improvement in her energy since recent mononucleosis infection. Diarrhea is considerably better. Overall, she feels really good today, and has no new complaints.  Swelling in the foot is resolved.  Some residual pain at the RIGHT 1st MTP, for which she has declined surgical intervention.  Past medical history, surgical history, family history, social history and problem list reviewed.   Review of Systems As above.    Objective:   Physical Exam  Blood pressure 113/71, pulse 80, temperature 99 F (37.2 C), temperature source Oral, resp. rate 18, weight 201 lb (91.173 kg). Body mass index is 38.29 kg/(m^2). Well-developed, well nourished WF who is awake, alert and oriented, in NAD. HEENT: [place]/AT, sclera and conjunctiva are clear.  EAC are patent, TMs are normal in appearance. Nasal mucosa is pink and moist. OP is clear. Neck: supple, non-tender, no lymphadenopathy, thyromegaly. Heart: RRR, no murmur Lungs: normal effort, CTA Extremities: no cyanosis, clubbing or edema. Skin: warm and dry without rash. Psychologic: good mood and appropriate affect, normal speech and behavior.      Assessment & Plan:  Diarrhea - resolving  Diabetes mellitus, type 2 - improving control.    Infectious mononucleosis - resolving  Pain in joint, ankle and foot, unspecified laterality - stable   She'll return in about 4 weeks for follow-up glucose, A1C, CMET, lipids.  Fernande Bras, PA-C Physician Assistant-Certified [facility]

## 2012-07-04 NOTE — Patient Instructions (Signed)
Keep up the great work!

## 2012-07-30 ENCOUNTER — Ambulatory Visit (INDEPENDENT_AMBULATORY_CARE_PROVIDER_SITE_OTHER): Payer: BC Managed Care – PPO | Admitting: Physician Assistant

## 2012-07-30 ENCOUNTER — Encounter: Payer: Self-pay | Admitting: Gastroenterology

## 2012-07-30 VITALS — BP 122/74 | HR 94 | Temp 97.7°F | Resp 20 | Ht <= 58 in | Wt 198.2 lb

## 2012-07-30 DIAGNOSIS — R197 Diarrhea, unspecified: Secondary | ICD-10-CM

## 2012-07-30 DIAGNOSIS — R5383 Other fatigue: Secondary | ICD-10-CM

## 2012-07-30 DIAGNOSIS — E119 Type 2 diabetes mellitus without complications: Secondary | ICD-10-CM

## 2012-07-30 DIAGNOSIS — R5381 Other malaise: Secondary | ICD-10-CM

## 2012-07-30 DIAGNOSIS — R112 Nausea with vomiting, unspecified: Secondary | ICD-10-CM

## 2012-07-30 DIAGNOSIS — G43909 Migraine, unspecified, not intractable, without status migrainosus: Secondary | ICD-10-CM

## 2012-07-30 DIAGNOSIS — M25579 Pain in unspecified ankle and joints of unspecified foot: Secondary | ICD-10-CM

## 2012-07-30 DIAGNOSIS — R1084 Generalized abdominal pain: Secondary | ICD-10-CM

## 2012-07-30 DIAGNOSIS — Z0271 Encounter for disability determination: Secondary | ICD-10-CM

## 2012-07-30 MED ORDER — MELOXICAM 15 MG PO TABS: 15.0000 mg | ORAL_TABLET | Freq: Every day | ORAL | Status: DC

## 2012-07-30 MED ORDER — PROMETHAZINE HCL 25 MG PO TABS: 25.0000 mg | ORAL_TABLET | Freq: Four times a day (QID) | ORAL | Status: DC | PRN

## 2012-07-30 NOTE — Progress Notes (Signed)
  Subjective:    Patient ID: Michelle Torres, female    DOB: October 06, 1960, 52 y.o.   MRN: [id number]  HPI  Pt with history of bipolar disorder & DM - presents with fatigue, abdominal cramping, nausea, vomiting, diarrhea, and migraine x 5 days. Has been seen before for these symptoms - which have been improved since the last visit until 5 days ago. She believes improvement is related to bentyl use. Diarrhea started 5 days ago and improved with OTC anti-diarrheal agent. Migraine began 3 days ago with nausea and vomiting, the headache pain has improved some over the past few days but the nausea/vomiting has persisted and the abdominal cramping and diarrhea has returned. Pt also reports 1 day of fevers of 101. Reports she is ready to proceed to the "next step" of GI referral or colonoscopy. Morning glucose levels have dropped from 230 range to the 102 range. States she needs to be more diligent about monitoring glucose 2 hours post evening meal. Denies chills, melena, hematochezia, urinary symptoms.   Unrelated to the CC, pt complains of right lateral foot pain at rest and during walking. No recent injury. Denies erythema or edema. No different shoes or activities.   Review of Systems Denies: vision changes, SOB, chest pain, urinary symptoms.     Objective:   Physical Exam  General: appears stated age, appears mildly uncomfortable  HEENT: normocephalic, atraumatic, PERLA, conjunctiva clear  Resp: clear to auscultation anterior and posterior fields bilaterally, no rales/rhonchi/wheezes Cardiac: RRR, no murmurs/rubs/gallops Abdomen: soft, non distended, diffusely tender to light and deep palpation especially in epigastric area, normal bowel sounds Extremities: moves all limbs spontaneously, right lateral foot is tender to palpation but without edema or erythema, full ROM in ankle and foot bilaterally, 5/5 strength bilaterally in ankle and foot, and dorsalis pedis pulse 2+ bilaterally  Neuro: alert &  oriented x 3, cranial nerves II-XII intact Skin: no rashes, lesions.       Assessment & Plan:  Diarrhea - Plan: Ambulatory referral to Gastroenterology  Nausea with vomiting - Plan: promethazine (PHENERGAN) 25 MG tablet  Generalized abdominal cramping - Plan: Ambulatory referral to Gastroenterology  Pain in joint, ankle and foot, unspecified laterality - Plan: meloxicam (MOBIC) 15 MG tablet  Migraine - resolving.  Promethazine will help her rest.  Fatigue - chronic since diagnosis of mono during the late winter.  Likely exacerbated by current acute illness.  Diabetes mellitus, type 2 - improving with routine use of prescribed medications and healthier eating choices.   Questioning etiology of abdominal pain, diarrhea and nausea/vomiting: Most likely viral gastroenteritis exacerbating possible underlying IBS vs GERD; while diarrhea is likely exacerbated by the metformin, it had resolved prior to this episode and therefore unlikely to be the primary cause of her current symptoms.     Suspect right lateral foot pain is tendonitis - will treat with meloxicam and rest and monitor for improvement

## 2012-07-30 NOTE — Progress Notes (Signed)
I have examined this patient along with the student and agree.  

## 2012-07-30 NOTE — Patient Instructions (Addendum)
If you are not able to return to work on Monday 08/04/2012, please return for re-evaluation. Get plenty of rest and drink at least 64 ounces of water daily.

## 2012-07-31 ENCOUNTER — Telehealth: Payer: Self-pay

## 2012-07-31 NOTE — Telephone Encounter (Signed)
Patient is following up on FMLA paperwork she dropped off yesterday. Notified her of 5-7 business day processing time but she says she needs it by Monday. Paperwork is in [employer]. Patient would like Chelle to call her back ASAP about this.  Best [phone]

## 2012-07-31 NOTE — Telephone Encounter (Signed)
Patient's husband Michelle Torres called and request call back from [place] only. He needs to discuss  Something private about his wife. He stated she was seen yesterday.  [phone]

## 2012-08-04 ENCOUNTER — Telehealth: Payer: Self-pay

## 2012-08-04 NOTE — Telephone Encounter (Signed)
Patient is calling to see when her appointment to a specialist is. Work number [phone]. Home [phone]. If you call between 8-5 her work number is best to reach her and after 5:30 home number is best.

## 2012-08-04 NOTE — Telephone Encounter (Signed)
I completed the FMLA forms and put them in Maudia's box on 07/31/2012.  Please call the patient/husband and get what details you can.  Apologize for the delay, as I was out of the office Friday-Sunday.

## 2012-08-05 NOTE — Telephone Encounter (Signed)
Please also let patient know appt scheduled: [facility] July 2, 2:30P.M.       Status: Signed            Patient is calling to see when her appointment to a specialist is. Work number [phone]. Home [phone]. If you call between 8-5 her work number is best to reach her and after 5:30 home number is best.

## 2012-08-06 NOTE — Telephone Encounter (Signed)
Called and she knows about the appt, received letter yesterday.

## 2012-08-20 ENCOUNTER — Encounter: Payer: Self-pay | Admitting: Gastroenterology

## 2012-08-20 ENCOUNTER — Ambulatory Visit (INDEPENDENT_AMBULATORY_CARE_PROVIDER_SITE_OTHER): Payer: BC Managed Care – PPO | Admitting: Gastroenterology

## 2012-08-20 ENCOUNTER — Other Ambulatory Visit: Payer: BC Managed Care – PPO

## 2012-08-20 VITALS — BP 96/72 | HR 88 | Ht 59.25 in | Wt 197.4 lb

## 2012-08-20 DIAGNOSIS — R197 Diarrhea, unspecified: Secondary | ICD-10-CM

## 2012-08-20 DIAGNOSIS — R109 Unspecified abdominal pain: Secondary | ICD-10-CM

## 2012-08-20 MED ORDER — CHOLESTYRAMINE 4 GM/DOSE PO POWD: 4.0000 g | Freq: Every day | ORAL | Status: DC

## 2012-08-20 MED ORDER — PEG-KCL-NACL-NASULF-NA ASC-C 100 G PO SOLR: 1.0000 | Freq: Once | ORAL | Status: DC

## 2012-08-20 NOTE — Patient Instructions (Addendum)
Trial of cholestyramine, new prescription called in. Take one dose once daily. You will be set up for a colonoscopy (LEC, MAC sedation); for diarrhea, abdominal pain. You will have labs checked today in the basement lab.  Please head down after you check out with the front desk  (celiac sprue).  X ORAL DIABETIC MEDICATION INSTRUCTIONS  The day before your procedure:  Take your diabetic pill as you do normally  The day of your procedure:  Do not take your diabetic pill   We will check your blood sugar levels during the admission process and again in Recovery before discharging you home  ________________________________________________________________________  X   INSULIN (LONG ACTING) MEDICATION INSTRUCTIONS (Lantus, NPH, 70/30, Humulin, Novolin-N)   The day before your procedure:  Take  your regular evening dose    The day of your procedure:  Do not take your morning dose

## 2012-08-20 NOTE — Progress Notes (Signed)
Review of pertinent gastrointestinal problems: 1. ERCP with Dr. Juanda Chance 02/2011: indications: "abnormal imaging, abnormal Liver Function Tests, abdominal pain hx lap chole 19 years ago,,now RUQ abd. pain,total bili up to 4.0, today down to 2.0, suspect choledocholithiasis, ? passed a stone, she is having less pain today, CBD dilated to 12 mm ( was dilated in 2008)"; ERCP sphincterotomy with balloon sweeping, no stones seen. 2. Abnormal liver tests evaluated 2008t: initially found to have elevated transaminases January 2008. AST 158, ALT 166. Total bilirubin, alkaline phosphate and albumin were all normal. An ultrasound 2/08 showed a diffusely increased echo texture consistent with fatty infiltration. Her CBD was 12 mm. No stones in her bile duct. Gallbladder had been surgically removed. Workup for abnormal liver test include ANA, antismooth muscle antibody, AMA, hepatitis A, B, C; all these tests were negative. TSH was normal.   HPI: This is a    very pleasant 52 year old woman whom I last saw 5-6 years ago.   C. Diff, ova parasites, routine culture all negative 05/2012  Has never had a colonoscopy.  Previous mild IBS, occastional. Now lots of pain with cramping.  Will have brisk gastro colon reflex.  This change occurred in past 2-3 months.  HAs BM 1-5 times, can be very liquidy. Non-bloody, never bleeding.  Very grumbly, loud stomach.  Occurs after about every meal.  Bentyl was tried, with moderate success.  Overall stable weight.  Had mono this past February, march.    Review of systems: Pertinent positive and negative review of systems were noted in the above HPI section. Complete review of systems was performed and was otherwise normal.    Past Medical History  Diagnosis Date  . Diabetes mellitus   . Bipolar disorder   . High cholesterol   . Fatty liver disease, nonalcoholic     Past Surgical History  Procedure Laterality Date  . Laparoscopic cholecystectomy  1993  .  Dilation and curettage of uterus    . Ercp  03/03/2011    Procedure: ENDOSCOPIC RETROGRADE CHOLANGIOPANCREATOGRAPHY (ERCP);  Surgeon: Hart Carwin, MD;  Location: [facility] ENDOSCOPY;  Service: Endoscopy;  Laterality: N/A;    Current Outpatient Prescriptions  Medication Sig Dispense Refill  . aspirin 325 MG tablet Take 325 mg by mouth daily.        . Calcium Carbonate-Vitamin D (OSCAL 500/200 D-3 PO) Take 2 tablets by mouth daily.       . clonazePAM (KLONOPIN) 1 MG tablet Take 4 mg by mouth at bedtime.       . divalproex (DEPAKOTE ER) 500 MG 24 hr tablet Take 1,000 mg by mouth 2 (two) times daily.       Marland Kitchen eletriptan (RELPAX) 40 MG tablet One tablet by mouth at onset of headache. May repeat in 2 hours if headache persists or recurs.  10 tablet  0  . fluticasone (FLONASE) 50 MCG/ACT nasal spray Place 2 sprays into the nose daily.  16 g  12  . gabapentin (NEURONTIN) 600 MG tablet Take 600 mg by mouth at bedtime.        Marland Kitchen glucose blood test strip Use as instructed  100 each  12  . insulin glargine (LANTUS) 100 UNIT/ML injection 50 Units at bedtime. Increase dose by 2 units every morning until fasting glucose is below 140 mg/dl. start at 40 units every evening and increase as instructed      . lamoTRIgine (LAMICTAL) 100 MG tablet Take 100 mg by mouth. One tablet in the morning      .  metFORMIN (GLUCOPHAGE) 1000 MG tablet Take 1 tablet (1,000 mg total) by mouth 2 (two) times daily with a meal.  180 tablet  3  . naproxen (NAPROSYN) 500 MG tablet Take 500 mg by mouth as needed.      . Omega-3 Fatty Acids (FISH OIL PO) Take by mouth as needed.       . OXcarbazepine (TRILEPTAL) 150 MG tablet Take 150 mg by mouth at bedtime.        . pregabalin (LYRICA) 75 MG capsule Take 75 mg by mouth daily.        . promethazine (PHENERGAN) 25 MG tablet Take 1 tablet (25 mg total) by mouth every 6 (six) hours as needed.  30 tablet  1  . topiramate (TOPAMAX) 100 MG tablet Take 100 mg by mouth at bedtime.         No current  facility-administered medications for this visit.    Allergies as of 08/20/2012 - Review Complete 08/20/2012  Allergen Reaction Noted  . Risperidone and related Other (See Comments) 02/22/2011    Family History  Problem Relation Age of Onset  . Mental illness Father     Manic Depression  . Alcohol abuse Father   . Stroke Father   . Heart disease Father   . Arthritis Son   . Depression Son   . Breast cancer Maternal Grandmother   . Breast cancer Paternal Grandmother     History   Social History  . Marital Status: Married    Spouse Name: Daphine Deutscher Mcclenton    Number of Children: 2  . Years of Education: 14   Occupational History  . PROCESSING ASSISTANT    Social History Main Topics  . Smoking status: Never Smoker   . Smokeless tobacco: Never Used  . Alcohol Use: 0.5 oz/week    1 drink(s) per week     Comment: once every couple months  . Drug Use: No  . Sexually Active: Yes -- Female partner(s)    Birth Control/ Protection: Post-menopausal   Other Topics Concern  . Not on file   Social History Narrative   Lives with her husband.  Their daughter, is in college in [place], and plans to transfer to [employer].  Her son is at [employer] and lives with them. Her husband has a child from a previous relationship who lives in [place].       Physical Exam: BP 96/72  Pulse 88  Ht 4' 11.25" (1.505 m)  Wt 197 lb 6 oz (89.529 kg)  BMI 39.53 kg/m2 Constitutional: gObese otherwise well-appearing  Psychiatric: alert and oriented x3 Eyes: extraocular movements intact Mouth: oral pharynx moist, no lesions Neck: supple no lymphadenopathy Cardiovascular: heart regular rate and rhythm Lungs: clear to auscultation bilaterally Abdomen: soft, nontender, nondistended, no obvious ascites, no peritoneal signs, normal bowel sounds Extremities: no lower extremity edema bilaterally Skin: no lesions on visible extremities    Assessment and plan: 52 y.o. female with  chronic diarrhea  Perhaps  this is medication related with her Glucophage. Perhaps she is having bile acid related diarrhea since her gallbladder was removed many years ago. I'm going to try her on cholestyramine powder one dose daily. She will get a basic set of labs including a celiac sprue panel. She has never had a colonoscopy for colon cancer screening or any other reason bowel proceed with that as well.

## 2012-08-21 ENCOUNTER — Encounter: Payer: Self-pay | Admitting: Gastroenterology

## 2012-08-21 LAB — GLIA (IGA/G) + TTG IGA
Gliadin IgA: 2.9 U/mL (ref <20)
Tissue Transglutaminase Ab, IgA: 2.4 U/mL (ref <20)

## 2012-09-18 ENCOUNTER — Emergency Department (HOSPITAL_COMMUNITY)
Admission: EM | Admit: 2012-09-18 | Discharge: 2012-09-18 | Disposition: A | Discharge status: Home / Self Care | Payer: BC Managed Care – PPO | Attending: Emergency Medicine | Admitting: Emergency Medicine

## 2012-09-18 ENCOUNTER — Encounter (HOSPITAL_COMMUNITY): Payer: Self-pay | Admitting: *Deleted

## 2012-09-18 ENCOUNTER — Emergency Department (HOSPITAL_COMMUNITY): Payer: BC Managed Care – PPO

## 2012-09-18 ENCOUNTER — Ambulatory Visit: Payer: BC Managed Care – PPO

## 2012-09-18 ENCOUNTER — Ambulatory Visit (INDEPENDENT_AMBULATORY_CARE_PROVIDER_SITE_OTHER): Payer: BC Managed Care – PPO | Admitting: Internal Medicine

## 2012-09-18 VITALS — BP 128/78 | HR 76 | Temp 97.4°F | Resp 16 | Ht <= 58 in | Wt 196.0 lb

## 2012-09-18 DIAGNOSIS — G43909 Migraine, unspecified, not intractable, without status migrainosus: Secondary | ICD-10-CM

## 2012-09-18 DIAGNOSIS — Z79899 Other long term (current) drug therapy: Secondary | ICD-10-CM | POA: Insufficient documentation

## 2012-09-18 DIAGNOSIS — R42 Dizziness and giddiness: Secondary | ICD-10-CM

## 2012-09-18 DIAGNOSIS — F3289 Other specified depressive episodes: Secondary | ICD-10-CM | POA: Insufficient documentation

## 2012-09-18 DIAGNOSIS — R51 Headache: Secondary | ICD-10-CM | POA: Insufficient documentation

## 2012-09-18 DIAGNOSIS — R12 Heartburn: Secondary | ICD-10-CM | POA: Insufficient documentation

## 2012-09-18 DIAGNOSIS — R110 Nausea: Secondary | ICD-10-CM

## 2012-09-18 DIAGNOSIS — Z7982 Long term (current) use of aspirin: Secondary | ICD-10-CM | POA: Insufficient documentation

## 2012-09-18 DIAGNOSIS — F411 Generalized anxiety disorder: Secondary | ICD-10-CM | POA: Insufficient documentation

## 2012-09-18 DIAGNOSIS — E119 Type 2 diabetes mellitus without complications: Secondary | ICD-10-CM

## 2012-09-18 DIAGNOSIS — R5383 Other fatigue: Secondary | ICD-10-CM | POA: Insufficient documentation

## 2012-09-18 DIAGNOSIS — Z794 Long term (current) use of insulin: Secondary | ICD-10-CM | POA: Insufficient documentation

## 2012-09-18 DIAGNOSIS — E7800 Pure hypercholesterolemia, unspecified: Secondary | ICD-10-CM | POA: Insufficient documentation

## 2012-09-18 DIAGNOSIS — Z8719 Personal history of other diseases of the digestive system: Secondary | ICD-10-CM | POA: Insufficient documentation

## 2012-09-18 DIAGNOSIS — K5900 Constipation, unspecified: Secondary | ICD-10-CM | POA: Insufficient documentation

## 2012-09-18 DIAGNOSIS — R109 Unspecified abdominal pain: Secondary | ICD-10-CM

## 2012-09-18 DIAGNOSIS — R079 Chest pain, unspecified: Secondary | ICD-10-CM

## 2012-09-18 DIAGNOSIS — I10 Essential (primary) hypertension: Secondary | ICD-10-CM | POA: Insufficient documentation

## 2012-09-18 DIAGNOSIS — R5381 Other malaise: Secondary | ICD-10-CM | POA: Insufficient documentation

## 2012-09-18 DIAGNOSIS — F329 Major depressive disorder, single episode, unspecified: Secondary | ICD-10-CM | POA: Insufficient documentation

## 2012-09-18 LAB — COMPREHENSIVE METABOLIC PANEL
Albumin: 4.4 g/dL (ref 3.5–5.2)
Albumin: 3.4 g/dL — ABNORMAL LOW (ref 3.5–5.2)
BUN: 13 mg/dL (ref 6–23)
CO2: 26 mEq/L (ref 19–32)
Calcium: 9.7 mg/dL (ref 8.4–10.5)
Chloride: 103 mEq/L (ref 96–112)
Creatinine, Ser: 0.86 mg/dL (ref 0.50–1.10)
Glucose, Bld: 125 mg/dL — ABNORMAL HIGH (ref 70–99)
Potassium: 4.3 mEq/L (ref 3.5–5.1)
Sodium: 139 mEq/L (ref 135–145)
Total Bilirubin: 0.3 mg/dL (ref 0.3–1.2)
Total Protein: 7.4 g/dL (ref 6.0–8.3)
Total Protein: 7.2 g/dL (ref 6.0–8.3)

## 2012-09-18 LAB — POCT UA - MICROSCOPIC ONLY
Casts, Ur, LPF, POC: NEGATIVE
Crystals, Ur, HPF, POC: NEGATIVE

## 2012-09-18 LAB — CBC WITH DIFFERENTIAL/PLATELET
Basophils Relative: 1 % (ref 0–1)
Eosinophils Absolute: 0.1 10*3/uL (ref 0.0–0.7)
Hemoglobin: 13.5 g/dL (ref 12.0–15.0)
MCH: 30 pg (ref 26.0–34.0)
MCHC: 34.4 g/dL (ref 30.0–36.0)
Monocytes Absolute: 0.5 10*3/uL (ref 0.1–1.0)
Monocytes Relative: 5 % (ref 3–12)
Neutrophils Relative %: 50 % (ref 43–77)

## 2012-09-18 LAB — TROPONIN I: Troponin I: <0.3 ng/mL (ref <0.30)

## 2012-09-18 LAB — URINALYSIS, ROUTINE W REFLEX MICROSCOPIC
Glucose, UA: NEGATIVE mg/dL
Leukocytes, UA: NEGATIVE
Nitrite: NEGATIVE
pH: 6 (ref 5.0–8.0)

## 2012-09-18 LAB — POCT URINALYSIS DIPSTICK
Blood, UA: NEGATIVE
Protein, UA: NEGATIVE
Spec Grav, UA: 1.025
Urobilinogen, UA: 1

## 2012-09-18 LAB — LIPASE, BLOOD: Lipase: 31 U/L (ref 11–59)

## 2012-09-18 LAB — LIPID PANEL
Cholesterol: 224 mg/dL — ABNORMAL HIGH (ref 0–200)
Triglycerides: 277 mg/dL — ABNORMAL HIGH (ref <150)

## 2012-09-18 MED ORDER — GI COCKTAIL ~~LOC~~: 30.0000 mL | Freq: Once | ORAL | Status: DC

## 2012-09-18 MED ORDER — INSULIN GLARGINE 100 UNIT/ML ~~LOC~~ SOLN: 50.0000 [IU] | Freq: Every day | SUBCUTANEOUS | Status: DC

## 2012-09-18 MED ORDER — IOHEXOL 300 MG/ML  SOLN
100.0000 mL | Freq: Once | INTRAMUSCULAR | Status: AC | PRN
  Administered 2012-09-18: 100 mL via INTRAVENOUS

## 2012-09-18 MED ORDER — ELETRIPTAN HYDROBROMIDE 40 MG PO TABS: 40.0000 mg | ORAL_TABLET | ORAL | Status: DC | PRN

## 2012-09-18 MED ORDER — POLYETHYLENE GLYCOL 3350 17 G PO PACK: 17.0000 g | PACK | Freq: Every day | ORAL | Status: DC

## 2012-09-18 MED ORDER — IOHEXOL 300 MG/ML  SOLN
25.0000 mL | INTRAMUSCULAR | Status: AC
  Administered 2012-09-18: 25 mL via ORAL

## 2012-09-18 NOTE — Progress Notes (Signed)
Subjective:    Patient ID: Michelle Torres, female    DOB: 1960/05/10, 52 y.o.   MRN: [id number]  HPI 52 y.o. Female presents to clinic today due to dizziness and shakiness.  Patient has a headache developing and feels like it will continue to a migraine. Patient had a migraine last Tuesday (7/22) until Thursday. The migraines have been debilitating enough she has had to stay out of work. She was able to return to work on Friday (7/25) but states that by the end of the day she was completely wiped out. She was feeling better through the weekend but Tuesday (7/28) she got another migraine that lasted until Wednesday. She has had several episodes of vomiting with each migraine, nausea, sensitivity to light, and fatigue.Her bp this morning was low at home and her blood sugar was approximately 175 this morning around 6:30 am. She has been monitoring her blood sugar and it has been elevated in the 150-170's. When she has a migraine she has not been able to eat but she has tried to stay hydrated. She has been taking 3 Excedrin and using cold compresses and trying to rest in dark room until the migraines dissipate. They have been lasting up to 8 hours. She gets occasional migraines but it is worrying her that she has had several back to back.    Review of Systems  Constitutional: Positive for fever, chills, activity change, appetite change and fatigue.  HENT: Positive for sinus pressure. Negative for ear pain, sore throat, neck pain and neck stiffness.   Eyes: Positive for photophobia and redness. Negative for visual disturbance.  Respiratory: Negative for chest tightness and shortness of breath.   Cardiovascular: Negative for chest pain and leg swelling.  Gastrointestinal: Positive for nausea and vomiting. Negative for abdominal pain, diarrhea and constipation.  Genitourinary: Negative for difficulty urinating.  Musculoskeletal: Negative for myalgias.  Skin: Positive for pallor. Negative for rash.   Neurological: Positive for dizziness, light-headedness and headaches.  All other systems reviewed and are negative.       Objective:   Physical Exam  Nursing note and vitals reviewed. Constitutional: She is oriented to person, place, and time. Vital signs are normal. She appears well-developed and well-nourished. No distress.  HENT:  Head: Normocephalic and atraumatic.  Right Ear: External ear normal.  Left Ear: External ear normal.  Nose: Nose normal.  Mouth/Throat: Uvula is midline, oropharynx is clear and moist and mucous membranes are normal.  Eyes: Conjunctivae, EOM and lids are normal. Pupils are equal, round, and reactive to light.  Neck: Trachea normal and normal range of motion. Neck supple.  Cardiovascular: Normal rate, regular rhythm, normal heart sounds, intact distal pulses and normal pulses.   Pulmonary/Chest: Effort normal and breath sounds normal.  Abdominal: Soft. Bowel sounds are normal.  Musculoskeletal: Normal range of motion.  Neurological: She is alert and oriented to person, place, and time. She has normal strength and normal reflexes. No cranial nerve deficit or sensory deficit.  Skin: Skin is warm and dry. No rash noted. She is not diaphoretic.  Psychiatric: She has a normal mood and affect. Her speech is normal and behavior is normal. Judgment and thought content normal. Cognition and memory are normal.    While waiting on lab results patient developed sharp chest pain. On re-examination patient had reproducible chest pain with palpation of sternum.   Patient was given a GI cocktail which did not resolve chest pain. IV fluids were started and patient placed on cardiac  monitor prior to transfer to ED to r/o MI.  Results for orders placed in visit on 09/18/12  GLUCOSE, POCT (MANUAL RESULT ENTRY)      Result Value Range   POC Glucose 113 (*) 70 - 99 mg/dl  POCT GLYCOSYLATED HEMOGLOBIN (HGB A1C)      Result Value Range   Hemoglobin A1C 6.7    POCT UA -  MICROSCOPIC ONLY      Result Value Range   WBC, Ur, HPF, POC 0-2     RBC, urine, microscopic 0-1     Bacteria, U Microscopic TRACE     Mucus, UA MOD     Epithelial cells, urine per micros 0-3     Crystals, Ur, HPF, POC NEG     Casts, Ur, LPF, POC NEG     Yeast, UA NEG    POCT URINALYSIS DIPSTICK      Result Value Range   Color, UA YELLOW     Clarity, UA CLEAR     Glucose, UA NEG     Bilirubin, UA SMALL     Ketones, UA 15     Spec Grav, UA 1.025     Blood, UA NEG     pH, UA 6.0     Protein, UA NEG     Urobilinogen, UA 1.0     Nitrite, UA NEG     Leukocytes, UA Negative     EKG normal sinus rhythm no sings of ischemia CXR primary read by Dr. Perrin Maltese. No signs of infiltrates, cardiomegaly, or other acute process.     Assessment & Plan:  Lightheadedness - Plan: POCT UA - Microscopic Only, POCT urinalysis dipstick, EKG 12-Lead  DM type 2 (diabetes mellitus, type 2) - Plan: POCT glucose (manual entry), POCT glycosylated hemoglobin (Hb A1C), Comprehensive metabolic panel, Lipid panel  Nausea alone - Plan: gi cocktail (Maalox,Lidocaine,Donnatal). GI cocktail did not help to relieve patients chest pain.  Migraine-increased frequency if continues we will refer her to Neurology  Chest pain - Plan: EKG 12-Lead, gi cocktail (Maalox,Lidocaine,Donnatal), DG Chest 2 View.Do not think pts chest pain is cardiac related but transporting patient to ED to rule out MI due to patients risk factors.

## 2012-09-18 NOTE — ED Provider Notes (Signed)
Medical screening examination/treatment/procedure(s) were conducted as a shared visit with non-physician practitioner(s) and myself.  I personally evaluated the patient during the encounter  From urgent care with epigastric pain and lower chest pain x 2 hours.  No fever, vomiting, urinary symptoms. Hx DM and HLD but no cardiac history.  EKG nonischemic.  Lower sternal tenderness and epigastric pain on exam. Atypical for ACS.  Gallbladder out.  Glynn Octave, MD 09/18/12 [phone]

## 2012-09-18 NOTE — Progress Notes (Signed)
I have examined this patient along with the student and agree.  

## 2012-09-18 NOTE — ED Provider Notes (Signed)
CSN: [id number]     Arrival date & time 09/18/12  1139 History     First MD Initiated Contact with Patient 09/18/12 1201     Chief Complaint  Patient presents with  . Chest Pain  . Abdominal Pain   (Consider location/radiation/quality/duration/timing/severity/associated sxs/prior Treatment) Patient is a 52 y.o. female presenting with chest pain and abdominal pain.  Chest Pain Associated symptoms: abdominal pain, fatigue (x 3 days), headache (3 days, Hx Migraines) and heartburn (Hx GERD )   Associated symptoms: no cough, no fever, no nausea, no numbness, no palpitations, no shortness of breath and not vomiting   Abdominal pain:    Location:  Generalized (increased in epigastric and bilateral upper abdomen)   Quality:  Dull and aching   Onset quality:  Gradual (over 30 minute period)   Duration:  4 hours   Timing:  Constant   Chronicity:  New Abdominal Pain Associated symptoms include abdominal pain, chest pain, fatigue (x 3 days) and headaches (3 days, Hx Migraines). Pertinent negatives include no chills, congestion, coughing, fever, nausea, numbness or vomiting.   52 yo obese F presents to [facility]  from Urgent Care for chest and abdominal pain. C/o constant infrasternal ache which began at 930 am, spread to bilateral upper abdomen over the next 30 minutes, while waiting for test results at Urgent Care. Was given ASA and NG prior to arrival, but pain remainsmoderate.  Pain with deep inhalation. Denies any fever, recent infections, n/v, constipation, or urinary symptoms. Hx of chronic diarrhea, newly controlled with Cholestyramine.  S/p cholecysectomy. Hx Mononucleosis (Feb 2014), GERD, Anxiety, Bipolar Disorder. Colonoscopy scheduled for this year.   Past Medical History  Diagnosis Date  . Diabetes mellitus   . Bipolar disorder   . High cholesterol   . Fatty liver disease, nonalcoholic   . Depression   . GERD (gastroesophageal reflux disease)   . Anxiety   . Hypertension    Past  Surgical History  Procedure Laterality Date  . Laparoscopic cholecystectomy  1993  . Dilation and curettage of uterus    . Ercp  03/03/2011    Procedure: ENDOSCOPIC RETROGRADE CHOLANGIOPANCREATOGRAPHY (ERCP);  Surgeon: Hart Carwin, MD;  Location: [facility] ENDOSCOPY;  Service: Endoscopy;  Laterality: N/A;   Family History  Problem Relation Age of Onset  . Mental illness Father     Manic Depression  . Alcohol abuse Father   . Stroke Father   . Heart disease Father   . Arthritis Son   . Depression Son   . Breast cancer Maternal Grandmother   . Breast cancer Paternal Grandmother    History  Substance Use Topics  . Smoking status: Never Smoker   . Smokeless tobacco: Never Used  . Alcohol Use: 0.5 oz/week    1 drink(s) per week     Comment: once every couple months   OB History   Grav Para Term Preterm Abortions TAB SAB Ect Mult Living                 Review of Systems  Constitutional: Positive for fatigue (x 3 days). Negative for fever and chills.  HENT: Negative for congestion.   Respiratory: Negative for cough and shortness of breath.   Cardiovascular: Positive for chest pain. Negative for palpitations.  Gastrointestinal: Positive for heartburn (Hx GERD ) and abdominal pain. Negative for nausea and vomiting.  Endocrine: Negative for cold intolerance and heat intolerance.  Genitourinary: Negative for dysuria, urgency, frequency, enuresis and difficulty urinating.  Neurological: Positive  for headaches (3 days, Hx Migraines). Negative for numbness.    Allergies  Risperidone and related  Home Medications   Current Outpatient Rx  Name  Route  Sig  Dispense  Refill  . aspirin 325 MG tablet   Oral   Take 325 mg by mouth daily.           . Calcium Carbonate-Vitamin D (OSCAL 500/200 D-3 PO)   Oral   Take 2 tablets by mouth daily.          . cholestyramine (QUESTRAN) 4 GM/DOSE powder   Oral   Take 1 packet (4 g total) by mouth daily.   378 g   12   . clonazePAM  (KLONOPIN) 1 MG tablet   Oral   Take 4 mg by mouth at bedtime.          . divalproex (DEPAKOTE ER) 500 MG 24 hr tablet   Oral   Take 1,000 mg by mouth 2 (two) times daily.          Marland Kitchen eletriptan (RELPAX) 40 MG tablet   Oral   Take 1 tablet (40 mg total) by mouth as needed.   10 tablet   0   . fluticasone (FLONASE) 50 MCG/ACT nasal spray   Nasal   Place 2 sprays into the nose as needed for rhinitis.         Marland Kitchen gabapentin (NEURONTIN) 600 MG tablet   Oral   Take 600 mg by mouth at bedtime.           . lamoTRIgine (LAMICTAL) 100 MG tablet   Oral   Take 100 mg by mouth every morning.          . meloxicam (MOBIC) 15 MG tablet   Oral   Take 15 mg by mouth every morning.          . metFORMIN (GLUCOPHAGE) 1000 MG tablet   Oral   Take 1 tablet (1,000 mg total) by mouth 2 (two) times daily with a meal.   180 tablet   3   . naproxen (NAPROSYN) 500 MG tablet   Oral   Take 500 mg by mouth 2 (two) times daily as needed (for pain).          . Omega-3 Fatty Acids (FISH OIL PO)   Oral   Take 1 capsule by mouth daily as needed (takes on an occasional basis.).          Marland Kitchen OXcarbazepine (TRILEPTAL) 150 MG tablet   Oral   Take 150 mg by mouth at bedtime.           . pregabalin (LYRICA) 75 MG capsule   Oral   Take 75 mg by mouth every morning.          . promethazine (PHENERGAN) 25 MG tablet   Oral   Take 1 tablet (25 mg total) by mouth every 6 (six) hours as needed.   30 tablet   1   . topiramate (TOPAMAX) 100 MG tablet   Oral   Take 100 mg by mouth at bedtime.           . insulin glargine (LANTUS) 100 UNIT/ML injection   Subcutaneous   Inject 0.5 mLs (50 Units total) into the skin at bedtime. Increase dose by 2 units every morning until fasting glucose is below 140 mg/dl. start at 40 units every evening and increase as instructed   10 mL   5    BP 103/65  Pulse 78  Temp(Src) 97.7 F (36.5 C) (Oral)  Resp 20  SpO2 98% Physical Exam   Constitutional: She is oriented to person, place, and time. She appears well-developed and well-nourished. No distress.  HENT:  Head: Normocephalic and atraumatic.  Eyes: Right eye exhibits no discharge. Left eye exhibits no discharge.  Cardiovascular: Normal rate, regular rhythm, normal heart sounds and intact distal pulses.  Exam reveals no gallop and no friction rub.   No murmur heard. Pulmonary/Chest: Effort normal. No respiratory distress. She has no wheezes. She has no rales. She exhibits no tenderness.  Abdominal: Soft. Bowel sounds are normal. She exhibits no distension and no mass. There is no hepatosplenomegaly, splenomegaly or hepatomegaly. There is tenderness (generalized, more prominent in epigastric and bilateral upper quadrants) in the right upper quadrant, epigastric area and left upper quadrant.  Neurological: She is alert and oriented to person, place, and time.  Skin: Skin is warm and dry. She is not diaphoretic.    ED Course   Procedures (including critical care time)  Labs Reviewed  CBC WITH DIFFERENTIAL - Abnormal; Notable for the following:    Lymphs Abs 4.5 (*)    All other components within normal limits  COMPREHENSIVE METABOLIC PANEL - Abnormal; Notable for the following:    Albumin 3.4 (*)    AST 62 (*)    ALT 83 (*)    Alkaline Phosphatase 119 (*)    Total Bilirubin 0.2 (*)    GFR calc non Af Amer 77 (*)    GFR calc Af Amer 90 (*)    All other components within normal limits  LIPASE, BLOOD  TROPONIN I  URINALYSIS, ROUTINE W REFLEX MICROSCOPIC   Dg Chest 2 View  09/18/2012   *RADIOLOGY REPORT*  Clinical Data: Chest pain.  CHEST - 2 VIEW  Comparison: No priors.  Findings: Lung volumes are normal.  No consolidative airspace disease.  No pleural effusions.  No pneumothorax.  No pulmonary nodule or mass noted.  Pulmonary vasculature and the cardiomediastinal silhouette are within normal limits.  IMPRESSION: 1. No radiographic evidence of acute  cardiopulmonary disease.  Clinically significant discrepancy from primary report, if provided: None   Original Report Authenticated By: Trudie Reed, M.D.   1. Constipation   2. Abdominal pain    . Date: 09/18/2012  Rate: 71  Rhythm: normal sinus rhythm  QRS Axis: Borderline LAD  Intervals: normal  ST/T Wave abnormalities: normal  Conduction Disutrbances:none  Narrative Interpretation:   Old EKG Reviewed: unchanged   MDM   Patient with vague  Chest/ upperaabdominal complaints. Doubt ACS.Pan positive review of sxs with no focal abdominal tenderness      4:16 PM Patient with transamintis. Her AA series shows heavy stool burden. CT pending. i have given report to Dr. Ethelda Chick who will assume care of the patient. Plan: if negative. D/c cholestyramine, colace 100-300 mg daily. F/u with pcp.  Arthor Captain, PA-C 09/18/12 1704

## 2012-09-18 NOTE — ED Notes (Signed)
Patient states she was being seen at [facility] for migraine headache when she started having chest pain and upper abdominal pain.  Patient given GI cocktail with no relief.  Patient transported here per [facility] and received nitro x 1 enroute and ASA 324mg .  Patient states pain still at 7/10.

## 2012-09-19 ENCOUNTER — Telehealth: Payer: Self-pay

## 2012-09-19 NOTE — Telephone Encounter (Signed)
Pt states that she was in yesterday and Chelle forgot to give her an out of work note for Tuesday July 29th - Thursday July 31st. Best# [phone]

## 2012-09-19 NOTE — Telephone Encounter (Signed)
Patient advised and it is at front desk at front desk.

## 2012-09-19 NOTE — Telephone Encounter (Signed)
Have printed note, left message for patient to call back,so I can advise.

## 2012-10-02 ENCOUNTER — Ambulatory Visit (INDEPENDENT_AMBULATORY_CARE_PROVIDER_SITE_OTHER): Payer: BC Managed Care – PPO | Admitting: Physician Assistant

## 2012-10-02 VITALS — BP 94/68 | HR 67 | Temp 98.0°F | Resp 20 | Ht 60.5 in | Wt 197.4 lb

## 2012-10-02 DIAGNOSIS — F3189 Other bipolar disorder: Secondary | ICD-10-CM

## 2012-10-02 DIAGNOSIS — F411 Generalized anxiety disorder: Secondary | ICD-10-CM

## 2012-10-02 DIAGNOSIS — F3181 Bipolar II disorder: Secondary | ICD-10-CM

## 2012-10-02 DIAGNOSIS — G4700 Insomnia, unspecified: Secondary | ICD-10-CM

## 2012-10-02 MED ORDER — HYDROXYZINE HCL 25 MG PO TABS: 25.0000 mg | ORAL_TABLET | Freq: Three times a day (TID) | ORAL | Status: DC | PRN
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
## 2012-10-02 NOTE — Patient Instructions (Signed)
Continue to try to reach your psychiatrist, who may suggest and adjustment in your regular medications. If you feel unsafe, go to [facility] for an intake assessment or the Emergency Department at [facility]. I encourage you to schedule with a psychologist.  [facility] [phone] (you have to schedule, but say that I recommended them to you. Most likely, you'll have to leave a message and wait for a call back.  If you haven't heard back in 48 hours, call again.) Raechel Ache

## 2012-10-10 ENCOUNTER — Ambulatory Visit (AMBULATORY_SURGERY_CENTER): Payer: BC Managed Care – PPO | Admitting: Gastroenterology

## 2012-10-10 ENCOUNTER — Encounter: Payer: Self-pay | Admitting: Gastroenterology

## 2012-10-10 VITALS — BP 98/56 | HR 76 | Temp 97.0°F | Resp 17 | Ht 59.0 in | Wt 197.0 lb

## 2012-10-10 DIAGNOSIS — R197 Diarrhea, unspecified: Secondary | ICD-10-CM

## 2012-10-10 LAB — GLUCOSE, CAPILLARY: Glucose-Capillary: 182 mg/dL — ABNORMAL HIGH (ref 70–99)

## 2012-10-10 MED ORDER — SODIUM CHLORIDE 0.9 % IV SOLN: 500.0000 mL | INTRAVENOUS | Status: DC

## 2012-10-10 NOTE — Progress Notes (Signed)
Patient did not have preoperative order for IV antibiotic SSI prophylaxis. ([id number])   

## 2012-10-10 NOTE — Patient Instructions (Addendum)
YOU HAD AN ENDOSCOPIC PROCEDURE TODAY AT THE [facility]: Refer to the procedure report that was given to you for any specific questions about what was found during the examination.  If the procedure report does not answer your questions, please call your gastroenterologist to clarify.  If you requested that your care partner not be given the details of your procedure findings, then the procedure report has been included in a sealed envelope for you to review at your convenience later.  YOU SHOULD EXPECT: Some feelings of bloating in the abdomen. Passage of more gas than usual.  Walking can help get rid of the air that was put into your GI tract during the procedure and reduce the bloating. If you had a lower endoscopy (such as a colonoscopy or flexible sigmoidoscopy) you may notice spotting of blood in your stool or on the toilet paper. If you underwent a bowel prep for your procedure, then you may not have a normal bowel movement for a few days.  DIET: Your first meal following the procedure should be a light meal and then it is ok to progress to your normal diet.  A half-sandwich or bowl of soup is an example of a good first meal.  Heavy or fried foods are harder to digest and may make you feel nauseous or bloated.  Likewise meals heavy in dairy and vegetables can cause extra gas to form and this can also increase the bloating.  Drink plenty of fluids but you should avoid alcoholic beverages for 24 hours. Increase the fiber in your diet.  ACTIVITY: Your care partner should take you home directly after the procedure.  You should plan to take it easy, moving slowly for the rest of the day.  You can resume normal activity the day after the procedure however you should NOT DRIVE or use heavy machinery for 24 hours (because of the sedation medicines used during the test).    SYMPTOMS TO REPORT IMMEDIATELY: A gastroenterologist can be reached at any hour.  During normal business hours, 8:30 AM to  5:00 PM Monday through Friday, call [phone].  After hours and on weekends, please call the GI answering service at [phone] who will take a message and have the physician on call contact you.   Following lower endoscopy (colonoscopy or flexible sigmoidoscopy):  Excessive amounts of blood in the stool  Significant tenderness or worsening of abdominal pains  Swelling of the abdomen that is new, acute  Fever of 100F or higher  FOLLOW UP: If any biopsies were taken you will be contacted by phone or by letter within the next 1-3 weeks.  Call your gastroenterologist if you have not heard about the biopsies in 3 weeks.  Our staff will call the home number listed on your records the next business day following your procedure to check on you and address any questions or concerns that you may have at that time regarding the information given to you following your procedure. This is a courtesy call and so if there is no answer at the home number and we have not heard from you through the emergency physician on call, we will assume that you have returned to your regular daily activities without incident.  SIGNATURES/CONFIDENTIALITY: You and/or your care partner have signed paperwork which will be entered into your electronic medical record.  These signatures attest to the fact that that the information above on your After Visit Summary has been reviewed and is understood.  Full   responsibility of the confidentiality of this discharge information lies with you and/or your care-partner. 

## 2012-10-10 NOTE — Op Note (Signed)
[facility] [address] [place], [address]   COLONOSCOPY PROCEDURE REPORT  PATIENT: Michelle Torres, Michelle Torres  MR#: [id number] BIRTHDATE: 1960/06/29 , 52  yrs. old GENDER: Female ENDOSCOPIST: Rachael Fee, MD REFERRED GE:[id number] Cleta Alberts, M.D. PROCEDURE DATE:  10/10/2012 PROCEDURE:   Colonoscopy, screening First Screening Colonoscopy - Avg.  risk and is 50 yrs.  old or older Yes.  Prior Negative Screening - Now for repeat screening. N/A  History of Adenoma - Now for follow-up colonoscopy & has been > or = to 3 yrs.  N/A  Polyps Removed Today? No.  Recommend repeat exam, <10 yrs? No. ASA CLASS:   Class II INDICATIONS:average risk screening. MEDICATIONS: MAC sedation, administered by CRNA and propofol (Diprivan) 250mg  IV  DESCRIPTION OF PROCEDURE:   After the risks benefits and alternatives of the procedure were thoroughly explained, informed consent was obtained.  A digital rectal exam revealed no abnormalities of the rectum.   The LB [id number]  endoscope was introduced through the anus and advanced to the cecum, which was identified by both the appendix and ileocecal valve. No adverse events experienced.   The quality of the prep was good.  The instrument was then slowly withdrawn as the colon was fully examined.  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 The examination was otherwise normal.     The time to cecum=3 minutes 44 seconds.  Withdrawal time=9 minutes 01 seconds.  The scope was withdrawn and the procedure completed. COMPLICATIONS: There were no complications.  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 The examination was otherwise normal.  No polyps or cancers  RECOMMENDATIONS: You should continue to follow colorectal cancer screening guidelines for "routine risk" patients with a repeat colonoscopy in 10 years. There is no need for FOBT (stool) testing for at least 5 years. Continue  taking once daily cholestryamine, it seems to be helping your loose stools that started after gallbladder surgery.   eSigned:  Rachael Fee, MD 10/10/2012 11:19 AM

## 2012-10-10 NOTE — Progress Notes (Signed)
Report to pacu rn, vss, bbs=clear 

## 2012-10-13 ENCOUNTER — Telehealth: Payer: Self-pay | Admitting: *Deleted

## 2012-10-13 NOTE — Telephone Encounter (Signed)
Message left

## 2012-10-25 ENCOUNTER — Ambulatory Visit (INDEPENDENT_AMBULATORY_CARE_PROVIDER_SITE_OTHER): Payer: BC Managed Care – PPO | Admitting: Physician Assistant

## 2012-10-25 VITALS — BP 118/66 | HR 74 | Temp 99.3°F | Resp 18 | Ht 60.5 in | Wt 196.6 lb

## 2012-10-25 DIAGNOSIS — J069 Acute upper respiratory infection, unspecified: Secondary | ICD-10-CM

## 2012-10-25 DIAGNOSIS — Z79899 Other long term (current) drug therapy: Secondary | ICD-10-CM

## 2012-10-25 DIAGNOSIS — F3189 Other bipolar disorder: Secondary | ICD-10-CM

## 2012-10-25 DIAGNOSIS — F3181 Bipolar II disorder: Secondary | ICD-10-CM

## 2012-10-25 DIAGNOSIS — H6591 Unspecified nonsuppurative otitis media, right ear: Secondary | ICD-10-CM

## 2012-10-25 DIAGNOSIS — H6590 Unspecified nonsuppurative otitis media, unspecified ear: Secondary | ICD-10-CM

## 2012-10-25 LAB — POCT CBC
Granulocyte percent: 50 %G (ref 37–80)
HCT, POC: 41.7 % (ref 37.7–47.9)
MCV: 91.9 fL (ref 80–97)
MID (cbc): 0.8 (ref 0–0.9)
POC Granulocyte: 4.7 (ref 2–6.9)
Platelet Count, POC: 317 10*3/uL (ref 142–424)
RBC: 4.54 M/uL (ref 4.04–5.48)

## 2012-10-25 LAB — HEPATIC FUNCTION PANEL
ALT: 51 U/L — ABNORMAL HIGH (ref 0–35)
Bilirubin, Direct: <0.1 mg/dL (ref 0.0–0.3)
Total Bilirubin: 0.3 mg/dL (ref 0.3–1.2)

## 2012-10-25 LAB — VALPROIC ACID LEVEL: Valproic Acid Lvl: 67.3 ug/mL (ref 50.0–100.0)

## 2012-10-25 MED ORDER — IPRATROPIUM BROMIDE 0.03 % NA SOLN: 2.0000 | Freq: Two times a day (BID) | NASAL | Status: DC

## 2012-10-25 NOTE — Patient Instructions (Signed)
Get plenty of rest and drink at least 64 ounces of water daily. 

## 2012-10-25 NOTE — Progress Notes (Signed)
  Subjective:    Patient ID: Michelle Torres, female    DOB: May 15, 1960, 52 y.o.   MRN: [id number]  HPI  This 52 y.o. female presents for labwork requested by her psychiatrist for drug monitoring (she takes Depakote ER for treatment of Bipolar disorder), as well as evaluation of a sinus problem.  She had a cold last week, with sinus pressure and drainage and cough.  No fever or chills, but she was feeling "awful." She's much better now, except for some pressure in her ears.  Her husband now has severe congestion and cough.  Medications, allergies, past medical history, surgical history, family history, social history and problem list reviewed.  Review of Systems As above.    Objective:   Physical Exam Blood pressure 118/66, pulse 74, temperature 99.3 F (37.4 C), temperature source Oral, resp. rate 18, height 5' 0.5" (1.537 m), weight 196 lb 9.6 oz (89.177 kg), SpO2 98.00%. Body mass index is 37.75 kg/(m^2). Well-developed, well nourished WF who is awake, alert and oriented, in NAD. HEENT: [place]/AT, PERRL, EOMI.  Sclera and conjunctiva are clear.  EAC are patent, LEFT TM is normal in appearance. RIGHT TM is retracted and dull. Nasal mucosa is pink and moist. OP is clear. Neck: supple, non-tender, no lymphadenopathy, thyromegaly. Heart: RRR, no murmur Lungs: normal effort, CTA Extremities: no cyanosis, clubbing or edema. Skin: warm and dry without rash. Psychologic: good mood and appropriate affect, normal speech and behavior.    Results for orders placed in visit on 10/25/12  POCT CBC      Result Value Range   WBC 9.3  4.6 - 10.2 K/uL   Lymph, poc 3.8 (*) 0.6 - 3.4   POC LYMPH PERCENT 41.0  10 - 50 %L   MID (cbc) 0.8  0 - 0.9   POC MID % 9.0  0 - 12 %M   POC Granulocyte 4.7  2 - 6.9   Granulocyte percent 50.0  37 - 80 %G   RBC 4.54  4.04 - 5.48 M/uL   Hemoglobin 13.3  12.2 - 16.2 g/dL   HCT, POC 40.9  81.1 - 47.9 %   MCV 91.9  80 - 97 fL   MCH, POC 29.3  27 - 31.2 pg   MCHC 31.9   31.8 - 35.4 g/dL   RDW, POC 91.4     Platelet Count, POC 317  142 - 424 K/uL   MPV 8.2  0 - 99.8 fL       Assessment & Plan:  Viral URI -resolving  Otitis media with effusion, right - Plan: ipratropium (ATROVENT) 0.03 % nasal spray  Encounter for long-term (current) use of other medications - Plan: Valproic acid level, POCT CBC, Hepatic function panel  Follow-up with me in 1 month for DM, sooner if needed.  Fernande Bras, PA-C Physician Assistant-Certified [facility]

## 2012-10-28 ENCOUNTER — Other Ambulatory Visit: Payer: Self-pay | Admitting: Physician Assistant

## 2012-11-28 ENCOUNTER — Ambulatory Visit (INDEPENDENT_AMBULATORY_CARE_PROVIDER_SITE_OTHER): Payer: BC Managed Care – PPO | Admitting: Physician Assistant

## 2012-11-28 VITALS — BP 102/58 | HR 78 | Temp 97.9°F | Resp 16 | Ht 60.0 in | Wt 195.8 lb

## 2012-11-28 DIAGNOSIS — G43909 Migraine, unspecified, not intractable, without status migrainosus: Secondary | ICD-10-CM

## 2012-11-28 DIAGNOSIS — R112 Nausea with vomiting, unspecified: Secondary | ICD-10-CM

## 2012-11-28 DIAGNOSIS — E119 Type 2 diabetes mellitus without complications: Secondary | ICD-10-CM

## 2012-11-28 DIAGNOSIS — R197 Diarrhea, unspecified: Secondary | ICD-10-CM

## 2012-11-28 LAB — POCT GLYCOSYLATED HEMOGLOBIN (HGB A1C): Hemoglobin A1C: 8.6

## 2012-11-28 LAB — GLUCOSE, POCT (MANUAL RESULT ENTRY): POC Glucose: 190 mg/dl — AB (ref 70–99)

## 2012-11-28 MED ORDER — ELETRIPTAN HYDROBROMIDE 40 MG PO TABS: 40.0000 mg | ORAL_TABLET | ORAL | Status: DC | PRN

## 2012-11-28 MED ORDER — PROMETHAZINE HCL 25 MG/ML IJ SOLN
25.0000 mg | Freq: Once | INTRAMUSCULAR | Status: AC
  Administered 2012-11-28: 25 mg via INTRAMUSCULAR

## 2012-11-28 MED ORDER — DICYCLOMINE HCL 10 MG PO CAPS: 10.0000 mg | ORAL_CAPSULE | Freq: Three times a day (TID) | ORAL | Status: DC

## 2012-11-28 NOTE — Patient Instructions (Signed)
Get plenty of rest and drink at least 64 ounces of water daily. Take Relpax as soon as you get home. Repeat the dose in 2 hours if any headache persists.  Get a flu vaccine once you are well.

## 2012-11-28 NOTE — Progress Notes (Signed)
Subjective:    Patient ID: Michelle Torres, female    DOB: [birth year], 52 y.o.   MRN: [id number]  HPI  Michelle Torres is a 52 year old female with a history of Bipolar 2 Disorder, DM, and migraines who presents with migraine and diarrhea. She had a migraine on Wednesday (11/26/2012) and was unable to work. She took a Relpax which helped some. She had photophobia, nausea, vomiting, and some dizziness which is common for her migraines. On Thursday (yesterday), she was still recovering from her migraine but after dinner, she had 7 episodes of diarrhea. There was abdominal pain and cramping before the diarrhea started. THe diarrhea has been brown and watery. No fevers, chills, or myalgias. She took cholestyramine which helped stopped the diarrhea and she has had no more episodes this morning. She is still feeling nauseated. Her migraine has started to come back on the way to the office this morning so she has not taken her Relpax today yet. She is drinking fluids to stay hydrated but has not eaten anything since last night. She has not traveled outside of the country in the past 3 weeks. No other sick contacts or people at home with similar symptoms.   Her psychiatrist increased her Lamictal dose a week ago. She denies rash or skin changes. She has seen a psychologist 2 times since her last visit with [place] but does not know yet if that will help. She is continuing her meds.  Review of Systems As above    Objective:   Physical Exam  Constitutional: She is oriented to person, place, and time. Vital signs are normal. She appears well-developed and well-nourished.  Obese, lying on exam table with eyes closed  HENT:  Head: Normocephalic and atraumatic.  Abdominal: Soft. Normal appearance. She exhibits no mass. Bowel sounds are increased. There is tenderness in the periumbilical area and left lower quadrant. There is no rigidity, no rebound, no guarding and no CVA tenderness.  Lymphadenopathy:    She has  no cervical adenopathy.  Neurological: She is alert and oriented to person, place, and time.  Skin: She is not diaphoretic.  Psychiatric: She has a normal mood and affect. Her behavior is normal. Thought content normal.   BP 102/58  Pulse 78  Temp(Src) 97.9 F (36.6 C) (Oral)  Resp 16  Ht 5' (1.524 m)  Wt 195 lb 12.8 oz (88.814 kg)  BMI 38.24 kg/m2  SpO2 96%  Results for orders placed in visit on 11/28/12  GLUCOSE, POCT (MANUAL RESULT ENTRY)      Result Value Range   POC Glucose 190 (*) 70 - 99 mg/dl  POCT GLYCOSYLATED HEMOGLOBIN (HGB A1C)      Result Value Range   Hemoglobin A1C 8.6     As patient was sitting up to leave, she began vomiting. She was given 25 mg IM of phenergan with relief.    Assessment & Plan:  Migraine - Likely triggered by acute GI illness.  She'll take a Relpax upon leaving here and picking up the new prescription at the pharmacy. Plan: eletriptan (RELPAX) 40 MG tablet  Diarrhea - Plan: dicyclomine (BENTYL) 10 MG capsule; hydrate.  Diabetes mellitus - Improving control. Plan: POCT glucose (manual entry), POCT glycosylated hemoglobin (Hb A1C)  Nausea with vomiting - Plan: promethazine (PHENERGAN) injection 25 mg  Patient Instructions  Get plenty of rest and drink at least 64 ounces of water daily. Take Relpax as soon as you get home. Repeat the dose in 2  hours if any headache persists.  Get a flu vaccine once you are well.

## 2012-11-29 NOTE — Progress Notes (Signed)
I have examined this patient along with the student and agree.  

## 2012-12-16 ENCOUNTER — Ambulatory Visit (INDEPENDENT_AMBULATORY_CARE_PROVIDER_SITE_OTHER): Payer: BC Managed Care – PPO | Admitting: Physician Assistant

## 2012-12-16 VITALS — BP 120/72 | HR 86 | Temp 98.2°F | Resp 18 | Wt 197.0 lb

## 2012-12-16 DIAGNOSIS — R5381 Other malaise: Secondary | ICD-10-CM

## 2012-12-16 DIAGNOSIS — G43909 Migraine, unspecified, not intractable, without status migrainosus: Secondary | ICD-10-CM

## 2012-12-16 DIAGNOSIS — E119 Type 2 diabetes mellitus without complications: Secondary | ICD-10-CM

## 2012-12-16 DIAGNOSIS — Z23 Encounter for immunization: Secondary | ICD-10-CM

## 2012-12-16 DIAGNOSIS — R5383 Other fatigue: Secondary | ICD-10-CM

## 2012-12-16 DIAGNOSIS — R110 Nausea: Secondary | ICD-10-CM

## 2012-12-16 LAB — POCT CBC
Granulocyte percent: 45.8 %G (ref 37–80)
HCT, POC: 44.2 % (ref 37.7–47.9)
Hemoglobin: 14 g/dL (ref 12.2–16.2)
MCV: 93.3 fL (ref 80–97)
POC Granulocyte: 4.4 (ref 2–6.9)
POC LYMPH PERCENT: 46.5 %L (ref 10–50)
RBC: 4.74 M/uL (ref 4.04–5.48)
RDW, POC: 14.8 %

## 2012-12-16 LAB — COMPREHENSIVE METABOLIC PANEL
Albumin: 4.1 g/dL (ref 3.5–5.2)
Alkaline Phosphatase: 96 U/L (ref 39–117)
BUN: 16 mg/dL (ref 6–23)
CO2: 24 mEq/L (ref 19–32)
Calcium: 9.6 mg/dL (ref 8.4–10.5)
Chloride: 106 mEq/L (ref 96–112)
Glucose, Bld: 151 mg/dL — ABNORMAL HIGH (ref 70–99)
Potassium: 4.6 mEq/L (ref 3.5–5.3)
Sodium: 140 mEq/L (ref 135–145)
Total Protein: 7.2 g/dL (ref 6.0–8.3)

## 2012-12-16 LAB — T3, FREE: T3, Free: 2.6 pg/mL (ref 2.3–4.2)

## 2012-12-16 LAB — T4, FREE: Free T4: 1.05 ng/dL (ref 0.80–1.80)

## 2012-12-16 MED ORDER — KETOROLAC TROMETHAMINE 30 MG/ML IJ SOLN
30.0000 mg | Freq: Once | INTRAMUSCULAR | Status: AC
  Administered 2012-12-16: 30 mg via INTRAMUSCULAR

## 2012-12-16 MED ORDER — PROMETHAZINE HCL 25 MG/ML IJ SOLN
25.0000 mg | Freq: Four times a day (QID) | INTRAMUSCULAR | Status: DC | PRN
  Administered 2012-12-16: 25 mg via INTRAMUSCULAR

## 2012-12-16 NOTE — Patient Instructions (Signed)
When you get home, take a dose of Relpax.  If you still have headache in 2 hours, take a second dose of Relpax. Use the phenergan as needed for nausea. I will let you know as soon as I get the results of your labs. It's possible that your somnolence is due to the Lamictal, so you may want to discuss it with Dr. Madaline Guthrie.

## 2012-12-16 NOTE — Progress Notes (Signed)
Subjective:    Patient ID: Michelle Torres, female    DOB: 07-23-1960, 52 y.o.   MRN: [id number]  HPI  This 52 y.o. female presents for evaluation of fatigue x 1 week, worse over the past several days. Slept all weekend.  Didn't feel like eating.  Didn't get up to drink liquids. Sunday after lunch, she had nausea, but was able to celebrate with family for her daughter's 21st birthday. "If she sat down, she fell asleep." BS this am was 163, which is lower than it has been fasting in the mornings lately.  Feels dizzy. Mildly loose stools, but not the diarrhea she had several weeks ago. This morning, she developed a migraine HA.  She's taken Aleve, but has not taken Relpax. The did not bring any Relax with her, thinking that the HA would resolve.  Unfortunately, like at her last visit, it has worsened.  No atypical features of this HA compared to her usual migraine.  Only recent medication change was an increase in her Lamictal dose about 3 weeks ago.  Medications, allergies, past medical history, surgical history, family history, social history and problem list reviewed.   Review of Systems As above.  No chest pain, SOB, difficulty swallowing.    Objective:   Physical Exam Blood pressure 120/72, pulse 86, temperature 98.2 F (36.8 C), temperature source Oral, resp. rate 18, weight 197 lb (89.359 kg), SpO2 96.00%. Body mass index is 38.47 kg/(m^2). Well-developed, well nourished WF who is awake, alert and oriented, in NAD, but appears very sleepy. HEENT: [place]/AT, PERRL, EOMI.  Sclera and conjunctiva are clear.  EAC are patent, TMs are normal in appearance. Nasal mucosa is pink and moist. OP is clear. Neck: supple, non-tender, no lymphadenopathy, thyromegaly. Heart: RRR, no murmur Lungs: normal effort, CTA Abdomen: normo-active bowel sounds, supple, no mass or organomegaly. Mild diffuse tenderness. Extremities: no cyanosis, clubbing or edema. Skin: warm and dry without rash. Psychologic: good  mood and appropriate affect, normal speech and behavior.  Results for orders placed in visit on 12/16/12  POCT CBC      Result Value Range   WBC 9.6  4.6 - 10.2 K/uL   Lymph, poc 4.5 (*) 0.6 - 3.4   POC LYMPH PERCENT 46.5  10 - 50 %L   MID (cbc) 0.7  0 - 0.9   POC MID % 7.7  0 - 12 %M   POC Granulocyte 4.4  2 - 6.9   Granulocyte percent 45.8  37 - 80 %G   RBC 4.74  4.04 - 5.48 M/uL   Hemoglobin 14.0  12.2 - 16.2 g/dL   HCT, POC 40.9  81.1 - 47.9 %   MCV 93.3  80 - 97 fL   MCH, POC 29.5  27 - 31.2 pg   MCHC 31.7 (*) 31.8 - 35.4 g/dL   RDW, POC 91.4     Platelet Count, POC 320  142 - 424 K/uL   MPV 8.7  0 - 99.8 fL         Assessment & Plan:  Fatigue - Plan: TSH, T3, Free, T4, Free, Thyroid Peroxidase Antibody, POCT CBC, Comprehensive metabolic panel; if all normal, consider the possibility that this symptom is due to the increased dose of Lamictal.  Migraine - Plan: ketorolac (TORADOL) 30 MG/ML injection 30 mg, promethazine (PHENERGAN) injection 25 mg; encouraged to take Relpax upon arriving at home, and to repeat the dose in 2 hours if any HA persists.  Reminded that it's a good  idea to keep 2 doses of Relpax with her, so that she can treat a headache early, even if she is away from home.  Nausea alone - due to migraine.  Phenergan, as above.  Diabetes mellitus - improving control.  Last A1c was 11/28/2012.  Need for influenza vaccination - vaccine administered today.  Fernande Bras, PA-C Physician Assistant-Certified [facility]

## 2012-12-17 LAB — THYROID PEROXIDASE ANTIBODY: Thyroperoxidase Ab SerPl-aCnc: 19.9 IU/mL (ref <35.0)

## 2013-01-21 ENCOUNTER — Ambulatory Visit (INDEPENDENT_AMBULATORY_CARE_PROVIDER_SITE_OTHER): Payer: BC Managed Care – PPO | Admitting: Emergency Medicine

## 2013-01-21 ENCOUNTER — Ambulatory Visit (HOSPITAL_COMMUNITY)
Admission: RE | Admit: 2013-01-21 | Discharge: 2013-01-21 | Disposition: A | Discharge status: Home / Self Care | Payer: BC Managed Care – PPO | Source: Ambulatory Visit | Attending: Emergency Medicine | Admitting: Emergency Medicine

## 2013-01-21 VITALS — BP 108/76 | HR 81 | Temp 98.6°F | Resp 16 | Ht 60.5 in | Wt 198.0 lb

## 2013-01-21 DIAGNOSIS — R110 Nausea: Secondary | ICD-10-CM

## 2013-01-21 DIAGNOSIS — H532 Diplopia: Secondary | ICD-10-CM | POA: Insufficient documentation

## 2013-01-21 DIAGNOSIS — F329 Major depressive disorder, single episode, unspecified: Secondary | ICD-10-CM | POA: Insufficient documentation

## 2013-01-21 DIAGNOSIS — F3289 Other specified depressive episodes: Secondary | ICD-10-CM | POA: Insufficient documentation

## 2013-01-21 DIAGNOSIS — R51 Headache: Secondary | ICD-10-CM

## 2013-01-21 DIAGNOSIS — E119 Type 2 diabetes mellitus without complications: Secondary | ICD-10-CM

## 2013-01-21 MED ORDER — GADOBENATE DIMEGLUMINE 529 MG/ML IV SOLN
20.0000 mL | Freq: Once | INTRAVENOUS | Status: AC | PRN
  Administered 2013-01-21: 19 mL via INTRAVENOUS

## 2013-01-21 NOTE — Progress Notes (Addendum)
Subjective:    Patient ID: Michelle Torres, female    DOB: 1961/01/05, 52 y.o.   MRN: [id number] This chart was scribed for Collene Gobble, MD by Valera Castle, ED Scribe. This patient was seen in room 3 and the patient's care was started at 8:10 AM.  Chief Complaint  Patient presents with  . Migraine    DOUBLE VISION, NAUSEA, VOMITING  . HIGH SUGAR    THIS MORNING 214, YESTERDAY 317  . Abdominal Pain    GAS  . NEEDS WORK NOTE WITH THIS    HPI Michelle Torres is a 52 y.o. female with a h/o DM who presents to the [facility] complaining of unusually high sugar levels. She reports Monday her levels were 340+, and states yesterday it was at 319 in the morning, when she was fasting. She states this morning her level was around 240. She reports an associated migraine that started Monday. She reports applying ice packs on her eyes for the pain, with little relief. She also reports double vision, stating her right eye is fine, but her left eye is seeing double. She reports an associated episode of nausea and emesis yesterday morning. She reports taking Phenergan for the nausea, with relief. She reports since she became ill Monday, she has been eating less, and when she has been eating she has been eating soup. She also reports associated dizziness.  She reports seeing psychiatrist, Dr. Phillip Heal, off of [facility] for her medication refills. She states when she comes to the [facility] she usually sees Dr. Avelino Leeds.  Pt had brain MRI done in 2010 with negative results. She has had recent blood work done here at the [facility] in 07/2012 and 11/2012, with negative results except for elevated sugar.  She sees Dr. Heather Burundi - eye Dr.    She denies any allergies to contrast.   She denies having any other associated symptoms.   She denies having been to work yet this week. 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  PCP - Lucilla Edin, MD  Patient Active Problem List    Diagnosis Date Noted  . Bipolar 2 disorder 03/05/2011  . Diabetes mellitus 03/05/2011  . Fatty infiltration of liver 03/02/2011   Past Medical History  Diagnosis Date  . Diabetes mellitus   . Bipolar disorder   . High cholesterol   . Fatty liver disease, nonalcoholic   . Depression   . GERD (gastroesophageal reflux disease)   . Anxiety   . Hypertension    Past Surgical History  Procedure Laterality Date  . Laparoscopic cholecystectomy  1993  . Dilation and curettage of uterus    . Ercp  03/03/2011    Procedure: ENDOSCOPIC RETROGRADE CHOLANGIOPANCREATOGRAPHY (ERCP);  Surgeon: Hart Carwin, MD;  Location: [facility] END[facility];  Service: Endoscopy;  Laterality: N/A;   Allergies  Allergen Reactions  . Risperidone And Related Other (See Comments)    liver damage   Prior to Admission medications   Medication Sig Start Date End Date Taking? Authorizing Provider  aspirin 325 MG tablet Take 325 mg by mouth daily.     Yes Historical Provider, MD  Calcium Carbonate-Vitamin D (OSCAL 500/200 D-3 PO) Take 2 tablets by mouth daily.    Yes Historical Provider, MD  cholestyramine Lanetta Inch) 4 GM/DOSE powder Take 1 packet (4 g total) by mouth daily. 08/20/12  Yes Rachael Fee, MD  clonazePAM (KLONOPIN) 1 MG tablet Take 4 mg by mouth at  bedtime.    Yes Historical Provider, MD  divalproex (DEPAKOTE ER) 500 MG 24 hr tablet Take 1,000 mg by mouth 3 (three) times daily.    Yes Historical Provider, MD  eletriptan (RELPAX) 40 MG tablet Take 1 tablet (40 mg total) by mouth as needed. may repeat in 2 hours if necessary 11/28/12  Yes Chelle S Jeffery, PA-C  fluticasone (FLONASE) 50 MCG/ACT nasal spray Place 2 sprays into the nose as needed for rhinitis.   Yes Historical Provider, MD  gabapentin (NEURONTIN) 600 MG tablet Take 600 mg by mouth at bedtime.     Yes Historical Provider, MD  hydrOXYzine (ATARAX/VISTARIL) 25 MG tablet Take 1-3 tablets (25-75 mg total) by mouth every 8 (eight) hours as needed for  anxiety. 10/02/12  Yes Chelle S Jeffery, PA-C  insulin glargine (LANTUS) 100 UNIT/ML injection Inject 0.5 mLs (50 Units total) into the skin at bedtime. Increase dose by 2 units every morning until fasting glucose is below 140 mg/dl. start at 40 units every evening and increase as instructed 09/18/12  Yes Chelle S Jeffery, PA-C  ipratropium (ATROVENT) 0.03 % nasal spray Place 2 sprays into the nose 2 (two) times daily. 10/25/12  Yes Chelle S Jeffery, PA-C  lamoTRIgine (LAMICTAL) 100 MG tablet Take 150 mg by mouth every morning.    Yes Historical Provider, MD  metFORMIN (GLUCOPHAGE) 1000 MG tablet TAKE 1 TABLET BY MOUTH TWICE A DAY WITH A MEAL 10/28/12  Yes Chelle S Jeffery, PA-C  naproxen (NAPROSYN) 500 MG tablet Take 500 mg by mouth 2 (two) times daily as needed (for pain).    Yes Historical Provider, MD  OXcarbazepine (TRILEPTAL) 150 MG tablet Take 150 mg by mouth at bedtime.     Yes Historical Provider, MD  polyethylene glycol (MIRALAX / GLYCOLAX) packet Take 17 g by mouth as needed. 09/18/12  Yes Glynn Octave, MD  pregabalin (LYRICA) 75 MG capsule Take 75 mg by mouth every morning.    Yes Historical Provider, MD  promethazine (PHENERGAN) 25 MG tablet Take 1 tablet (25 mg total) by mouth every 6 (six) hours as needed. 07/30/12  Yes Chelle S Jeffery, PA-C  topiramate (TOPAMAX) 100 MG tablet Take 100 mg by mouth at bedtime.     Yes Historical Provider, MD    Review of Systems  Eyes: Positive for visual disturbance (double vision, left eye).  Gastrointestinal: Positive for nausea and vomiting.  Neurological: Positive for dizziness and headaches (migraines).    Triage Vitals: BP 108/76  Pulse 81  Temp(Src) 98.6 F (37 C) (Oral)  Resp 16  Ht 5' 0.5" (1.537 m)  Wt 198 lb (89.812 kg)  BMI 38.02 kg/m2  SpO2 96%     Objective:   Physical Exam Nursing note and vitals reviewed. Constitutional: Pt is oriented to person, place, and time. Pt appears well-developed and well-nourished. No distress.     Head: Normocephalic and atraumatic.  Eyes: EOM are normal. Pupils are equal, round, and reactive to light.  Neck: Neck supple. No tracheal deviation present.  Cardiovascular: Normal rate, regular rhythm and normal heart sounds.  Exam reveals no gallop and no friction rub. No murmur heard. Pulmonary/Chest: Effort normal and breath sounds normal. No respiratory distress. Pt has no wheezes. Pt has no rales.  Abdominal: Soft. Bowel sounds are normal. There is no tenderness. There is no rebound and no guarding.  Musculoskeletal: Normal range of motion.  Neurological: Pt is alert and oriented to person, place, and time. I do not detect any extra  ocular muscle paralysis. When she gazes to the left and up she describes diplopia. She feels well, with her left eye closed. She does not  dysmetria but  she did need assistance getting to the exam room because she stated she was seeing double. Deep tendon reflexes upper and lower extremities are 2+ motor strength 5 out of 5 her disc margins are sharp without hemorrhages or exudates. Skin: Skin is warm and dry.  Psychiatric: Pt has a normal mood and affect. Pt's behavior is normal.  Results for orders placed in visit on 01/21/13  GLUCOSE, POCT (MANUAL RESULT ENTRY)      Result Value Range   POC Glucose 214 (*) 70 - 99 mg/dl      Assessment & Plan:   Fasting sugar will be done. She may have a cranial nerve palsy related to her diabetes. I suspect this would be of the left sixth cranial nerve. This is the nerve most likely affected by diabetes. we'll proceed with an MRI of the brain with contrast. Emergency ophthalmological evaluation will also be requested to. Fasting sugar will be done.   I personally performed the services described in this documentation, which was scribed in my presence. The recorded information has been reviewed and is accurate.

## 2013-01-25 ENCOUNTER — Other Ambulatory Visit: Payer: Self-pay | Admitting: Emergency Medicine

## 2013-01-26 ENCOUNTER — Other Ambulatory Visit: Payer: Self-pay

## 2013-02-04 ENCOUNTER — Emergency Department (HOSPITAL_COMMUNITY)
Admission: EM | Admit: 2013-02-04 | Discharge: 2013-02-04 | Disposition: A | Discharge status: Home / Self Care | Payer: BC Managed Care – PPO | Attending: Emergency Medicine | Admitting: Emergency Medicine

## 2013-02-04 ENCOUNTER — Encounter (HOSPITAL_COMMUNITY): Payer: Self-pay | Admitting: Emergency Medicine

## 2013-02-04 DIAGNOSIS — Z888 Allergy status to other drugs, medicaments and biological substances status: Secondary | ICD-10-CM | POA: Insufficient documentation

## 2013-02-04 DIAGNOSIS — Z7982 Long term (current) use of aspirin: Secondary | ICD-10-CM | POA: Insufficient documentation

## 2013-02-04 DIAGNOSIS — Z794 Long term (current) use of insulin: Secondary | ICD-10-CM | POA: Insufficient documentation

## 2013-02-04 DIAGNOSIS — K219 Gastro-esophageal reflux disease without esophagitis: Secondary | ICD-10-CM | POA: Insufficient documentation

## 2013-02-04 DIAGNOSIS — H538 Other visual disturbances: Secondary | ICD-10-CM

## 2013-02-04 DIAGNOSIS — Z3202 Encounter for pregnancy test, result negative: Secondary | ICD-10-CM | POA: Insufficient documentation

## 2013-02-04 DIAGNOSIS — H409 Unspecified glaucoma: Secondary | ICD-10-CM | POA: Insufficient documentation

## 2013-02-04 DIAGNOSIS — Z79899 Other long term (current) drug therapy: Secondary | ICD-10-CM | POA: Insufficient documentation

## 2013-02-04 DIAGNOSIS — H269 Unspecified cataract: Secondary | ICD-10-CM | POA: Insufficient documentation

## 2013-02-04 DIAGNOSIS — Z8719 Personal history of other diseases of the digestive system: Secondary | ICD-10-CM | POA: Insufficient documentation

## 2013-02-04 DIAGNOSIS — E7800 Pure hypercholesterolemia, unspecified: Secondary | ICD-10-CM | POA: Insufficient documentation

## 2013-02-04 DIAGNOSIS — F3289 Other specified depressive episodes: Secondary | ICD-10-CM | POA: Insufficient documentation

## 2013-02-04 DIAGNOSIS — H532 Diplopia: Secondary | ICD-10-CM | POA: Insufficient documentation

## 2013-02-04 DIAGNOSIS — R197 Diarrhea, unspecified: Secondary | ICD-10-CM | POA: Insufficient documentation

## 2013-02-04 DIAGNOSIS — R1013 Epigastric pain: Secondary | ICD-10-CM | POA: Insufficient documentation

## 2013-02-04 DIAGNOSIS — I10 Essential (primary) hypertension: Secondary | ICD-10-CM | POA: Insufficient documentation

## 2013-02-04 DIAGNOSIS — E119 Type 2 diabetes mellitus without complications: Secondary | ICD-10-CM | POA: Insufficient documentation

## 2013-02-04 DIAGNOSIS — F329 Major depressive disorder, single episode, unspecified: Secondary | ICD-10-CM | POA: Insufficient documentation

## 2013-02-04 DIAGNOSIS — R42 Dizziness and giddiness: Secondary | ICD-10-CM | POA: Insufficient documentation

## 2013-02-04 DIAGNOSIS — F411 Generalized anxiety disorder: Secondary | ICD-10-CM | POA: Insufficient documentation

## 2013-02-04 DIAGNOSIS — F319 Bipolar disorder, unspecified: Secondary | ICD-10-CM | POA: Insufficient documentation

## 2013-02-04 DIAGNOSIS — H5500 Unspecified nystagmus: Secondary | ICD-10-CM | POA: Insufficient documentation

## 2013-02-04 DIAGNOSIS — R51 Headache: Secondary | ICD-10-CM | POA: Insufficient documentation

## 2013-02-04 DIAGNOSIS — R112 Nausea with vomiting, unspecified: Secondary | ICD-10-CM | POA: Insufficient documentation

## 2013-02-04 DIAGNOSIS — R5381 Other malaise: Secondary | ICD-10-CM | POA: Insufficient documentation

## 2013-02-04 LAB — COMPREHENSIVE METABOLIC PANEL
ALT: 49 U/L — ABNORMAL HIGH (ref 0–35)
AST: 36 U/L (ref 0–37)
Alkaline Phosphatase: 97 U/L (ref 39–117)
BUN: 14 mg/dL (ref 6–23)
CO2: 22 mEq/L (ref 19–32)
Calcium: 9.2 mg/dL (ref 8.4–10.5)
GFR calc non Af Amer: 82 mL/min — ABNORMAL LOW (ref >90)
Potassium: 3.7 mEq/L (ref 3.5–5.1)
Sodium: 138 mEq/L (ref 135–145)
Total Protein: 7.6 g/dL (ref 6.0–8.3)

## 2013-02-04 LAB — URINALYSIS, ROUTINE W REFLEX MICROSCOPIC
Ketones, ur: 15 mg/dL — AB
Leukocytes, UA: NEGATIVE
Nitrite: NEGATIVE
Protein, ur: NEGATIVE mg/dL
Specific Gravity, Urine: 1.02 (ref 1.005–1.030)
Urobilinogen, UA: 0.2 mg/dL (ref 0.0–1.0)

## 2013-02-04 LAB — CBC WITH DIFFERENTIAL/PLATELET
Basophils Absolute: 0.1 10*3/uL (ref 0.0–0.1)
Eosinophils Absolute: 0.1 10*3/uL (ref 0.0–0.7)
Eosinophils Relative: 0 % (ref 0–5)
HCT: 42.5 % (ref 36.0–46.0)
Lymphocytes Relative: 27 % (ref 12–46)
Lymphs Abs: 3.9 10*3/uL (ref 0.7–4.0)
MCV: 87.8 fL (ref 78.0–100.0)
Monocytes Relative: 9 % (ref 3–12)
Neutro Abs: 9.2 10*3/uL — ABNORMAL HIGH (ref 1.7–7.7)
Neutrophils Relative %: 64 % (ref 43–77)
Platelets: 245 10*3/uL (ref 150–400)
RBC: 4.84 MIL/uL (ref 3.87–5.11)
RDW: 13.9 % (ref 11.5–15.5)
WBC: 14.5 10*3/uL — ABNORMAL HIGH (ref 4.0–10.5)

## 2013-02-04 MED ORDER — ONDANSETRON HCL 4 MG PO TABS: 4.0000 mg | ORAL_TABLET | Freq: Three times a day (TID) | ORAL | Status: DC | PRN

## 2013-02-04 MED ORDER — DIPHENHYDRAMINE HCL 50 MG/ML IJ SOLN
25.0000 mg | Freq: Once | INTRAMUSCULAR | Status: AC
  Administered 2013-02-04: 25 mg via INTRAVENOUS
  Filled 2013-02-04: qty 1

## 2013-02-04 MED ORDER — ONDANSETRON HCL 4 MG/2ML IJ SOLN
4.0000 mg | Freq: Once | INTRAMUSCULAR | Status: AC
  Administered 2013-02-04: 4 mg via INTRAVENOUS
  Filled 2013-02-04: qty 2

## 2013-02-04 MED ORDER — TETRACAINE HCL 0.5 % OP SOLN
1.0000 [drp] | Freq: Once | OPHTHALMIC | Status: AC
  Administered 2013-02-04: 2 [drp] via OPHTHALMIC
  Filled 2013-02-04: qty 2

## 2013-02-04 MED ORDER — METOCLOPRAMIDE HCL 5 MG/ML IJ SOLN
10.0000 mg | Freq: Once | INTRAMUSCULAR | Status: AC
  Administered 2013-02-04: 10 mg via INTRAVENOUS
  Filled 2013-02-04: qty 2

## 2013-02-04 MED ORDER — SODIUM CHLORIDE 0.9 % IV BOLUS (SEPSIS)
1000.0000 mL | Freq: Once | INTRAVENOUS | Status: AC
  Administered 2013-02-04: 1000 mL via INTRAVENOUS

## 2013-02-04 NOTE — ED Provider Notes (Signed)
CSN: [id number]     Arrival date & time 02/04/13  8295 History   None    Chief Complaint  Patient presents with  . Eye Problem  . Emesis   (Consider location/radiation/quality/duration/timing/severity/associated sxs/prior Treatment) HPI [name] is a 52 y.o. female w/ PMHx of DM type II, bipolar disorder, HLD, GERD,  depression, anxiety, presents to the ED w/ complaints of acute blurring of vision of the right eye, starting at 7:30 AM this morning. She then claims she had accompanying dizziness, nausea, followed by frequent vomiting and abdominal pain, located in the epigastric region.. She also says she has had diarrhea since yesterday. She claims that she recently saw Dr. Alden Hipp w/ ophthalmology for her L eye, as she had recently had double vision in this eye, causing her significant gaze discomfort, now wearing an eye patch. Patient says she has a cataract and early glaucoma, according to ophtho.   Past Medical History  Diagnosis Date  . Diabetes mellitus   . Bipolar disorder   . High cholesterol   . Fatty liver disease, nonalcoholic   . Depression   . GERD (gastroesophageal reflux disease)   . Anxiety   . Hypertension    Past Surgical History  Procedure Laterality Date  . Laparoscopic cholecystectomy  1993  . Dilation and curettage of uterus    . Ercp  03/03/2011    Procedure: ENDOSCOPIC RETROGRADE CHOLANGIOPANCREATOGRAPHY (ERCP);  Surgeon: Hart Carwin, MD;  Location: [facility] ENDOSCOPY;  Service: Endoscopy;  Laterality: N/A;   Family History  Problem Relation Age of Onset  . Mental illness Father     Manic Depression  . Alcohol abuse Father   . Stroke Father   . Heart disease Father   . Arthritis Son   . Depression Son   . Breast cancer Maternal Grandmother   . Breast cancer Paternal Grandmother    History  Substance Use Topics  . Smoking status: Never Smoker   . Smokeless tobacco: Never Used  . Alcohol Use: 0.5 oz/week    1 drink(s) per week     Comment:  once every couple months   OB History   Grav Para Term Preterm Abortions TAB SAB Ect Mult Living                 Review of Systems General: Positive for fatigue. Denies fever, chills, diaphoresis, appetite change.  HEENT: Positive for R eye blurry vision + L eye double vision. Respiratory: Denies SOB, DOE, cough, chest tightness, and wheezing.   Cardiovascular: Denies chest pain, palpitations and leg swelling.  Gastrointestinal: Positive for nausea, vomiting, abdominal pain, diarrhea. Denies constipation, blood in stool and abdominal distention.  Genitourinary: Denies dysuria, urgency, frequency, hematuria, flank pain and difficulty urinating.  Endocrine: Denies hot or cold intolerance, sweats, polyuria, polydipsia. Musculoskeletal: Denies myalgias, back pain, joint swelling, arthralgias and gait problem.  Skin: Denies pallor, rash and wounds.  Neurological: Positive for dizziness, weakness, and headache. Denies seizures, syncope, weakness, lightheadedness, numbness and headaches.  Psychiatric/Behavioral: Denies mood changes, confusion, nervousness, sleep disturbance and agitation.  Allergies  Risperidone and related  Home Medications   Current Outpatient Rx  Name  Route  Sig  Dispense  Refill  . aspirin 325 MG tablet   Oral   Take 325 mg by mouth daily.           . Calcium Carbonate-Vitamin D (OSCAL 500/200 D-3 PO)   Oral   Take 2 tablets by mouth daily.          Marland Kitchen  cholestyramine (QUESTRAN) 4 GM/DOSE powder   Oral   Take 1 packet (4 g total) by mouth daily.   378 g   12   . clonazePAM (KLONOPIN) 1 MG tablet      TAKE 3 TABLETS AT BEDTIME AND MAY TAKE 1/2-1 TABLET DURING THE DAY AS NEEDED   360 tablet   1   . divalproex (DEPAKOTE ER) 500 MG 24 hr tablet   Oral   Take 1,000 mg by mouth 3 (three) times daily.          Marland Kitchen eletriptan (RELPAX) 40 MG tablet   Oral   Take 1 tablet (40 mg total) by mouth as needed. may repeat in 2 hours if necessary   10 tablet    1   . fluticasone (FLONASE) 50 MCG/ACT nasal spray   Nasal   Place 2 sprays into the nose as needed for rhinitis.         Marland Kitchen gabapentin (NEURONTIN) 600 MG tablet   Oral   Take 600 mg by mouth at bedtime.           . hydrOXYzine (ATARAX/VISTARIL) 25 MG tablet   Oral   Take 1-3 tablets (25-75 mg total) by mouth every 8 (eight) hours as needed for anxiety.   90 tablet   0   . insulin glargine (LANTUS) 100 UNIT/ML injection   Subcutaneous   Inject 0.5 mLs (50 Units total) into the skin at bedtime. Increase dose by 2 units every morning until fasting glucose is below 140 mg/dl. start at 40 units every evening and increase as instructed   10 mL   5   . ipratropium (ATROVENT) 0.03 % nasal spray   Nasal   Place 2 sprays into the nose 2 (two) times daily.   30 mL   0   . lamoTRIgine (LAMICTAL) 100 MG tablet   Oral   Take 150 mg by mouth every morning.          . metFORMIN (GLUCOPHAGE) 1000 MG tablet      TAKE 1 TABLET BY MOUTH TWICE A DAY WITH A MEAL   180 tablet   2   . naproxen (NAPROSYN) 500 MG tablet   Oral   Take 500 mg by mouth 2 (two) times daily as needed (for pain).          . OXcarbazepine (TRILEPTAL) 150 MG tablet   Oral   Take 150 mg by mouth at bedtime.           . polyethylene glycol (MIRALAX / GLYCOLAX) packet   Oral   Take 17 g by mouth as needed.         . pregabalin (LYRICA) 75 MG capsule   Oral   Take 75 mg by mouth every morning.          . promethazine (PHENERGAN) 25 MG tablet   Oral   Take 1 tablet (25 mg total) by mouth every 6 (six) hours as needed.   30 tablet   1   . topiramate (TOPAMAX) 100 MG tablet   Oral   Take 100 mg by mouth at bedtime.            Physical Exam Filed Vitals:   02/04/13 0941 02/04/13 1129  BP: 110/68 114/62  Pulse: 71 73  Temp: 99.2 F (37.3 C) 97.6 F (36.4 C)  TempSrc: Oral Oral  Resp: 20 18  SpO2: 97% 92%   General: Vital signs reviewed.  Patient is a  well-developed and  well-nourished, in no acute distress and cooperative with exam. Alert and oriented x3.  Head: Normocephalic and atraumatic. Eyes: PERRL, EOMI, horizontal nystagmus w/ left lateral gaze, conjunctivae normal, No scleral icterus.  Neck: Supple, trachea midline, normal ROM, No JVD, masses, thyromegaly, or carotid bruit present.  Cardiovascular: RRR, S1 normal, S2 normal, no murmurs, gallops, or rubs. Pulmonary/Chest: Normal respiratory effort, CTAB, no wheezes, rales, or rhonchi. Abdominal: Soft, mildly tender in the epigastrium, non-distended, bowel sounds are normal, no masses, organomegaly, or guarding present. Vomited prior to exam.  Musculoskeletal: No joint deformities, erythema, or stiffness, ROM full and no nontender. Extremities: No swelling or edema,  pulses symmetric and intact bilaterally. No cyanosis or clubbing. Neurological: A&O x3, Strength is normal and symmetric bilaterally, cranial nerve II-XII are grossly intact, no focal motor deficit, sensory intact to light touch bilaterally.  Skin: Warm, dry and intact. No rashes or erythema. Psychiatric: Normal mood and affect. speech and behavior is normal. Cognition and memory are normal.   ED Course  Procedures (including critical care time) Labs Review Labs Reviewed  URINALYSIS, ROUTINE W REFLEX MICROSCOPIC  CBC WITH DIFFERENTIAL  COMPREHENSIVE METABOLIC PANEL  LIPASE, BLOOD   Imaging Review No results found.  EKG Interpretation   None       MDM   Ms. Areliz Rothman Kinkead is a 52 y.o. female w/ PMHx of DM type II, bipolar disorder, HLD, GERD,  depression, anxiety, presents to the ED w/ complaints of acute blurring of vision of the right eye, nausea, vomiting, abdominal pain, and diarrhea. Possibly d/t complex migraines, or acute open angle glaucoma. -Give migraine cocktail; reglan 10 mg + benadryl 25 mg -Give NS 1L bolus -Zofran 4 mg  -Tonometry shows pressures 8-10 in both eyes. -CBC shows leukocytosis to 14.5. CMET wnl,  lipase 31 -Visual acuity screening 20/40 in both eyes bilaterally. Patient claims she is @ or near her baseline during exam. -Discussed w/ Dr. Dione Booze, will see patient for follow up, agrees with plan.   Patient significantly improved w/ symptomatic treatment and NS bolus. Tonometry does not suggest glaucoma. Most likely resolving GI illness w/ migraine headache at this time. Will advise patient to follow up with Dr. Dorthey Sawyer as well as PCP in 1-2 weeks.   Courtney Paris, MD 02/04/13 [phone]

## 2013-02-04 NOTE — ED Notes (Signed)
Expressed concerns about pt BP, pt states her normal BP is 90/60.

## 2013-02-04 NOTE — ED Notes (Signed)
MD at bedside. 

## 2013-02-04 NOTE — ED Notes (Signed)
Pt states she has hx of eye problems and sees Dr Alden Hipp.  Pt presents today with c/o R eye blurry vision, vomiting, and dizziness.  Pt recently had MRI for further eval and it was unremarkable.

## 2013-02-04 NOTE — ED Provider Notes (Signed)
I saw and evaluated the patient, reviewed the resident's note and I agree with the findings and plan.  EKG Interpretation   None       52 yo female with sudden blurry of vision in right eye several hours ago.  No eye pain.  Now she also has a headache (which she says is not quite as bad as her typical migraines) and vomiting.  She had a diarrheal illness yesterday, which has since resolved.  Plan migraine treatment, visual acuity testing, tonometry.    HA and visual disturbances better after medications.  VA shows equal vision bilaterally.  Symptoms probably secondary to complicated migraine in setting of dehydration from acute diarrheal illness.  Doubt CVA or TIA.  Will follow up with PCP and Ophthalmologist.    Clinical Impression: 1. Nausea vomiting and diarrhea   2. Blurring of visual image of right eye       Candyce Churn, MD 02/05/13 2228

## 2013-02-04 NOTE — ED Notes (Signed)
Pt ambulated to restroom with 1 person assist. States that she felt "wobbly but not as dizzy"

## 2013-02-05 NOTE — ED Provider Notes (Signed)
I saw and evaluated the patient, reviewed the resident's note and I agree with the findings and plan.  EKG Interpretation   None         Candyce Churn, MD 02/05/13 2228

## 2013-02-24 ENCOUNTER — Telehealth: Payer: Self-pay

## 2013-02-24 NOTE — Telephone Encounter (Signed)
Patient is calling to see if any records have come in from Dr. Zenia Resides office  [phone] (work)

## 2013-02-25 NOTE — Telephone Encounter (Signed)
Spoke with patient. The Jan 21, 2013 from Dr Zenia Resides office is scanned in [place]. She asked about a follow up visit from Dr Katy Fitch but I didn't see any other visits other than Dec 3. Also, says she came in the office on Dec 17 and Dr Everlene Farrier sent her to the Emergency Dept at [facility] but there is no documentation in Epic where she was seen at [facility] on Dec 17. I did see her hospital visit. Says she may have to do FMLA and will contact our office if she needs anything else.

## 2013-03-06 NOTE — Telephone Encounter (Signed)
Dr. Everlene Farrier: (this was sent through e-mail from patient)  FYI:  I was sick with vomiting and mild fever last Friday (took your meds.) and didn't go to work. I have had a migraine since yesterday and I took my prescribed Relpax which did not make the pain subside while I was at work. Today, Thursday, I still have a migraine, nausea and taken more Relpax and may have to take a phenergran to stop the nausea. If I am not better, I will come into the office.

## 2013-03-07 NOTE — Telephone Encounter (Signed)
We're happy to see Michelle Torres if she continues to have problems

## 2013-03-08 NOTE — Telephone Encounter (Signed)
Spoke with patient and she was given Chelle's schedule for this week

## 2013-03-22 IMAGING — CR DG CHEST 2V
2 series · 2 of 2 positions shown · non-contrast
Comparison: 

CLINICAL DATA: Chest pain, diaphoresis and nausea.

CHEST - 2 VIEW

[w chest pa]
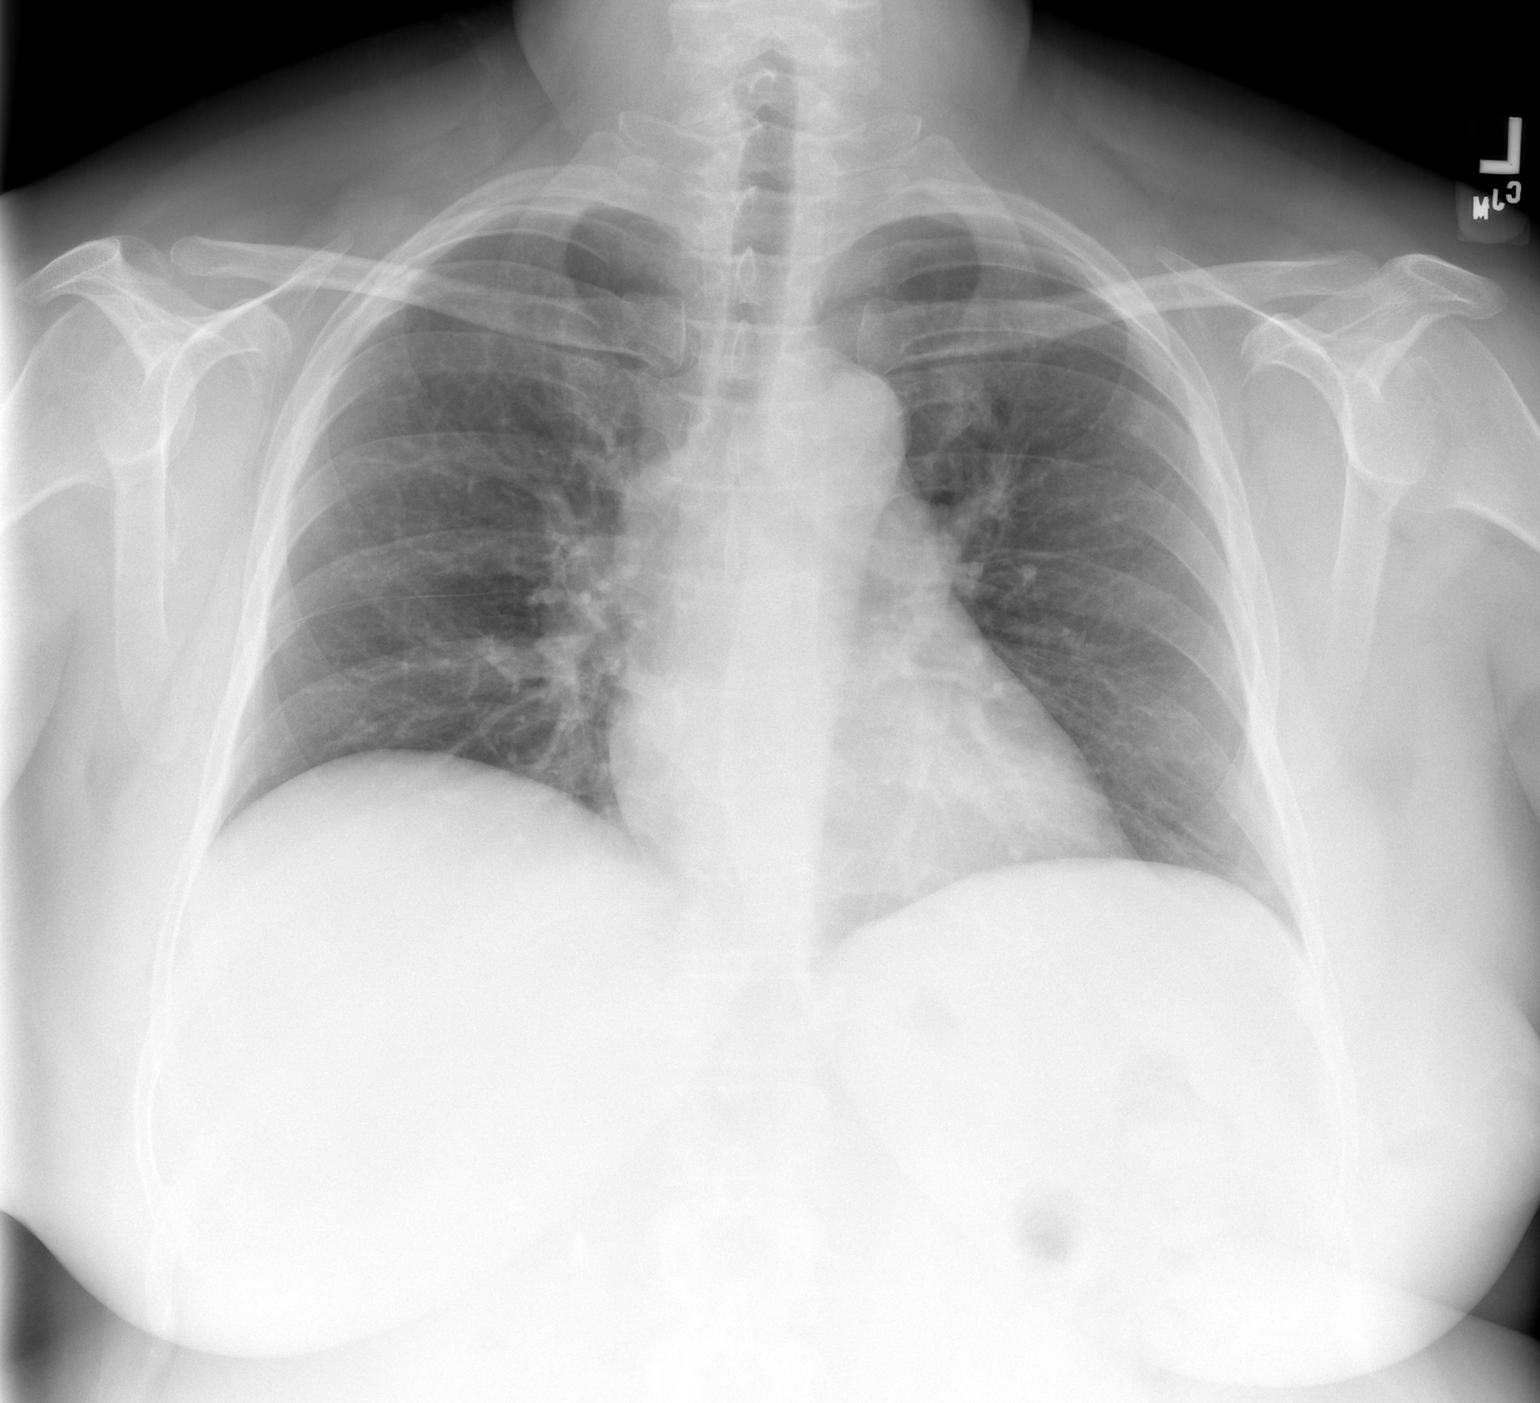

[w chest lat]
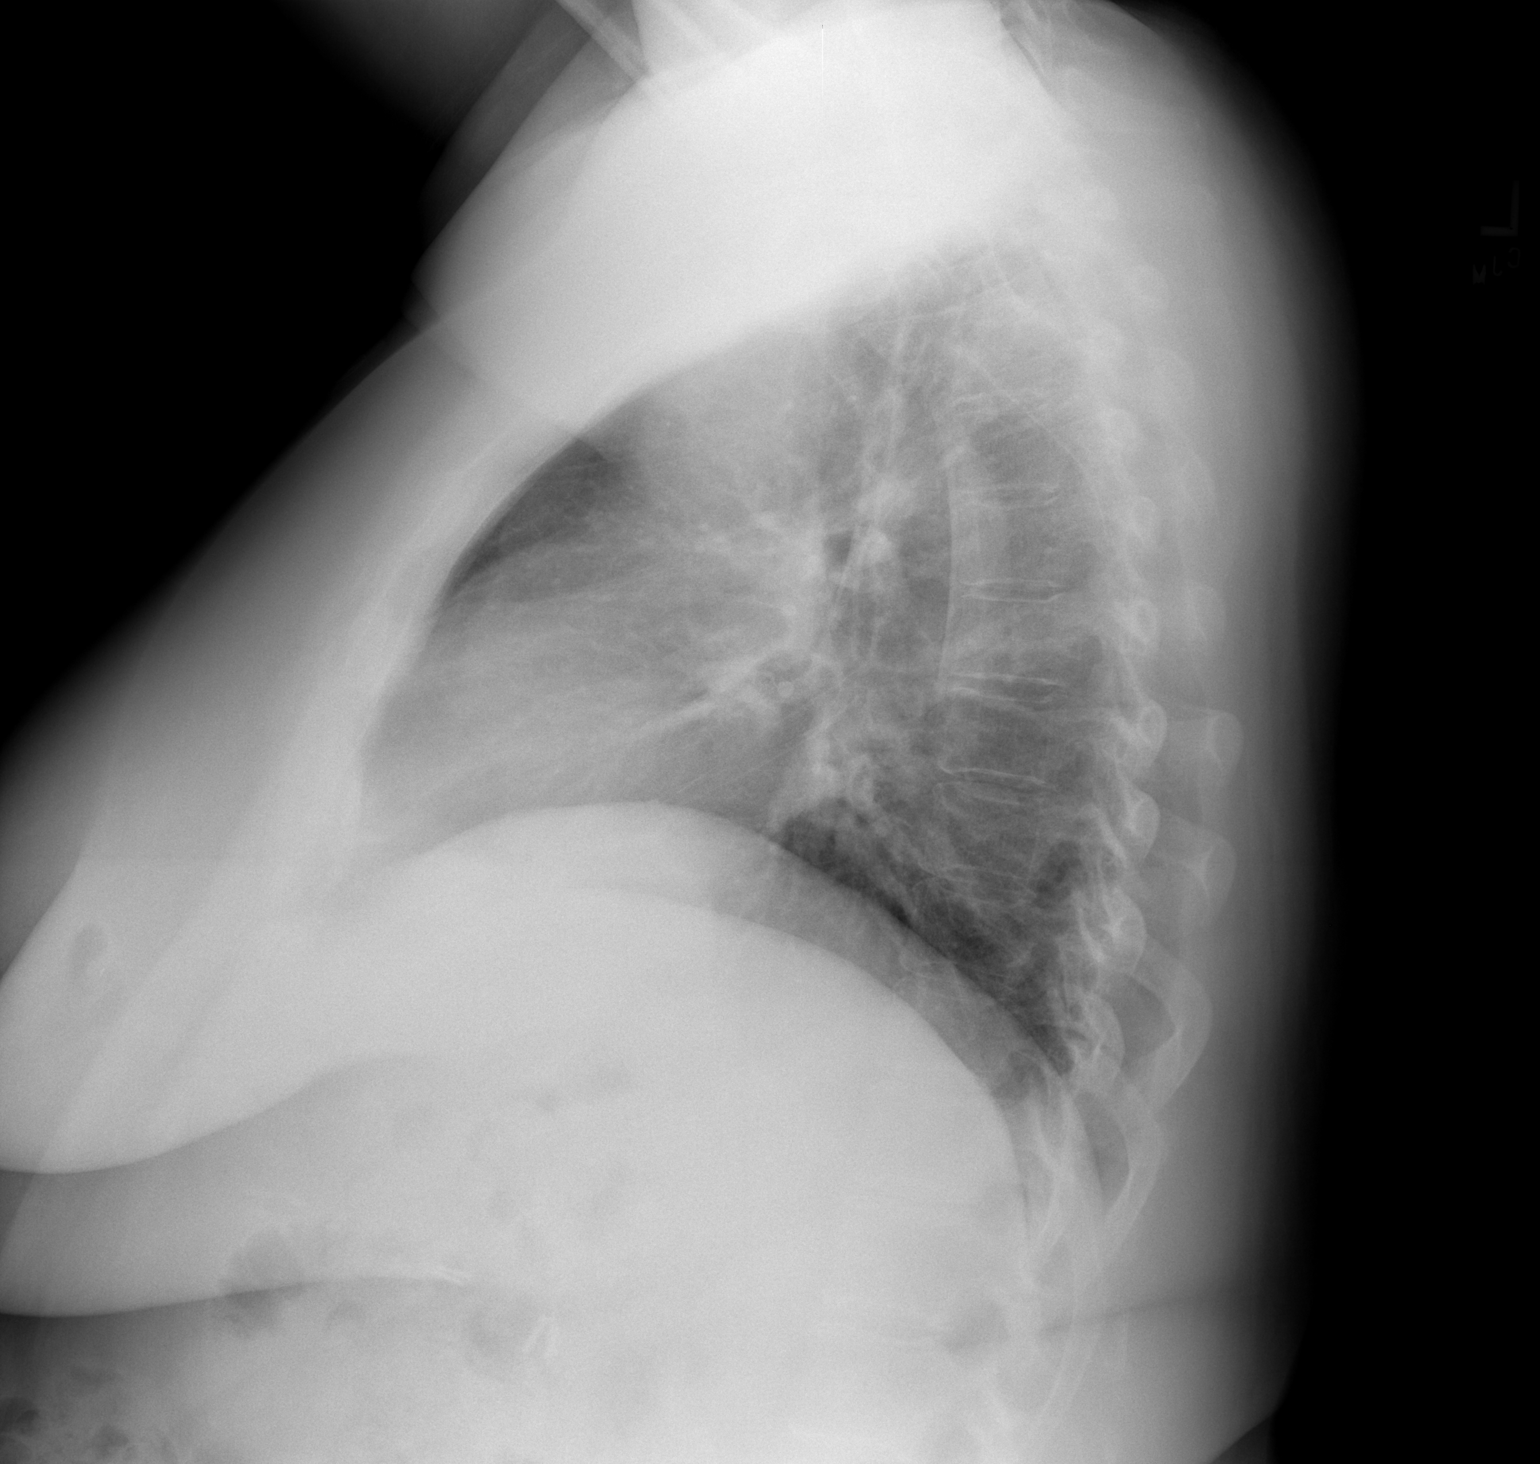

[2 of 2 positions shown; findings below may reference images not displayed]

FINDINGS: The heart size and mediastinal contours are within normal
limits.  Both lungs are clear.  The visualized skeletal structures
are unremarkable.
IMPRESSION: No active disease.

## 2013-03-28 ENCOUNTER — Ambulatory Visit (INDEPENDENT_AMBULATORY_CARE_PROVIDER_SITE_OTHER): Payer: BC Managed Care – PPO | Admitting: Physician Assistant

## 2013-03-28 VITALS — BP 132/85 | HR 81 | Temp 98.3°F | Resp 18 | Wt 200.0 lb

## 2013-03-28 DIAGNOSIS — E119 Type 2 diabetes mellitus without complications: Secondary | ICD-10-CM

## 2013-03-28 LAB — POCT GLYCOSYLATED HEMOGLOBIN (HGB A1C): Hemoglobin A1C: 8.8

## 2013-03-28 NOTE — Progress Notes (Signed)
   Subjective:    Patient ID: Michelle Torres, female    DOB: [birth year], 53 y.o.   MRN: [id number]  HPI   Michelle Torres is a pleasant 53 yr old female here request a work note for this past week.  Pt reports GI illness with vomiting and diarrhea 03/24/13 - 03/27/13.  She is recovering nicely.  Stools now more formed, and no vomiting in nearly 24 hours.  She would like a note excusing her from work these days.  She planned to come into clinic during the week, but felt so bad that she couldn't make it.  Pt would also like her A1C checked, last 8.6 in Oct 2014.  She knows that acute illness can cause sugars to run higher.  Also admits that her diet and exercise are not optimal.  She walks for 15 minutes at a time.  Trying to walk at work when possible as she has a sedentary job.     Review of Systems  Constitutional: Negative for fever and chills.  Respiratory: Negative.   Cardiovascular: Negative.   Gastrointestinal: Positive for vomiting (now resolved) and diarrhea (now resolved).  Musculoskeletal: Negative.   Skin: Negative.        Objective:   Physical Exam  Vitals reviewed. Constitutional: She is oriented to person, place, and time. She appears well-developed and well-nourished. No distress.  HENT:  Head: Normocephalic and atraumatic.  Eyes: Conjunctivae are normal. No scleral icterus.  Cardiovascular: Normal rate, regular rhythm and normal heart sounds.   Pulmonary/Chest: Effort normal and breath sounds normal. She has no wheezes. She has no rales.  Abdominal: Soft. Bowel sounds are normal. There is no tenderness.  Neurological: She is alert and oriented to person, place, and time.  Skin: Skin is warm and dry.  Psychiatric: She has a normal mood and affect. Her behavior is normal.     Results for orders placed in visit on 03/28/13  POCT GLYCOSYLATED HEMOGLOBIN (HGB A1C)      Result Value Range   Hemoglobin A1C 8.8         Assessment & Plan:  Type 2 diabetes mellitus - Plan:  POCT glycosylated hemoglobin (Hb A1C), Amb ref to Medical Nutrition Therapy-MNT   Michelle Torres is a pleasant 53 yr old female here for A1C check.  A1C is 8.8, up from 8.6 in Oct 2014.  Pt was disappointed to hear this.  She takes her medications faithfully.  She is hoping to avoid additional medications.  She knows she needs to optimize diet and exercise.  We discussed specifically increasing the intensity of her exercise.  Will refer to MNT to discuss meal planning, healthy food choices.  I have provided her with a work note for the days she had to miss due to illness last week.  Alonza Smoker MHS, PA-C [facility] 2/8/20158:47 AM

## 2013-06-12 ENCOUNTER — Ambulatory Visit (INDEPENDENT_AMBULATORY_CARE_PROVIDER_SITE_OTHER): Payer: BC Managed Care – PPO | Admitting: Physician Assistant

## 2013-06-12 VITALS — BP 128/80 | HR 95 | Temp 98.4°F | Resp 17 | Ht 60.0 in | Wt 205.0 lb

## 2013-06-12 DIAGNOSIS — J329 Chronic sinusitis, unspecified: Secondary | ICD-10-CM

## 2013-06-12 MED ORDER — BENZONATATE 100 MG PO CAPS: 100.0000 mg | ORAL_CAPSULE | Freq: Three times a day (TID) | ORAL | Status: DC | PRN

## 2013-06-12 MED ORDER — AMOXICILLIN 875 MG PO TABS: 1750.0000 mg | ORAL_TABLET | Freq: Two times a day (BID) | ORAL | Status: DC

## 2013-06-12 NOTE — Progress Notes (Signed)
Subjective:    Patient ID: Michelle Torres, female    DOB: 01/20/1961, 53 y.o.   MRN: [id number]  Sinusitis Associated symptoms include congestion, coughing, sinus pressure and a sore throat. Pertinent negatives include no chills, headaches or shortness of breath.  Cough Associated symptoms include postnasal drip and a sore throat. Pertinent negatives include no chills, fever, headaches, rash, rhinorrhea, shortness of breath or wheezing.  Sore Throat  Associated symptoms include congestion and coughing. Pertinent negatives include no headaches or shortness of breath.     53y.o female with DM and Bipolar type II presents with 5 days of itchy eyes without drainage, congestion, cough, sinus pressure, and sore throat.  Cough is productive of yellow mucus.  Took zyrtec and delsym without much relief.  Pt states these are not allergies bc she never had allergies.    Also feeling slightly lightheaded like her head is in a fog.  Nausea and vomiting associated with the migraine only on Monday.  Multiple coworkers in office having similar URI symptoms before her.  Denies fever, SOB, wheezing, urinary changes, neck stiffness.  Glucose 143 this morning fasting.   Out of work for 7,8, 20th of May due to migraines and high glucose.  Would like FMLA documentation for those days.  Pt did not seek medical care during those days.  Pt would also like work note for today's visit.   Review of Systems  Constitutional: Positive for appetite change. Negative for fever and chills.  HENT: Positive for congestion, postnasal drip, sinus pressure and sore throat. Negative for rhinorrhea.   Eyes: Positive for itching.  Respiratory: Positive for cough. Negative for shortness of breath and wheezing.   Cardiovascular: Negative.   Gastrointestinal: Negative.   Endocrine: Negative.   Genitourinary: Negative.   Musculoskeletal: Negative.   Skin: Negative for color change and rash.  Allergic/Immunologic: Negative.     Neurological: Positive for light-headedness. Negative for speech difficulty, weakness, numbness and headaches.  Hematological: Negative.        Objective:   Physical Exam  Constitutional: She is oriented to person, place, and time. She appears well-developed and well-nourished. No distress.  BP 128/80  Pulse 95  Temp(Src) 98.4 F (36.9 C) (Oral)  Resp 17  Ht 5' (1.524 m)  Wt 205 lb (92.987 kg)  BMI 40.04 kg/m2  SpO2 95%   HENT:  Head: Normocephalic.  Right Ear: External ear normal.  Left Ear: External ear normal.  Nose: Nose normal.  Mouth/Throat: No oropharyngeal exudate.  Eyes: Conjunctivae and EOM are normal. Pupils are equal, round, and reactive to light.  Neck: Normal range of motion. No tracheal deviation present.  Cardiovascular: Normal rate, regular rhythm, normal heart sounds and intact distal pulses.  Exam reveals no gallop and no friction rub.   No murmur heard. Pulmonary/Chest: Effort normal and breath sounds normal. No respiratory distress. She has no wheezes.  Abdominal: Soft. There is tenderness (upper quadrants bilat).  Lymphadenopathy:    She has no cervical adenopathy.  Neurological: She is alert and oriented to person, place, and time.  Skin: Skin is warm and dry. No rash noted.  Psychiatric: She has a normal mood and affect. Her behavior is normal.          Assessment & Plan:   1. Sinusitis Pt states she is taking daily zyrtec and using flonase and atrovant nasal sprays daily without any relief.  Added amoxicillin for probable sinus infection.  Prescribed tessalon to control cough and sore throat. -  amoxicillin (AMOXIL) 875 MG tablet; Take 2 tablets (1,750 mg total) by mouth 2 (two) times daily.  Dispense: 20 tablet; Refill: 0 - benzonatate (TESSALON) 100 MG capsule; Take 1-2 capsules (100-200 mg total) by mouth 3 (three) times daily as needed for cough.  Dispense: 40 capsule; Refill: 0  Pt will follow up next month for DM check.

## 2013-06-12 NOTE — Patient Instructions (Signed)
Get plenty of rest and drink at least 64 ounces of water daily. Continue the Zyrtec, Flonase and Atrovent nasal sprays.

## 2013-06-12 NOTE — Progress Notes (Signed)
I have examined this patient along with the student and agree.  

## 2013-07-14 ENCOUNTER — Ambulatory Visit (INDEPENDENT_AMBULATORY_CARE_PROVIDER_SITE_OTHER): Payer: BC Managed Care – PPO | Admitting: Family Medicine

## 2013-07-14 VITALS — BP 106/62 | HR 62 | Temp 98.1°F | Resp 16 | Ht 60.0 in | Wt 203.6 lb

## 2013-07-14 DIAGNOSIS — E119 Type 2 diabetes mellitus without complications: Secondary | ICD-10-CM

## 2013-07-14 DIAGNOSIS — R21 Rash and other nonspecific skin eruption: Secondary | ICD-10-CM

## 2013-07-14 DIAGNOSIS — Z23 Encounter for immunization: Secondary | ICD-10-CM

## 2013-07-14 DIAGNOSIS — L559 Sunburn, unspecified: Secondary | ICD-10-CM

## 2013-07-14 MED ORDER — "NEEDLE (DISP) 30G X 3/4"" MISC": Status: DC

## 2013-07-14 MED ORDER — SILVER SULFADIAZINE 1 % EX CREA: 1.0000 "application " | TOPICAL_CREAM | Freq: Every day | CUTANEOUS | Status: DC

## 2013-07-14 NOTE — Progress Notes (Signed)
Chief Complaint:  Chief Complaint  Patient presents with  . Sunburn    Sun Posioning - Upper/lower extremities x 4 dys  . Medication Refill    Lancets    HPI: Michelle Torres is a 53 y.o. female who is here for  4 day history of sunburn on her chest and back and her left foot, and also thigh and it hurts, itches. She did use sunscreen bt was sitting outside and for her nephew's graduation from med school and also went to the beach. She  Has some areas where there is crusting and scabing. She is diabetic. She took her sugar one week ago and was 160.   Past Medical History  Diagnosis Date  . Diabetes mellitus   . Bipolar disorder   . High cholesterol   . Fatty liver disease, nonalcoholic   . Depression   . GERD (gastroesophageal reflux disease)   . Anxiety   . Hypertension    Past Surgical History  Procedure Laterality Date  . Laparoscopic cholecystectomy  1993  . Dilation and curettage of uterus    . Ercp  03/03/2011    Procedure: ENDOSCOPIC RETROGRADE CHOLANGIOPANCREATOGRAPHY (ERCP);  Surgeon: Lafayette Dragon, MD;  Location: [facility] ENDOSCOPY;  Service: Endoscopy;  Laterality: N/A;   History   Social History  . Marital Status: Married    Spouse Name: [name] Dinunzio    Number of Children: 2  . Years of Education: 14   Occupational History  . PROCESSING ASSISTANT    Social History Main Topics  . Smoking status: Never Smoker   . Smokeless tobacco: Never Used  . Alcohol Use: 0.5 oz/week    1 drink(s) per week     Comment: once every couple months  . Drug Use: No  . Sexual Activity: Yes    Partners: Male    Birth Control/ Protection: Post-menopausal   Other Topics Concern  . None   Social History Narrative   Lives with her husband.  Their daughter, is in college in [place], and plans to transfer to [employer].  Her son is at [employer] and lives with them. Her husband has a child from a previous relationship who lives in [place].   Family History  Problem Relation Age of  Onset  . Mental illness Father     Manic Depression  . Alcohol abuse Father   . Stroke Father   . Heart disease Father   . Arthritis Son   . Depression Son   . Breast cancer Maternal Grandmother   . Breast cancer Paternal Grandmother    Allergies  Allergen Reactions  . Risperidone And Related Other (See Comments)    liver damage   Prior to Admission medications   Medication Sig Start Date End Date Taking? Authorizing Provider  Calcium Carbonate-Vitamin D (OSCAL 500/200 D-3 PO) Take 2 tablets by mouth daily.    Yes Historical Provider, MD  cetirizine (ZYRTEC) 10 MG tablet Take 10 mg by mouth daily.   Yes Historical Provider, MD  clonazePAM (KLONOPIN) 1 MG tablet Take 4 mg by mouth at bedtime. May take 1/2 to 1 tablet during the day as needed   Yes Historical Provider, MD  divalproex (DEPAKOTE ER) 500 MG 24 hr tablet Take 1,500 mg by mouth daily.    Yes Historical Provider, MD  eletriptan (RELPAX) 40 MG tablet Take 40 mg by mouth as needed for migraine. One tablet by mouth at onset of headache. May repeat in 2 hours if  headache persists or recurs.   Yes Historical Provider, MD  fluticasone (FLONASE) 50 MCG/ACT nasal spray Place 2 sprays into the nose as needed for rhinitis.   Yes Historical Provider, MD  gabapentin (NEURONTIN) 600 MG tablet Take 600 mg by mouth at bedtime.     Yes Historical Provider, MD  insulin glargine (LANTUS) 100 UNIT/ML injection Inject 0.5 mLs (50 Units total) into the skin at bedtime. Increase dose by 2 units every morning until fasting glucose is below 140 mg/dl. start at 40 units every evening and increase as instructed 09/18/12  Yes Chelle S Jeffery, PA-C  ipratropium (ATROVENT) 0.03 % nasal spray Place 2 sprays into the nose 2 (two) times daily. 10/25/12  Yes Chelle S Jeffery, PA-C  lamoTRIgine (LAMICTAL) 100 MG tablet Take 200 mg by mouth every morning.    Yes Historical Provider, MD  metFORMIN (GLUCOPHAGE) 1000 MG tablet Take 1,000 mg by mouth 2 (two) times daily  with a meal.   Yes Historical Provider, MD  naproxen (NAPROSYN) 500 MG tablet Take 500 mg by mouth 2 (two) times daily as needed (for pain).    Yes Historical Provider, MD  OXcarbazepine (TRILEPTAL) 150 MG tablet Take 150 mg by mouth at bedtime.    Yes Historical Provider, MD  polyethylene glycol (MIRALAX / GLYCOLAX) packet Take 17 g by mouth as needed for mild constipation.  09/18/12  Yes Ezequiel Essex, MD  pregabalin (LYRICA) 75 MG capsule Take 75 mg by mouth every morning.    Yes Historical Provider, MD  topiramate (TOPAMAX) 100 MG tablet Take 100 mg by mouth at bedtime.    Yes Historical Provider, MD  amoxicillin (AMOXIL) 875 MG tablet Take 2 tablets (1,750 mg total) by mouth 2 (two) times daily. 06/12/13   Chelle Janalee Dane, PA-C  aspirin 325 MG tablet Take 325 mg by mouth daily.     Historical Provider, MD  benzonatate (TESSALON) 100 MG capsule Take 1-2 capsules (100-200 mg total) by mouth 3 (three) times daily as needed for cough. 06/12/13   Chelle Janalee Dane, PA-C  dextromethorphan (DELSYM) 30 MG/5ML liquid Take by mouth as needed for cough.    Historical Provider, MD  promethazine (PHENERGAN) 25 MG tablet Take 25 mg by mouth every 6 (six) hours as needed for nausea or vomiting.    Historical Provider, MD     ROS: The patient denies fevers,  night sweats, unintentional weight loss, chest pain, palpitations, wheezing, dyspnea on exertion, nausea, vomiting, abdominal pain, dysuria, hematuria, melena, numbness, weakness, or tingling.   All other systems have been reviewed and were otherwise negative with the exception of those mentioned in the HPI and as above.    PHYSICAL EXAM: Filed Vitals:   07/14/13 0801  BP: 106/62  Pulse: 62  Temp: 98.1 F (36.7 C)  Resp: 16   Filed Vitals:   07/14/13 0801  Height: 5' (1.524 m)  Weight: 203 lb 9.6 oz (92.352 kg)   Body mass index is 39.76 kg/(m^2).  General: Alert, no acute distress HEENT:  Normocephalic, atraumatic, oropharynx patent.  EOMI, PERRLA Cardiovascular:  Regular rate and rhythm, no rubs murmurs or gallops.  No Carotid bruits, radial pulse intact. No pedal edema.  Respiratory: Clear to auscultation bilaterally.  No wheezes, rales, or rhonchi.  No cyanosis, no use of accessory musculature GI: No organomegaly, abdomen is soft and non-tender, positive bowel sounds.  No masses. Skin: + sun rashes. Neurologic: Facial musculature symmetric. Psychiatric: Patient is appropriate throughout our interaction. Lymphatic: No cervical  lymphadenopathy Musculoskeletal: Gait intact.   LABS: Results for orders placed in visit on 03/28/13  POCT GLYCOSYLATED HEMOGLOBIN (HGB A1C)      Result Value Ref Range   Hemoglobin A1C 8.8       EKG/XRAY:   Primary read interpreted by Dr. Marin Comment at [facility].   ASSESSMENT/PLAN: Encounter Diagnoses  Name Primary?  . Sunburn Yes  . Rash and nonspecific skin eruption   . Diabetes   . Need for vaccine for TD (tetanus-diphtheria)    TDap given Rx silividene Refer to endo for DM, poorly controlled for last 2 years.  Refille lancets but may have to call back with correct rx. Take phenergan at home and relpax for HA, push fluids.  F/u prn  Gross sideeffects, risk and benefits, and alternatives of medications d/w patient. Patient is aware that all medications have potential sideeffects and we are unable to predict every sideeffect or drug-drug interaction that may occur.  Glenford Bayley, DO 07/14/2013 9:19 AM

## 2013-07-21 ENCOUNTER — Telehealth: Payer: Self-pay

## 2013-07-21 NOTE — Telephone Encounter (Signed)
Pt was told to let Chelle know when she stays home sick with a migraine , she didn't feel it was emergent, took her meds

## 2013-07-21 NOTE — Telephone Encounter (Signed)
Noted  

## 2013-07-22 ENCOUNTER — Other Ambulatory Visit: Payer: Self-pay | Admitting: Physician Assistant

## 2013-07-28 ENCOUNTER — Telehealth: Payer: Self-pay

## 2013-07-28 NOTE — Telephone Encounter (Signed)
Dr Legrand Como altheimer - [facility]  Can see patient next week with a referral.    [phone]

## 2013-07-31 NOTE — Telephone Encounter (Signed)
Spoke with patient, wants referral to Dr Altheimer, he can see her sooner than Balan. Will try to arrange for that.

## 2013-08-04 ENCOUNTER — Telehealth: Payer: Self-pay | Admitting: Radiology

## 2013-08-04 NOTE — Telephone Encounter (Signed)
I have called Dr Altheimer and faxed information to his office, to try to get patient an appointment, I have not heard back from the office, have you?

## 2013-08-05 NOTE — Telephone Encounter (Signed)
Faxed records to dr altheimer and checked status today and altheimer is still reviewing records and they will call our office to give appt date and time

## 2013-08-12 ENCOUNTER — Telehealth: Payer: Self-pay

## 2013-08-12 NOTE — Telephone Encounter (Signed)
FOR CHELLE: PT WANTED YOU TO KNOW SHE MISSED WORK 2 DAYS AND HER DIABETICS IS UP AND HER LEFT EYE WAS SHOWING DOUBLE VISION BUT IT HAS CLEARED UP NOW PLEASE CALL [phone]

## 2013-08-13 NOTE — Telephone Encounter (Signed)
Spoke to patient.  She states her blood sugar has been up to 225.  She has off and on been having trouble with her left eye.  She said she has been having blurry vision and she left work and rested with an ice pack and it improved.  If readings are not down significantly by tonight she will come in this evening to see Chelle.  She goes tomorrow morning for blood work for the endocronologist and then her appt is next week.  [facility], Dr. Elyse Hsu.  She states that her sugar is at 215 today. She said she wants Chelle to have this information.

## 2013-08-14 ENCOUNTER — Ambulatory Visit (INDEPENDENT_AMBULATORY_CARE_PROVIDER_SITE_OTHER): Payer: BC Managed Care – PPO | Admitting: Physician Assistant

## 2013-08-14 VITALS — BP 96/72 | HR 71 | Temp 98.4°F | Resp 16 | Ht 60.25 in | Wt 201.6 lb

## 2013-08-14 DIAGNOSIS — R110 Nausea: Secondary | ICD-10-CM

## 2013-08-14 DIAGNOSIS — G43101 Migraine with aura, not intractable, with status migrainosus: Secondary | ICD-10-CM

## 2013-08-14 DIAGNOSIS — G43909 Migraine, unspecified, not intractable, without status migrainosus: Secondary | ICD-10-CM | POA: Insufficient documentation

## 2013-08-14 DIAGNOSIS — R197 Diarrhea, unspecified: Secondary | ICD-10-CM

## 2013-08-14 DIAGNOSIS — G43111 Migraine with aura, intractable, with status migrainosus: Secondary | ICD-10-CM

## 2013-08-14 MED ORDER — PROMETHAZINE HCL 25 MG/ML IJ SOLN
25.0000 mg | Freq: Once | INTRAMUSCULAR | Status: AC
  Administered 2013-08-14: 25 mg via INTRAMUSCULAR

## 2013-08-14 MED ORDER — KETOROLAC TROMETHAMINE 30 MG/ML IJ SOLN
30.0000 mg | Freq: Once | INTRAMUSCULAR | Status: AC
  Administered 2013-08-14: 30 mg via INTRAMUSCULAR

## 2013-08-14 NOTE — Patient Instructions (Signed)
Stay hydrated. Contact Dr. Albertine Patricia about the possibility that the combination of your medications may be contributing to the episodic diarrhea you've had.

## 2013-08-14 NOTE — Telephone Encounter (Signed)
Patient was seen in the office before I received this message.

## 2013-08-14 NOTE — Progress Notes (Signed)
Subjective:    Patient ID: Michelle Torres, female    DOB: 1960/12/07, 53 y.o.   MRN: [id number]   PCP: [name], MD  Chief Complaint  Patient presents with  . Migraine    off and on since Monday  . Nausea    since Monday    Medications, allergies, past medical history, surgical history, family history, social history and problem list reviewed and updated.  Patient Active Problem List   Diagnosis Date Noted  . Migraine 08/14/2013  . Bipolar 2 disorder 03/05/2011  . Diabetes mellitus 03/05/2011  . Fatty infiltration of liver 03/02/2011    Prior to Admission medications   Medication Sig Start Date End Date Taking? Authorizing Provider  aspirin 325 MG tablet Take 325 mg by mouth daily.    Yes Historical Provider, MD  Calcium Carbonate-Vitamin D (OSCAL 500/200 D-3 PO) Take 2 tablets by mouth daily.    Yes Historical Provider, MD  cetirizine (ZYRTEC) 10 MG tablet Take 10 mg by mouth daily.   Yes Historical Provider, MD  clonazePAM (KLONOPIN) 1 MG tablet Take 4 mg by mouth at bedtime. May take 1/2 to 1 tablet during the day as needed   Yes Historical Provider, MD  divalproex (DEPAKOTE ER) 500 MG 24 hr tablet Take 1,500 mg by mouth daily.    Yes Historical Provider, MD  eletriptan (RELPAX) 40 MG tablet Take 40 mg by mouth as needed for migraine. One tablet by mouth at onset of headache. May repeat in 2 hours if headache persists or recurs.   Yes Historical Provider, MD  fluticasone (FLONASE) 50 MCG/ACT nasal spray Place 2 sprays into the nose as needed for rhinitis.   Yes Historical Provider, MD  gabapentin (NEURONTIN) 600 MG tablet Take 600 mg by mouth at bedtime.     Yes Historical Provider, MD  insulin glargine (LANTUS) 100 UNIT/ML injection Inject 0.5 mLs (50 Units total) into the skin at bedtime. Increase dose by 2 units every morning until fasting glucose is below 140 mg/dl. start at 40 units every evening and increase as instructed 09/18/12  Yes Chelle S Jeffery, PA-C    ipratropium (ATROVENT) 0.03 % nasal spray Place 2 sprays into the nose 2 (two) times daily. 10/25/12  Yes Chelle S Jeffery, PA-C  lamoTRIgine (LAMICTAL) 100 MG tablet Take 200 mg by mouth every morning.    Yes Historical Provider, MD  metFORMIN (GLUCOPHAGE) 1000 MG tablet Take 1,000 mg by mouth 2 (two) times daily with a meal.   Yes Historical Provider, MD  naproxen (NAPROSYN) 500 MG tablet Take 500 mg by mouth 2 (two) times daily as needed (for pain).    Yes Historical Provider, MD  NEEDLE, DISP, 30 G 30G X 3/4" MISC Use as directed, daily blood sugars ( please dispense what patient had previously) 07/14/13  Yes Thao P Le, DO  OXcarbazepine (TRILEPTAL) 150 MG tablet Take 150 mg by mouth at bedtime.    Yes Historical Provider, MD  pregabalin (LYRICA) 75 MG capsule Take 75 mg by mouth every morning.    Yes Historical Provider, MD  promethazine (PHENERGAN) 25 MG tablet Take 25 mg by mouth every 6 (six) hours as needed for nausea or vomiting.   Yes Historical Provider, MD  topiramate (TOPAMAX) 100 MG tablet Take 100 mg by mouth at bedtime.    Yes Historical Provider, MD    HPI  Migraine HA began early this morning.  Took a Relpax and iced her head, as usual. Gave  blood this morning at Dr. Altheimer's office in preparation for her visit next week.. Then nausea got worse, no vomiting so far, associated with dizziness and fatigue, and came here for evaluation.  She's had intermittent HA, nausea, dizziness, blurry vision and elevated glucose (>200) since 08/10/2013. She had to leave work early on 6/23, and wasn't able to go 6/25. Blood sugar this morning was better, <200 (?160 or 180, she can't remember).  She continues to see her psychiatrist, Dr. Albertine Patricia, and has been seeing a therapist, Arvil Chaco for several months.  Her 3rd grandchild, Blima Singer, was born with multiple medical problems and is in the NICU at [facility].  He'll be having multiple surgeries in the future.  She notes an  increase in difficulty mentally processing information over the past 1-2 weeks, maybe 3 weeks. Hasn't discussed this with Dr. Albertine Patricia. Mood much more labile, crying "at the drop of a hat."   Review of Systems     Objective:   Physical Exam  Constitutional: She is oriented to person, place, and time. She appears well-developed and well-nourished. No distress.  BP 96/72  Pulse 71  Temp(Src) 98.4 F (36.9 C) (Oral)  Resp 16  Ht 5' 0.25" (1.53 m)  Wt 201 lb 9.6 oz (91.445 kg)  BMI 39.06 kg/m2  SpO2 99% Accompanied by her husband, [name]. Wearing sunglasses and holding an emesis basin.   HENT:  Head: Normocephalic and atraumatic.  Right Ear: External ear normal.  Left Ear: External ear normal.  Nose: Nose normal.  Mouth/Throat: Oropharynx is clear and moist. No oropharyngeal exudate.  Eyes: Conjunctivae are normal. No scleral icterus.  Neck: Neck supple. No thyromegaly present.  Cardiovascular: Normal rate, regular rhythm and normal heart sounds.   Pulmonary/Chest: Effort normal and breath sounds normal.  Lymphadenopathy:    She has no cervical adenopathy.  Neurological: She is alert and oriented to person, place, and time. She has normal strength. No cranial nerve deficit or sensory deficit.  Skin: Skin is warm and dry.  Psychiatric: She has a normal mood and affect. Her behavior is normal.          Assessment & Plan:  1. Migraine with aura and with status migrainosus, not intractable 2. Nausea alone Treat this typical migraine. Continue ice and rest. Likely triggered by dehydration from recent diarrheal illness. - ketorolac (TORADOL) 30 MG/ML injection 30 mg; Inject 1 mL (30 mg total) into the muscle once. - promethazine (PHENERGAN) injection 25 mg; Inject 1 mL (25 mg total) into the muscle once.  3. Diarrhea Recurrent.  Normal colonoscopy last year.  Hydrate, OTC anti-diarrheal meds PRN.  Increase fiber in diet. Discuss symptoms with Dr. Albertine Patricia, as several of her  medications can cause diarrhea and may be at least partially responsible for these episodes.  Follow-up with Dr. Elyse Hsu next week as planned re: DM.  I elect not to reduce/stop the metformin, as she has been on this for years (?2009) without adverse effects, and her A1C remains above 8%.   Fara Chute, PA-C Physician Assistant-Certified [facility]

## 2013-08-19 ENCOUNTER — Encounter: Payer: Self-pay | Admitting: Physician Assistant

## 2013-08-21 ENCOUNTER — Encounter: Payer: Self-pay | Admitting: Physician Assistant

## 2013-08-27 ENCOUNTER — Telehealth: Payer: Self-pay | Admitting: Physician Assistant

## 2013-08-27 ENCOUNTER — Telehealth: Payer: Self-pay

## 2013-08-27 DIAGNOSIS — E559 Vitamin D deficiency, unspecified: Secondary | ICD-10-CM | POA: Insufficient documentation

## 2013-08-27 DIAGNOSIS — E119 Type 2 diabetes mellitus without complications: Secondary | ICD-10-CM

## 2013-08-27 DIAGNOSIS — E039 Hypothyroidism, unspecified: Secondary | ICD-10-CM | POA: Insufficient documentation

## 2013-08-27 DIAGNOSIS — G43901 Migraine, unspecified, not intractable, with status migrainosus: Secondary | ICD-10-CM

## 2013-08-27 MED ORDER — LIDOCAINE HCL 4 % EX SOLN: CUTANEOUS | Status: DC

## 2013-08-27 NOTE — Telephone Encounter (Signed)
Patient states that Harrison Mons left her an excellent message on MyChart about her statin however she has some other questions. First, she wants to know if a letter will be mailed to her with copies of her lab results so that she can give them to another physician that she sees. Second, patient wants Chelle to know that she was out of work yesterday (Wednesday) and today (Thursday). Please call her at home at [phone].

## 2013-08-27 NOTE — Telephone Encounter (Signed)
Saw Dr. Elyse Hsu. Next visit is 7/28 (labs 7/24). Metformin changed to XR 1000 mg BID with meals. Started on Trulicity 75 mg injected weekly. So far she's tolerating them well. Expects to have Trulicity dose increased to 1.5mg , and addition of another oral agent, with plan to reduce insulin dose (already reduced to 40 units). Gave her a new meter, and she's checking regularly, seeing 70-80 point rise mid-day when fasting. She and her husband are very pleased with her experience there.  In addition, their grandchild, Blima Singer, died shortly after birth (born premature and with significant anomalies). Having increased migraine frequency, but cannot miss any more work. She continues to see Arvil Chaco, and has a grief counselor to see as well.  Requests something "stronger" for HA when she's at work.  She'd like to try lidocaine nasal drops.  Meds ordered this encounter  Medications  . lidocaine (XYLOCAINE) 4 % external solution    Sig: Instill .25 ml into each nostril as directed prn Migraine headache    Dispense:  50 mL    Refill:  0    Order Specific Question:  Supervising Provider    Answer:  DOOLITTLE, ROBERT P [[id number]]

## 2013-08-28 ENCOUNTER — Ambulatory Visit (INDEPENDENT_AMBULATORY_CARE_PROVIDER_SITE_OTHER): Payer: BC Managed Care – PPO | Admitting: Physician Assistant

## 2013-08-28 VITALS — BP 124/82 | HR 74 | Temp 97.2°F | Resp 18 | Ht 60.0 in | Wt 207.0 lb

## 2013-08-28 DIAGNOSIS — G43111 Migraine with aura, intractable, with status migrainosus: Secondary | ICD-10-CM

## 2013-08-28 DIAGNOSIS — G43101 Migraine with aura, not intractable, with status migrainosus: Secondary | ICD-10-CM

## 2013-08-28 MED ORDER — KETOROLAC TROMETHAMINE 30 MG/ML IJ SOLN
30.0000 mg | Freq: Once | INTRAMUSCULAR | Status: AC
  Administered 2013-08-28: 30 mg via INTRAMUSCULAR

## 2013-08-28 MED ORDER — PROMETHAZINE HCL 25 MG/ML IJ SOLN
25.0000 mg | Freq: Once | INTRAMUSCULAR | Status: AC
  Administered 2013-08-28: 25 mg via INTRAMUSCULAR

## 2013-08-28 NOTE — Progress Notes (Signed)
Subjective:    Patient ID: Michelle Torres, female    DOB: Jan 25, 1961, 53 y.o.   MRN: [id number]   PCP: [name], MD  Chief Complaint  Patient presents with  . Migraine    x 3 days    Medications, allergies, past medical history, surgical history, family history, social history and problem list reviewed and updated.  Patient Active Problem List   Diagnosis Date Noted  . Vitamin D deficiency 08/27/2013  . Hypothyroidism 08/27/2013  . Migraine 08/14/2013  . Bipolar 2 disorder 03/05/2011  . Diabetes mellitus 03/05/2011  . Fatty infiltration of liver 03/02/2011    Prior to Admission medications   Medication Sig Start Date End Date Taking? Authorizing Provider  aspirin 325 MG tablet Take 325 mg by mouth daily.    Yes Historical Provider, MD  Calcium Carbonate-Vitamin D (OSCAL 500/200 D-3 PO) Take 2 tablets by mouth daily.    Yes Historical Provider, MD  cetirizine (ZYRTEC) 10 MG tablet Take 10 mg by mouth daily.   Yes Historical Provider, MD  clonazePAM (KLONOPIN) 1 MG tablet Take 4 mg by mouth at bedtime. May take 1/2 to 1 tablet during the day as needed   Yes Historical Provider, MD  divalproex (DEPAKOTE ER) 500 MG 24 hr tablet Take 1,500 mg by mouth daily.    Yes Historical Provider, MD  Dulaglutide (TRULICITY) 4.59 XH/7.4FS SOPN Inject into the skin.   Yes Historical Provider, MD  eletriptan (RELPAX) 40 MG tablet Take 40 mg by mouth as needed for migraine. One tablet by mouth at onset of headache. May repeat in 2 hours if headache persists or recurs.   Yes Historical Provider, MD  fluticasone (FLONASE) 50 MCG/ACT nasal spray Place 2 sprays into the nose as needed for rhinitis.   Yes Historical Provider, MD  gabapentin (NEURONTIN) 600 MG tablet Take 600 mg by mouth at bedtime.     Yes Historical Provider, MD  insulin glargine (LANTUS) 100 UNIT/ML injection Inject 0.5 mLs (50 Units total) into the skin at bedtime. Increase dose by 2 units every morning until fasting glucose is  below 140 mg/dl. start at 40 units every evening and increase as instructed 09/18/12  Yes Dixie Jafri S Devany Aja, PA-C  ipratropium (ATROVENT) 0.03 % nasal spray Place 2 sprays into the nose 2 (two) times daily. 10/25/12  Yes Kennette Cuthrell S Kaiya Boatman, PA-C  lamoTRIgine (LAMICTAL) 100 MG tablet Take 200 mg by mouth every morning.    Yes Historical Provider, MD  lidocaine (XYLOCAINE) 4 % external solution Instill .25 ml into each nostril as directed prn Migraine headache 08/27/13  Yes Tahiry Spicer S Karry Causer, PA-C  metFORMIN (GLUCOPHAGE) 500 MG tablet Take 500 mg by mouth 2 (two) times daily with a meal.   Yes Historical Provider, MD  naproxen (NAPROSYN) 500 MG tablet Take 500 mg by mouth 2 (two) times daily as needed (for pain).    Yes Historical Provider, MD  NEEDLE, DISP, 30 G 30G X 3/4" MISC Use as directed, daily blood sugars ( please dispense what patient had previously) 07/14/13  Yes Thao P Le, DO  OXcarbazepine (TRILEPTAL) 150 MG tablet Take 150 mg by mouth at bedtime.    Yes Historical Provider, MD  pregabalin (LYRICA) 75 MG capsule Take 75 mg by mouth every morning.    Yes Historical Provider, MD  promethazine (PHENERGAN) 25 MG tablet Take 25 mg by mouth every 6 (six) hours as needed for nausea or vomiting.   Yes Historical Provider, MD  topiramate (  TOPAMAX) 100 MG tablet Take 100 mg by mouth at bedtime.    Yes Historical Provider, MD    HPI  Presents with persistent migraine, 3 days.  Her typical HA, unilateral (RIGHT), n/v and photophobia.  She's having pain down into the RIGHT neck, which is not typical for her.  Relpax, ice, phenergan at home without relief.  She's been under significant stress lately.  Her step-son's newborn son, Blima Singer died of congenital anomalies and prematurity.  They are grieving considerably, and she is seeing her regular therapist in addition to another who specializes in grief counseling.    In addition, her diabetes regimen is changing under the guidance of endocrinology to achieve better  control.  Review of Systems As above.    Objective:   Physical Exam  Constitutional: She is oriented to person, place, and time. She appears well-developed and well-nourished. No distress.  BP 124/82  Pulse 74  Temp(Src) 97.2 F (36.2 C) (Oral)  Resp 18  Ht 5' (1.524 m)  Wt 207 lb (93.895 kg)  BMI 40.43 kg/m2  SpO2 98% Accompanied by her husband.  Resting in a darkened exam room, wearing sunglasses.   HENT:  Head: Normocephalic and atraumatic.  Mouth/Throat: No oropharyngeal exudate.  Eyes: Conjunctivae and EOM are normal. Pupils are equal, round, and reactive to light. No scleral icterus.  Neck: Normal range of motion. Neck supple. No thyromegaly present.  Cardiovascular: Normal rate, regular rhythm, normal heart sounds and intact distal pulses.   Pulmonary/Chest: Effort normal and breath sounds normal.  Abdominal: Soft. Bowel sounds are normal.  Lymphadenopathy:    She has no cervical adenopathy.  Neurological: She is alert and oriented to person, place, and time. No cranial nerve deficit.  Patient reports reduced sensation to light touch on the RIGHT face.  Skin: Skin is warm and dry.  Psychiatric: She has a normal mood and affect. Her behavior is normal. Thought content normal.          Assessment & Plan:  1. Migraine with aura and with status migrainosus, not intractable She typically gets good relief from toradol and phenergan. We're going to try lidocaine 4% solution as nasal drops-her pharmacy is trying to order it.  If they can't get it, we'll try another pharmacy. - promethazine (PHENERGAN) injection 25 mg; Inject 1 mL (25 mg total) into the muscle once. - ketorolac (TORADOL) 30 MG/ML injection 30 mg; Inject 1 mL (30 mg total) into the muscle once.   Fara Chute, PA-C Physician Assistant-Certified [facility]

## 2013-08-28 NOTE — Patient Instructions (Signed)
Let me know if [facility] cannot/will not get the lidocaine 4% solution.  I'll find another pharmacy that can/will.

## 2013-09-23 DIAGNOSIS — Z0271 Encounter for disability determination: Secondary | ICD-10-CM

## 2013-10-07 ENCOUNTER — Encounter: Payer: Self-pay | Admitting: Physician Assistant

## 2013-10-07 DIAGNOSIS — G7000 Myasthenia gravis without (acute) exacerbation: Secondary | ICD-10-CM | POA: Insufficient documentation

## 2013-10-07 DIAGNOSIS — H532 Diplopia: Secondary | ICD-10-CM

## 2013-10-21 ENCOUNTER — Other Ambulatory Visit: Payer: Self-pay | Admitting: Physician Assistant

## 2013-10-22 NOTE — Telephone Encounter (Signed)
Checked w/pt to see if they have seen endo yet. She verified that she has, and they are managing the DM meds now. Req for RF should have been sent there. Sent refusal w/note to resend to Dr Altheimer's office. Advised pt to also call Dr Altheimer's office bc she will be out of med tomorrow. Pt agreed.

## 2013-10-25 ENCOUNTER — Encounter: Payer: Self-pay | Admitting: Physician Assistant

## 2013-10-25 DIAGNOSIS — G7000 Myasthenia gravis without (acute) exacerbation: Secondary | ICD-10-CM

## 2013-10-28 LAB — BASIC METABOLIC PANEL
BUN: 12 mg/dL (ref 4–21)
CREATININE: 0.9 mg/dL (ref 0.5–1.1)
Glucose: 138 mg/dL
POTASSIUM: 4.6 mmol/L (ref 3.4–5.3)
Sodium: 144 mmol/L (ref 137–147)

## 2013-10-28 LAB — HEPATIC FUNCTION PANEL
ALT: 22 U/L (ref 7–35)
AST: 21 U/L (ref 13–35)
Alkaline Phosphatase: 74 U/L (ref 25–125)
BILIRUBIN, TOTAL: 0.2 mg/dL

## 2013-10-28 LAB — LIPID PANEL
Cholesterol: 140 mg/dL (ref 0–200)
HDL: 44 mg/dL (ref 35–70)
LDL Cholesterol: 68 mg/dL
LDL/HDL RATIO: 1.6
Triglycerides: 142 mg/dL (ref 40–160)

## 2013-10-28 LAB — HEMOGLOBIN A1C: HEMOGLOBIN A1C: 6.1 % — AB (ref 4.0–6.0)

## 2013-10-28 LAB — TSH: TSH: 4.23 u[IU]/mL (ref 0.41–5.90)

## 2013-11-11 ENCOUNTER — Ambulatory Visit (INDEPENDENT_AMBULATORY_CARE_PROVIDER_SITE_OTHER): Payer: BC Managed Care – PPO | Admitting: Physician Assistant

## 2013-11-11 VITALS — BP 96/64 | HR 88 | Temp 97.8°F | Resp 16 | Ht 60.0 in | Wt 195.6 lb

## 2013-11-11 DIAGNOSIS — G43901 Migraine, unspecified, not intractable, with status migrainosus: Secondary | ICD-10-CM

## 2013-11-11 DIAGNOSIS — Z23 Encounter for immunization: Secondary | ICD-10-CM

## 2013-11-11 DIAGNOSIS — E119 Type 2 diabetes mellitus without complications: Secondary | ICD-10-CM

## 2013-11-11 MED ORDER — ZOSTER VACCINE LIVE 19400 UNT/0.65ML ~~LOC~~ SOLR: 0.6500 mL | Freq: Once | SUBCUTANEOUS | Status: DC

## 2013-11-11 NOTE — Patient Instructions (Signed)

## 2013-11-11 NOTE — Progress Notes (Signed)
Subjective:    Patient ID: Michelle Torres, female    DOB: Oct 21, 1960, 53 y.o.   MRN: [id number]   PCP: [name], MD & Harrison Mons, PA-C  Chief Complaint  Patient presents with  . Migraine    started this morning   Medications, allergies, past medical history, surgical history, family history, social history and problem list reviewed and updated.  Patient Active Problem List   Diagnosis Date Noted  . 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 Vitamin D deficiency 08/27/2013  . Hypothyroidism 08/27/2013  . Migraine 08/14/2013  . Bipolar 2 disorder 03/05/2011  . Diabetes mellitus 03/05/2011  . Fatty infiltration of liver 03/02/2011    Prior to Admission medications   Medication Sig Start Date End Date Taking? Authorizing Provider  Calcium Carbonate-Vitamin D (OSCAL 500/200 D-3 PO) Take 2 tablets by mouth daily.    Yes Historical Provider, MD  clonazePAM (KLONOPIN) 1 MG tablet Take 4 mg by mouth at bedtime. May take 1/2 to 1 tablet during the day as needed   Yes Historical Provider, MD  divalproex (DEPAKOTE ER) 500 MG 24 hr tablet Take 1,500 mg by mouth at bedtime.    Yes Historical Provider, MD  Dulaglutide (TRULICITY) 1.02 HE/5.2DP SOPN Inject 1.5 mg into the skin once a week.    Yes Historical Provider, MD  eletriptan (RELPAX) 40 MG tablet Take 40 mg by mouth as needed for migraine. One tablet by mouth at onset of headache. May repeat in 2 hours if headache persists or recurs.   Yes Historical Provider, MD  fluticasone (FLONASE) 50 MCG/ACT nasal spray Place 2 sprays into the nose as needed for rhinitis.   Yes Historical Provider, MD  gabapentin (NEURONTIN) 600 MG tablet Take 600 mg by mouth at bedtime.     Yes Historical Provider, MD  insulin glargine (LANTUS) 100 UNIT/ML injection Inject 0.5 mLs (50 Units total) into the skin at bedtime. Increase dose by 2 units every morning until fasting glucose is below 140 mg/dl. start at 40 units every evening and increase as instructed  09/18/12  Yes Brice Kossman S Ramandeep Arington, PA-C  lamoTRIgine (LAMICTAL) 100 MG tablet Take 200 mg by mouth every morning.    Yes Historical Provider, MD  metFORMIN (GLUCOPHAGE-XR) 500 MG 24 hr tablet Take 1,000 mg by mouth 2 (two) times daily.   Yes Historical Provider, MD  naproxen (NAPROSYN) 500 MG tablet Take 500 mg by mouth 2 (two) times daily as needed (for pain).    Yes Historical Provider, MD  NEEDLE, DISP, 30 G 30G X 3/4" MISC Use as directed, daily blood sugars ( please dispense what patient had previously) 07/14/13  Yes Thao P Le, DO  OXcarbazepine (TRILEPTAL) 150 MG tablet Take 150 mg by mouth at bedtime.    Yes Historical Provider, MD  pravastatin (PRAVACHOL) 40 MG tablet Take 40 mg by mouth daily.   Yes Historical Provider, MD  pregabalin (LYRICA) 75 MG capsule Take 75 mg by mouth every morning.    Yes Historical Provider, MD  promethazine (PHENERGAN) 25 MG tablet Take 25 mg by mouth every 6 (six) hours as needed for nausea or vomiting.   Yes Historical Provider, MD  pyridostigmine (MESTINON) 60 MG tablet Take 60 mg by mouth 3 (three) times daily.   Yes Historical Provider, MD  sertraline (ZOLOFT) 25 MG tablet Take 25 mg by mouth daily.   Yes Historical Provider, MD  topiramate (TOPAMAX) 100 MG tablet Take 100 mg by mouth at bedtime.  Yes Historical Provider, MD  cetirizine (ZYRTEC) 10 MG tablet Take 10 mg by mouth daily.    Historical Provider, MD    HPI  Presents needing a prescription for the shingles vaccine.    She is having a migraine HA-awoke with it this morning, but it is mild, and she reports not needing evaluation or treatment for it today.  It's her typical migraine without any unusual features.  She plans to take a triptan upon arriving back home.  Recently diagnosed with ocular Myasthenia Gravis. Followed by Dr. Freida Busman at [facility] after initial diagnosis by [place], [place], here in [place]. Continues to have double vision.  Wearing a patch on the LEFT eye to help her symptoms.   Mestinon is causing some GI upset, and she has a call into Dr. Vallarie Mare' office regarding that. The next step in treatment, which they anticipate needing to try, is immunosuppressive, and therefore the shingles vaccine is recommended.  Saw Dr. Elyse Hsu last week, and A1C was 6.1!  She's been able to reduce the insulin dose, and cholesterol was improved on the lower dose of pravastatin.   Review of Systems As above. No CP, SOB, diarrhea.    Objective:   Physical Exam  Constitutional: She is oriented to person, place, and time. She appears well-developed and well-nourished. She is active and cooperative. No distress.  BP 96/64  Pulse 88  Temp(Src) 97.8 F (36.6 C) (Oral)  Resp 16  Ht 5' (1.524 m)  Wt 195 lb 9.6 oz (88.724 kg)  BMI 38.20 kg/m2  SpO2 95%   Eyes: Conjunctivae are normal.  Neck: Neck supple. No thyromegaly present.  Cardiovascular: Normal rate, regular rhythm and normal heart sounds.   Pulmonary/Chest: Effort normal and breath sounds normal.  Lymphadenopathy:    She has no cervical adenopathy.  Neurological: She is alert and oriented to person, place, and time.  Skin: Skin is warm and dry.  Psychiatric: She has a normal mood and affect. Her speech is normal and behavior is normal.          Assessment & Plan:  1. Migraine with status migrainosus, not intractable, unspecified migraine type She declines additional evaluation and treatment here today. She will take Relpax when she arrives back home.  2. Need for shingles vaccine - zoster vaccine live, PF, (ZOSTAVAX) 35701 UNT/0.65ML injection; Inject 19,400 Units into the skin once.  Dispense: 0.65 mL; Refill: 0  3. Need for influenza vaccination - Flu Vaccine QUAD 36+ mos IM  4. Type 2 diabetes mellitus without complication Much improved! Congratulated on her efforts and compliance with recommendations from endocrinilogy.  Keep up the great work!   Fara Chute, PA-C Physician  Assistant-Certified [facility]

## 2013-11-25 ENCOUNTER — Encounter: Payer: Self-pay | Admitting: Physician Assistant

## 2013-12-07 ENCOUNTER — Encounter: Payer: Self-pay | Admitting: Physician Assistant

## 2013-12-07 ENCOUNTER — Ambulatory Visit (INDEPENDENT_AMBULATORY_CARE_PROVIDER_SITE_OTHER): Payer: BC Managed Care – PPO | Admitting: Physician Assistant

## 2013-12-07 VITALS — BP 122/68 | HR 114 | Temp 98.4°F | Resp 17 | Ht 60.0 in | Wt 195.0 lb

## 2013-12-07 DIAGNOSIS — E119 Type 2 diabetes mellitus without complications: Secondary | ICD-10-CM

## 2013-12-07 DIAGNOSIS — G7000 Myasthenia gravis without (acute) exacerbation: Secondary | ICD-10-CM

## 2013-12-07 LAB — MICROALBUMIN, URINE: Microalb, Ur: 0.5 mg/dL (ref <2.0)

## 2013-12-07 NOTE — Patient Instructions (Signed)
Give the urine microalbumin results to Dr. Elyse Hsu for his records.

## 2013-12-07 NOTE — Progress Notes (Signed)
Subjective:    Patient ID: Michelle Torres, female    DOB: 11/10/1960, 53 y.o.   MRN: [id number]   PCP: [name], MD  Chief Complaint  Patient presents with  . Follow-up    disability     Medications, allergies, past medical history, surgical history, family history, social history and problem list reviewed and updated.  Patient Active Problem List   Diagnosis Date Noted  . 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 Vitamin D deficiency 08/27/2013  . Hypothyroidism 08/27/2013  . Migraine 08/14/2013  . Bipolar 2 disorder 03/05/2011  . Diabetes mellitus 03/05/2011  . Fatty infiltration of liver 03/02/2011    Prior to Admission medications   Medication Sig Start Date End Date Taking? Authorizing Provider  Calcium Carbonate-Vitamin D (OSCAL 500/200 D-3 PO) Take 2 tablets by mouth daily.    Yes Historical Provider, MD  cetirizine (ZYRTEC) 10 MG tablet Take 10 mg by mouth daily.   Yes Historical Provider, MD  clonazePAM (KLONOPIN) 1 MG tablet Take 4 mg by mouth at bedtime. May take 1/2 to 1 tablet during the day as needed   Yes Historical Provider, MD  divalproex (DEPAKOTE ER) 500 MG 24 hr tablet Take 1,500 mg by mouth at bedtime.    Yes Historical Provider, MD  Dulaglutide (TRULICITY) 4.17 EY/8.1KG SOPN Inject 1.5 mg into the skin once a week.    Yes Historical Provider, MD  eletriptan (RELPAX) 40 MG tablet Take 40 mg by mouth as needed for migraine. One tablet by mouth at onset of headache. May repeat in 2 hours if headache persists or recurs.   Yes Historical Provider, MD  fluticasone (FLONASE) 50 MCG/ACT nasal spray Place 2 sprays into the nose as needed for rhinitis.   Yes Historical Provider, MD  gabapentin (NEURONTIN) 600 MG tablet Take 600 mg by mouth at bedtime.     Yes Historical Provider, MD  insulin glargine (LANTUS) 100 UNIT/ML injection Inject 0.5 mLs (50 Units total) into the skin at bedtime. Increase dose by 2 units every morning until fasting glucose is below 140  mg/dl. start at 40 units every evening and increase as instructed 09/18/12  Yes Krishawna Stiefel S Nyasiah Moffet, PA-C  lamoTRIgine (LAMICTAL) 100 MG tablet Take 200 mg by mouth every morning.    Yes Historical Provider, MD  metFORMIN (GLUCOPHAGE-XR) 500 MG 24 hr tablet Take 1,000 mg by mouth 2 (two) times daily.   Yes Historical Provider, MD  naproxen (NAPROSYN) 500 MG tablet Take 500 mg by mouth 2 (two) times daily as needed (for pain).    Yes Historical Provider, MD  NEEDLE, DISP, 30 G 30G X 3/4" MISC Use as directed, daily blood sugars ( please dispense what patient had previously) 07/14/13  Yes Thao P Le, DO  OXcarbazepine (TRILEPTAL) 150 MG tablet Take 150 mg by mouth at bedtime.    Yes Historical Provider, MD  pravastatin (PRAVACHOL) 40 MG tablet Take 40 mg by mouth daily.   Yes Historical Provider, MD  pregabalin (LYRICA) 75 MG capsule Take 75 mg by mouth every morning.    Yes Historical Provider, MD  promethazine (PHENERGAN) 25 MG tablet Take 25 mg by mouth every 6 (six) hours as needed for nausea or vomiting.   Yes Historical Provider, MD  sertraline (ZOLOFT) 25 MG tablet Take 25 mg by mouth daily.   Yes Historical Provider, MD  topiramate (TOPAMAX) 100 MG tablet Take 100 mg by mouth at bedtime.    Yes Historical Provider, MD  HPI  Presents for completion of paperwork in anticipation of needing to take short term disability, and to update FMLA forms.  She's been diagnosed with ocular myasthenia gravis.  Paper record is reviewed and we see evaluation for possible MG as far back as 02/02/2009.  No until this year was definitive diagnosis made.  She is having more trouble with diplopia than her current FMLA allows, and needs an update for that.  To see neuro-ophthalmology again next week. Due to intolerance to mestinon, will try prism lenses before starting immunosuppressive treatment. CT did not reveal a thymoma. She may need to take STD if the prism lenses don't help or for adverse effects she may  experience with immunotherapy.   Very pleased with her visits with Dr. Elyse Hsu, though we don't have her most recent visit notes or lab results. She reports not having provided a urine specimen and would like to take care of the outstanding urine microalbumin listed in My Chart.  Review of Systems     Objective:   Physical Exam  BP 122/68  Pulse 114  Temp(Src) 98.4 F (36.9 C) (Oral)  Resp 17  Ht 5' (1.524 m)  Wt 195 lb (88.451 kg)  BMI 38.08 kg/m2  SpO2 94% WDWNWF, A&O x 3. Heart and lungs are clear. She's in a good mood, has appropriate affect and exhibits normal behavior and speech. Skin is warm and dry.      Assessment & Plan:  1. Ocular myasthenia gravis Forms completed, leaving blank dates of disability, as that has not yet been determined.  2. Type 2 diabetes mellitus without complication Continue per Dr. Darryl Nestle instructions. - Microalbumin, urine   Fara Chute, PA-C Physician Assistant-Certified [facility]

## 2013-12-08 ENCOUNTER — Encounter: Payer: Self-pay | Admitting: Family Medicine

## 2013-12-09 ENCOUNTER — Ambulatory Visit (INDEPENDENT_AMBULATORY_CARE_PROVIDER_SITE_OTHER): Payer: BC Managed Care – PPO | Admitting: Physician Assistant

## 2013-12-09 VITALS — BP 100/70 | HR 97 | Temp 98.6°F | Resp 16 | Ht 60.0 in | Wt 197.0 lb

## 2013-12-09 DIAGNOSIS — G7000 Myasthenia gravis without (acute) exacerbation: Secondary | ICD-10-CM

## 2013-12-09 NOTE — Progress Notes (Signed)
   Subjective:    Patient ID: Michelle Torres, female    DOB: 1960-02-23, 53 y.o.   MRN: [id number]   PCP: [name], MD  Chief Complaint  Patient presents with  . Follow-up    Ocular myasthenia gravis    HPI  Saw Dr. Oleta Mouse today to discuss possible prism lens for correction of diplopia secondary to ocular myasthenia gravis. Advised that isn't a viable option given the intermittent nature of her symptoms, with varied severity.  She was advised to use a patch, which she doesn't want to do. She has follow-up with neurology coming up to discuss additional medications, which unfortunately cause immunosuppression.  They need her papers for disability completed using the date 11/10/2013 as the first day of disability, with duration TBD depending on her response to treatment.  Review of Systems     Objective:   Physical Exam  BP 100/70  Pulse 97  Temp(Src) 98.6 F (37 C) (Oral)  Resp 16  Ht 5' (1.524 m)  Wt 197 lb (89.359 kg)  BMI 38.47 kg/m2  SpO2 96% WDWNWF, A&O x 3. Speech, behavior, mood and affect are appropriate.      Assessment & Plan:  1. Ocular myasthenia gravis Forms completed. Scan.   Fara Chute, PA-C Physician Assistant-Certified [facility]

## 2013-12-09 NOTE — Patient Instructions (Signed)
Embrace your inner pirate! Try it out for Michelle Torres is almost Halloween, after all!

## 2014-01-06 ENCOUNTER — Telehealth: Payer: Self-pay

## 2014-01-06 ENCOUNTER — Ambulatory Visit (INDEPENDENT_AMBULATORY_CARE_PROVIDER_SITE_OTHER): Payer: BC Managed Care – PPO | Admitting: Physician Assistant

## 2014-01-06 VITALS — BP 93/65 | HR 88 | Temp 98.2°F | Resp 18 | Wt 197.0 lb

## 2014-01-06 DIAGNOSIS — G7000 Myasthenia gravis without (acute) exacerbation: Secondary | ICD-10-CM

## 2014-01-06 DIAGNOSIS — H532 Diplopia: Secondary | ICD-10-CM

## 2014-01-06 DIAGNOSIS — R112 Nausea with vomiting, unspecified: Secondary | ICD-10-CM

## 2014-01-06 DIAGNOSIS — R197 Diarrhea, unspecified: Secondary | ICD-10-CM

## 2014-01-06 MED ORDER — PROMETHAZINE HCL 25 MG RE SUPP
25.0000 mg | Freq: Four times a day (QID) | RECTAL | Status: DC | PRN
Start: 2014-01-06 — End: 2016-03-27

## 2014-01-06 MED ORDER — PROMETHAZINE HCL 25 MG PO TABS: 25.0000 mg | ORAL_TABLET | Freq: Four times a day (QID) | ORAL | Status: DC | PRN

## 2014-01-06 MED ORDER — ONDANSETRON 8 MG PO TBDP: 4.0000 mg | ORAL_TABLET | Freq: Three times a day (TID) | ORAL | Status: DC | PRN

## 2014-01-06 NOTE — Telephone Encounter (Signed)
PA needed for zofran. Completed on the phone and it was approved through 01/05/14, case # [id number]. Notified pharm.

## 2014-01-06 NOTE — Progress Notes (Signed)
Subjective:    Patient ID: Michelle Torres, female    DOB: 08/14/1960, 53 y.o.   MRN: [id number]   PCP: [name], MD  Chief Complaint  Patient presents with  . Follow-up  . Myasthenia Gravis    Allergies  Allergen Reactions  . Mestinon [Pyridostigmine Bromide] Nausea And Vomiting    "violently ill"  . Risperidone And Related Other (See Comments)    liver damage    Patient Active Problem List   Diagnosis Date Noted  . 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 Vitamin D deficiency 08/27/2013  . Hypothyroidism 08/27/2013  . Migraine 08/14/2013  . Bipolar 2 disorder 03/05/2011  . Diabetes mellitus 03/05/2011  . Fatty infiltration of liver 03/02/2011    Prior to Admission medications   Medication Sig Start Date End Date Taking? Authorizing Provider  Calcium Carbonate-Vitamin D (OSCAL 500/200 D-3 PO) Take 2 tablets by mouth daily.    Yes Historical Provider, MD  cetirizine (ZYRTEC) 10 MG tablet Take 10 mg by mouth daily.   Yes Historical Provider, MD  clonazePAM (KLONOPIN) 1 MG tablet Take 4 mg by mouth at bedtime. May take 1/2 to 1 tablet during the day as needed   Yes Historical Provider, MD  divalproex (DEPAKOTE ER) 500 MG 24 hr tablet Take 1,500 mg by mouth at bedtime.    Yes Historical Provider, MD  Dulaglutide (TRULICITY) 7.51 ZG/0.1VC SOPN Inject 1.5 mg into the skin once a week.    Yes Historical Provider, MD  eletriptan (RELPAX) 40 MG tablet Take 40 mg by mouth as needed for migraine. One tablet by mouth at onset of headache. May repeat in 2 hours if headache persists or recurs.   Yes Historical Provider, MD  fluticasone (FLONASE) 50 MCG/ACT nasal spray Place 2 sprays into the nose as needed for rhinitis.   Yes Historical Provider, MD  gabapentin (NEURONTIN) 600 MG tablet Take 600 mg by mouth at bedtime.     Yes Historical Provider, MD  insulin glargine (LANTUS) 100 UNIT/ML injection Inject 0.5 mLs (50 Units total) into the skin at bedtime. Increase dose by 2 units  every morning until fasting glucose is below 140 mg/dl. start at 40 units every evening and increase as instructed 09/18/12  Yes Aleigha Gilani S Kamani Lewter, PA-C  lamoTRIgine (LAMICTAL) 100 MG tablet Take 200 mg by mouth every morning.    Yes Historical Provider, MD  metFORMIN (GLUCOPHAGE-XR) 500 MG 24 hr tablet Take 1,000 mg by mouth 2 (two) times daily.   Yes Historical Provider, MD  naproxen (NAPROSYN) 500 MG tablet Take 500 mg by mouth 2 (two) times daily as needed (for pain).    Yes Historical Provider, MD  NEEDLE, DISP, 30 G 30G X 3/4" MISC Use as directed, daily blood sugars ( please dispense what patient had previously) 07/14/13  Yes Thao P Le, DO  OXcarbazepine (TRILEPTAL) 150 MG tablet Take 150 mg by mouth at bedtime.    Yes Historical Provider, MD  pravastatin (PRAVACHOL) 40 MG tablet Take 40 mg by mouth daily.   Yes Historical Provider, MD  pregabalin (LYRICA) 75 MG capsule Take 75 mg by mouth every morning.    Yes Historical Provider, MD  promethazine (PHENERGAN) 25 MG tablet Take 25 mg by mouth every 6 (six) hours as needed for nausea or vomiting.   Yes Historical Provider, MD  sertraline (ZOLOFT) 25 MG tablet Take 25 mg by mouth daily.   Yes Historical Provider, MD  topiramate (TOPAMAX) 25 MG tablet Take 25 mg  by mouth 2 (two) times daily.   Yes Historical Provider, MD    Medical, Surgical, Family and Social History reviewed and updated.  HPI  Presents needing re-certification of need for work absence. Accompanied by her husband, as usual. She continues to work with specialists at [facility] for treatment of double vision due to ocular myasthenia gravis. She did not tolerate mestinon, and is not a candidate for a prism lens.  She is now wearing a new pair of progressive lens glasses, and is using a pair of over the glasses sunglasses with the RIGHT lens removed and the LEFT lens covered with black electrical tape.  It's not ideal, but is helping to control her symptoms. When she develops  diplopia, which can be at any time without specific triggers, she develops dizziness, nausea and vomiting. She needs a refill of promethazine, but also wonders if there is an alternative that won't cause so much drowsiness. The promethazine puts her to sleep. Hopes to RTW next month, but isn't sure it will be possible.  In addition, she has intermittent diarrhea.  Symptoms began prior to the addition of Trulicity, and didn't seem to be related to metformin (no response to dose adjustments). Diabetes is much better controlled, recent home glucose readings <200. She's been told previously that she had diarrhea-predominant IBS, but did not follow-up with the specialist due to poor fit personality-wise.   Review of Systems As above.    Objective:   Physical Exam  Constitutional: She is oriented to person, place, and time. She appears well-developed and well-nourished. She is active and cooperative. No distress.  BP 93/65 mmHg  Pulse 88  Temp(Src) 98.2 F (36.8 C) (Oral)  Resp 18  Wt 197 lb (89.359 kg)  SpO2 96%   Eyes: Conjunctivae are normal. No scleral icterus.  Neck: Neck supple. No thyromegaly present.  Cardiovascular: Normal rate, regular rhythm and normal heart sounds.   Pulmonary/Chest: Effort normal and breath sounds normal.  Lymphadenopathy:    She has no cervical adenopathy.  Neurological: She is alert and oriented to person, place, and time.  Skin: Skin is warm and dry.  Psychiatric: She has a normal mood and affect. Her speech is normal and behavior is normal.          Assessment & Plan:  1. Ocular myasthenia gravis 2. Double vision 3. Non-intractable vomiting with nausea, vomiting of unspecified type Form completed. Refilled promethazine suppositories for home use as needed. Trial of ondansetron for nausea relief when she needs to be able to stay awake. - ondansetron (ZOFRAN-ODT) 8 MG disintegrating tablet; Take 0.5-1 tablets (4-8 mg total) by mouth every 8 (eight)  hours as needed for nausea.  Dispense: 90 tablet; Refill: 0  4. Diarrhea Needs evaluation with GI. In the meantime, she may find some stool changes due to the ondansetron, which can cause constipations. - Ambulatory referral to Gastroenterology   Fara Chute, PA-C Physician Assistant-Certified [facility]

## 2014-01-06 NOTE — Patient Instructions (Signed)
Try taking the Zofran (Ondansetron) when you develop the double vision and dizziness, before the nausea begins.

## 2014-02-08 ENCOUNTER — Ambulatory Visit (INDEPENDENT_AMBULATORY_CARE_PROVIDER_SITE_OTHER): Payer: BC Managed Care – PPO | Admitting: Physician Assistant

## 2014-02-08 VITALS — BP 122/76 | HR 104 | Temp 98.5°F | Resp 18 | Ht 60.25 in | Wt 197.2 lb

## 2014-02-08 DIAGNOSIS — E119 Type 2 diabetes mellitus without complications: Secondary | ICD-10-CM

## 2014-02-08 DIAGNOSIS — R112 Nausea with vomiting, unspecified: Secondary | ICD-10-CM

## 2014-02-08 DIAGNOSIS — G7000 Myasthenia gravis without (acute) exacerbation: Secondary | ICD-10-CM

## 2014-02-08 DIAGNOSIS — R197 Diarrhea, unspecified: Secondary | ICD-10-CM

## 2014-02-08 DIAGNOSIS — M8580 Other specified disorders of bone density and structure, unspecified site: Secondary | ICD-10-CM

## 2014-02-08 MED ORDER — OMEPRAZOLE 40 MG PO CPDR: 40.0000 mg | DELAYED_RELEASE_CAPSULE | Freq: Every day | ORAL | Status: DC

## 2014-02-08 NOTE — Patient Instructions (Signed)
Dr. Ulyses Amor office is [facility] at [address], [address], [place], [place] [address] [phone] Monday 02/15/2014 at 8:45 am

## 2014-02-08 NOTE — Progress Notes (Signed)
Subjective:    Patient ID: Michelle Torres, female    DOB: 08/08/1960, 53 y.o.   MRN: [id number]   PCP: [name], MD  Chief Complaint  Patient presents with  . Follow-up  . GI issues  . Referral    to a GI     Allergies  Allergen Reactions  . Mestinon [Pyridostigmine Bromide] Nausea And Vomiting    "violently ill"  . Risperidone And Related Other (See Comments)    liver damage    Patient Active Problem List   Diagnosis Date Noted  . 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 Vitamin D deficiency 08/27/2013  . Hypothyroidism 08/27/2013  . Migraine 08/14/2013  . Bipolar 2 disorder 03/05/2011  . Diabetes mellitus 03/05/2011  . Fatty infiltration of liver 03/02/2011    Prior to Admission medications   Medication Sig Start Date End Date Taking? Authorizing Provider  Calcium Carbonate-Vitamin D (OSCAL 500/200 D-3 PO) Take 2 tablets by mouth daily.    Yes Historical Provider, MD  cetirizine (ZYRTEC) 10 MG tablet Take 10 mg by mouth daily.   Yes Historical Provider, MD  clonazePAM (KLONOPIN) 1 MG tablet Take 4 mg by mouth at bedtime. May take 1/2 to 1 tablet during the day as needed   Yes Historical Provider, MD  divalproex (DEPAKOTE ER) 500 MG 24 hr tablet Take 1,500 mg by mouth at bedtime.    Yes Historical Provider, MD  Dulaglutide (TRULICITY) 2.48 GN/0.0BB SOPN Inject 1.5 mg into the skin once a week.    Yes Historical Provider, MD  eletriptan (RELPAX) 40 MG tablet Take 40 mg by mouth as needed for migraine. One tablet by mouth at onset of headache. May repeat in 2 hours if headache persists or recurs.   Yes Historical Provider, MD  fluticasone (FLONASE) 50 MCG/ACT nasal spray Place 2 sprays into the nose as needed for rhinitis.   Yes Historical Provider, MD  gabapentin (NEURONTIN) 600 MG tablet Take 600 mg by mouth at bedtime.     Yes Historical Provider, MD  insulin glargine (LANTUS) 100 UNIT/ML injection Inject 0.5 mLs (50 Units total) into the skin at bedtime. Increase  dose by 2 units every morning until fasting glucose is below 140 mg/dl. start at 40 units every evening and increase as instructed Patient taking differently: Inject 60 Units into the skin at bedtime. Increase dose by 2 units every morning until fasting glucose is below 140 mg/dl. start at 40 units every evening and increase as instructed 09/18/12  Yes Gloriann Riede S Barby Colvard, PA-C  lamoTRIgine (LAMICTAL) 100 MG tablet Take 200 mg by mouth every morning.    Yes Historical Provider, MD  metFORMIN (GLUCOPHAGE-XR) 500 MG 24 hr tablet Take 1,000 mg by mouth 2 (two) times daily.   Yes Historical Provider, MD  naproxen (NAPROSYN) 500 MG tablet Take 500 mg by mouth 2 (two) times daily as needed (for pain).    Yes Historical Provider, MD  NEEDLE, DISP, 30 G 30G X 3/4" MISC Use as directed, daily blood sugars ( please dispense what patient had previously) 07/14/13  Yes Thao P Le, DO  ondansetron (ZOFRAN-ODT) 8 MG disintegrating tablet Take 0.5-1 tablets (4-8 mg total) by mouth every 8 (eight) hours as needed for nausea. 01/06/14  Yes Brayden Brodhead S Johncarlo Maalouf, PA-C  OXcarbazepine (TRILEPTAL) 150 MG tablet Take 150 mg by mouth at bedtime.    Yes Historical Provider, MD  pravastatin (PRAVACHOL) 40 MG tablet Take 40 mg by mouth daily.   Yes Historical Provider,  MD  predniSONE (DELTASONE) 10 MG tablet Take 10 mg by mouth daily with breakfast.   Yes Historical Provider, MD  pregabalin (LYRICA) 75 MG capsule Take 75 mg by mouth every morning.    Yes Historical Provider, MD  promethazine (PHENERGAN) 25 MG suppository Place 1 suppository (25 mg total) rectally every 6 (six) hours as needed for nausea or vomiting. 01/06/14  Yes Ahmod Gillespie S Cecilie Heidel, PA-C  sertraline (ZOLOFT) 25 MG tablet Take 25 mg by mouth daily.   Yes Historical Provider, MD  topiramate (TOPAMAX) 25 MG tablet Take 25 mg by mouth 2 (two) times daily.   Yes Historical Provider, MD  omeprazole (PRILOSEC) 40 MG capsule Take 1 capsule (40 mg total) by mouth daily. 02/08/14    Jefferey Lippmann Janalee Dane, PA-C  ONETOUCH VERIO test strip  01/25/14   Historical Provider, MD    Medical, Surgical, Family and Social History reviewed and updated.  HPI  Presents for follow-up, specifically needing her monthly disability forms completed and requesting referral to GI.  Michelle Torres has ocular myasthenia gravis causing double vision, which in turn causes dizziness, nausea and vomiting.  These symptoms do not allow her to work currently. She had hoped to return to work this month, but thus far her treatment has not adequately controlled her symptoms.  She is followed at [facility] in the Neurology Department and [facility]. She is not a candidate for a prism. The did not tolerate Mestinon. She is now on a trial of prednisone 10 mg daily, and checking her glucose more regularly.  Lantus dose has been increased to accommodate the rising glucose due to the prednisone. She follows up with Neurology in late January, ophthalmology in February and endocrinology in between.  While she has nausea and vomiting with MG, she was having some prior to the onset of the double vision. Diarrhea has also been a chronic problem. She was referred to GI in the summer of 2014 but did not go.  I referred her again recently, but no one notified her of the appointment scheduled for 11/24 so she missed it. Her symptoms are sometimes accompanied by a burning sensation in the upper abdomen and heartburn, increased belching.  Her husband's omeprazole has helped when she has tried it on occasion.  In the meantime, she has had a DEXA with Dr. Radene Knee revealing osteopenia, for which 1000 IU of vitamin D daily and moderate weight bearing exercise 3-4 times weekly has been recommended. Thendara result is "consistent with menopause."  Review of Systems As above.    Objective:   Physical Exam  Constitutional: She is oriented to person, place, and time. She appears well-developed and well-nourished. She is active  and cooperative. No distress.  BP 122/76 mmHg  Pulse 104  Temp(Src) 98.5 F (36.9 C) (Oral)  Resp 18  Ht 5' 0.25" (1.53 m)  Wt 197 lb 3.2 oz (89.449 kg)  BMI 38.21 kg/m2  SpO2 95%   Eyes: Conjunctivae are normal.  Pulmonary/Chest: Effort normal.  Neurological: She is alert and oriented to person, place, and time.  Psychiatric: She has a normal mood and affect. Her speech is normal and behavior is normal.          Assessment & Plan:  1. Ocular myasthenia gravis Continue follow-up with Neurology and Ophthalmology at [facility].  2. Type 2 diabetes mellitus without complication Continue follow-up with Dr. Elyse Hsu.   3. Non-intractable vomiting with nausea, vomiting of unspecified type 4. Diarrhea I called and rescheduled  her GI appointment for 02/15/2014 at 8:45 am. Proceed with her own prescription for PPI. - omeprazole (PRILOSEC) 40 MG capsule; Take 1 capsule (40 mg total) by mouth daily.  Dispense: 30 capsule; Refill: 3   Fara Chute, PA-C Physician Assistant-Certified [facility]

## 2014-02-09 ENCOUNTER — Telehealth: Payer: Self-pay | Admitting: *Deleted

## 2014-02-09 NOTE — Telephone Encounter (Signed)
Enter in error

## 2014-02-15 ENCOUNTER — Encounter: Payer: Self-pay | Admitting: Physician Assistant

## 2014-02-15 DIAGNOSIS — K589 Irritable bowel syndrome without diarrhea: Secondary | ICD-10-CM

## 2014-02-15 DIAGNOSIS — K219 Gastro-esophageal reflux disease without esophagitis: Secondary | ICD-10-CM

## 2014-03-17 ENCOUNTER — Ambulatory Visit (INDEPENDENT_AMBULATORY_CARE_PROVIDER_SITE_OTHER): Payer: BC Managed Care – PPO | Admitting: Physician Assistant

## 2014-03-17 VITALS — BP 126/84 | HR 101 | Temp 98.6°F | Resp 18 | Ht 60.0 in | Wt 198.6 lb

## 2014-03-17 DIAGNOSIS — E119 Type 2 diabetes mellitus without complications: Secondary | ICD-10-CM

## 2014-03-17 DIAGNOSIS — E039 Hypothyroidism, unspecified: Secondary | ICD-10-CM

## 2014-03-17 DIAGNOSIS — K219 Gastro-esophageal reflux disease without esophagitis: Secondary | ICD-10-CM

## 2014-03-17 DIAGNOSIS — K589 Irritable bowel syndrome without diarrhea: Secondary | ICD-10-CM

## 2014-03-17 DIAGNOSIS — G7000 Myasthenia gravis without (acute) exacerbation: Secondary | ICD-10-CM

## 2014-03-17 DIAGNOSIS — E559 Vitamin D deficiency, unspecified: Secondary | ICD-10-CM

## 2014-03-17 NOTE — Progress Notes (Signed)
Subjective:    Patient ID: Michelle Torres, female    DOB: 07/31/1960, 54 y.o.   MRN: [id number]   PCP: [name], MD  Chief Complaint  Patient presents with  . Consult    Pt. wants to discuss recent visit with her GI doctor. And to discuss short-term disabilty.     Allergies  Allergen Reactions  . Mestinon [Pyridostigmine Bromide] Nausea And Vomiting    "violently ill"  . Risperidone And Related Other (See Comments)    liver damage    Patient Active Problem List   Diagnosis Date Noted  . GERD (gastroesophageal reflux disease) 02/15/2014  . IBS (irritable bowel syndrome) 02/15/2014  . Osteopenia 02/08/2014  . 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 Vitamin D deficiency 08/27/2013  . Hypothyroidism 08/27/2013  . Migraine 08/14/2013  . Bipolar 2 disorder 03/05/2011  . Diabetes mellitus 03/05/2011  . Fatty infiltration of liver 03/02/2011    Prior to Admission medications   Medication Sig Start Date End Date Taking? Authorizing Provider  Calcium Carbonate-Vitamin D (OSCAL 500/200 D-3 PO) Take 2 tablets by mouth daily.    Yes Historical Provider, MD  cetirizine (ZYRTEC) 10 MG tablet Take 10 mg by mouth daily.   Yes Historical Provider, MD  clonazePAM (KLONOPIN) 1 MG tablet Take 4 mg by mouth at bedtime. May take 1/2 to 1 tablet during the day as needed   Yes Historical Provider, MD  divalproex (DEPAKOTE ER) 500 MG 24 hr tablet Take 1,500 mg by mouth at bedtime.    Yes Historical Provider, MD  Dulaglutide (TRULICITY) 0.86 VH/8.4ON SOPN Inject 1.5 mg into the skin once a week.    Yes Historical Provider, MD  eletriptan (RELPAX) 40 MG tablet Take 40 mg by mouth as needed for migraine. One tablet by mouth at onset of headache. May repeat in 2 hours if headache persists or recurs.   Yes Historical Provider, MD  Eluxadoline 100 MG TABS Take 100 mg by mouth 2 (two) times daily.   Yes Historical Provider, MD  fluticasone (FLONASE) 50 MCG/ACT nasal spray Place 2 sprays into the nose  as needed for rhinitis.   Yes Historical Provider, MD  gabapentin (NEURONTIN) 600 MG tablet Take 600 mg by mouth at bedtime.     Yes Historical Provider, MD  insulin glargine (LANTUS) 100 UNIT/ML injection Inject 0.5 mLs (50 Units total) into the skin at bedtime. Increase dose by 2 units every morning until fasting glucose is below 140 mg/dl. start at 40 units every evening and increase as instructed Patient taking differently: Inject 60 Units into the skin at bedtime. Increase dose by 2 units every morning until fasting glucose is below 140 mg/dl. start at 40 units every evening and increase as instructed 09/18/12  Yes Jianni Batten S Michaelangelo Mittelman, PA-C  lamoTRIgine (LAMICTAL) 100 MG tablet Take 200 mg by mouth every morning.    Yes Historical Provider, MD  metFORMIN (GLUCOPHAGE-XR) 500 MG 24 hr tablet Take 1,000 mg by mouth 2 (two) times daily.   Yes Historical Provider, MD  naproxen (NAPROSYN) 500 MG tablet Take 500 mg by mouth 2 (two) times daily as needed (for pain).    Yes Historical Provider, MD  NEEDLE, DISP, 30 G 30G X 3/4" MISC Use as directed, daily blood sugars ( please dispense what patient had previously) 07/14/13  Yes Thao P Le, DO  omeprazole (PRILOSEC) 40 MG capsule Take 1 capsule (40 mg total) by mouth daily. 02/08/14  Yes Torey Reinard S Kendyll Huettner, PA-C  ondansetron (  ZOFRAN-ODT) 8 MG disintegrating tablet Take 0.5-1 tablets (4-8 mg total) by mouth every 8 (eight) hours as needed for nausea. 01/06/14  Yes Nitika Jackowski Janalee Dane, PA-C  ONETOUCH VERIO test strip  01/25/14  Yes Historical Provider, MD  OXcarbazepine (TRILEPTAL) 150 MG tablet Take 150 mg by mouth at bedtime.    Yes Historical Provider, MD  pravastatin (PRAVACHOL) 40 MG tablet Take 40 mg by mouth daily.   Yes Historical Provider, MD  predniSONE (DELTASONE) 10 MG tablet Take 10 mg by mouth 2 (two) times daily with a meal.    Yes Historical Provider, MD  pregabalin (LYRICA) 75 MG capsule Take 75 mg by mouth every morning.    Yes Historical Provider, MD    promethazine (PHENERGAN) 25 MG suppository Place 1 suppository (25 mg total) rectally every 6 (six) hours as needed for nausea or vomiting. 01/06/14  Yes Latrell Reitan S Revin Corker, PA-C  sertraline (ZOLOFT) 25 MG tablet Take 25 mg by mouth daily.   Yes Historical Provider, MD  topiramate (TOPAMAX) 25 MG tablet Take 25 mg by mouth 2 (two) times daily.   Yes Historical Provider, MD    Medical, Surgical, Family and Social History reviewed and updated.  HPI  Presents to discuss recent visit with Dr. Benson Norway. Also needs disability forms completed.  Very pleased with results on Viberzi. In fact, has had constipation. To see Dr. Benson Norway again in several months.  Nausea is much better. Zofran helps.  Still has double vision, dizziness and nausea/vomiting, though it seems to be improving. Dr. Dema Severin increased prednisone dose from 10 mg daily to 20 mg.  Sees Dr. Elyse Hsu next month. Glucose is running high. "We're chasing them" running mid-200's. Recent increase in prednisone dose to address ocular Myasthenia Gravis has worsened things. Drinking lots of water. Minimal exercise.  Otherwise, she feels well.  Review of Systems As above.    Objective:   Physical Exam  Constitutional: She is oriented to person, place, and time. She appears well-developed and well-nourished. She is active and cooperative. No distress.  BP 126/84 mmHg  Pulse 101  Temp(Src) 98.6 F (37 C) (Oral)  Resp 18  Ht 5' (1.524 m)  Wt 198 lb 9.6 oz (90.084 kg)  BMI 38.79 kg/m2  SpO2 96%   Eyes: Conjunctivae are normal.  Pulmonary/Chest: Effort normal.  Neurological: She is alert and oriented to person, place, and time.  Psychiatric: She has a normal mood and affect. Her speech is normal and behavior is normal.          Assessment & Plan:  1. Ocular myasthenia gravis Stable/some improvement. Continue follow-up with Dr. Dema Severin. Ask about the dosing schedule of the prednisone to clarify. Disability forms completed.  2. IBS  (irritable bowel syndrome) 3. Gastroesophageal reflux disease, esophagitis presence not specified Much improved. Continue follow-up per Dr. Ulyses Amor recommendations.  4. Hypothyroidism, unspecified hypothyroidism type 5. Type 2 diabetes mellitus without complication 6. Vitamin D deficiency Worsening glucose control since increase in prednisone dose. Contact Dr. Altheimer's office for instructions. Increase water intake. Increase exercise.    Fara Chute, PA-C Physician Assistant-Certified [facility]

## 2014-03-17 NOTE — Patient Instructions (Addendum)
Check with Dr.White on the prednisone. Does he want you to take the 20 mg all in one dose (usually in the morning) or in divided doses?  Remember when you take the Zofran (ondansetron) can cause constipation.  Contact Dr. Elyse Hsu regarding the elevated glucose and the increased prednisone dose. You need to drink plenty of water and increase your cardiovascular exercise. Consider Diabetes education and/or nutrition education to help control your glucose. Ask for an update on your thyroid function and vitamin D level with your next labs, if they haven't been done recently.

## 2014-04-11 ENCOUNTER — Ambulatory Visit (INDEPENDENT_AMBULATORY_CARE_PROVIDER_SITE_OTHER): Payer: BC Managed Care – PPO | Admitting: Physician Assistant

## 2014-04-11 VITALS — BP 118/62 | HR 97 | Temp 97.9°F | Resp 16 | Ht 60.0 in | Wt 203.6 lb

## 2014-04-11 DIAGNOSIS — G7000 Myasthenia gravis without (acute) exacerbation: Secondary | ICD-10-CM

## 2014-04-11 NOTE — Progress Notes (Signed)
Subjective:    Patient ID: Michelle Torres, female    DOB: 08/07/1960, 54 y.o.   MRN: [id number]   PCP: [name], MD  Chief Complaint  Patient presents with  . Follow-up    concerning con. prednisone for her mg    Allergies  Allergen Reactions  . Mestinon [Pyridostigmine Bromide] Nausea And Vomiting    "violently ill"  . Risperidone And Related Other (See Comments)    liver damage    Patient Active Problem List   Diagnosis Date Noted  . GERD (gastroesophageal reflux disease) 02/15/2014  . IBS (irritable bowel syndrome) 02/15/2014  . Osteopenia 02/08/2014  . 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 Vitamin D deficiency 08/27/2013  . Hypothyroidism 08/27/2013  . Migraine 08/14/2013  . Bipolar 2 disorder 03/05/2011  . Diabetes mellitus 03/05/2011  . Fatty infiltration of liver 03/02/2011    Prior to Admission medications   Medication Sig Start Date End Date Taking? Authorizing Provider  Calcium Carbonate-Vitamin D (OSCAL 500/200 D-3 PO) Take 2 tablets by mouth daily.    Yes Historical Provider, MD  cetirizine (ZYRTEC) 10 MG tablet Take 10 mg by mouth daily.   Yes Historical Provider, MD  clonazePAM (KLONOPIN) 1 MG tablet Take 4 mg by mouth at bedtime. May take 1/2 to 1 tablet during the day as needed   Yes Historical Provider, MD  divalproex (DEPAKOTE ER) 500 MG 24 hr tablet Take 1,500 mg by mouth at bedtime.    Yes Historical Provider, MD  Dulaglutide (TRULICITY) 5.85 ID/7.8EU SOPN Inject 1.5 mg into the skin once a week.    Yes Historical Provider, MD  eletriptan (RELPAX) 40 MG tablet Take 40 mg by mouth as needed for migraine. One tablet by mouth at onset of headache. May repeat in 2 hours if headache persists or recurs.   Yes Historical Provider, MD  Eluxadoline 100 MG TABS Take 100 mg by mouth 2 (two) times daily.   Yes Historical Provider, MD  fluticasone (FLONASE) 50 MCG/ACT nasal spray Place 2 sprays into the nose as needed for rhinitis.   Yes Historical Provider,  MD  gabapentin (NEURONTIN) 600 MG tablet Take 600 mg by mouth at bedtime.     Yes Historical Provider, MD  insulin glargine (LANTUS) 100 UNIT/ML injection Inject 0.5 mLs (50 Units total) into the skin at bedtime. Increase dose by 2 units every morning until fasting glucose is below 140 mg/dl. start at 40 units every evening and increase as instructed Patient taking differently: Inject 60 Units into the skin at bedtime. Increase dose by 2 units every morning until fasting glucose is below 140 mg/dl. start at 40 units every evening and increase as instructed 09/18/12  Yes Seichi Kaufhold S Toby Breithaupt, PA-C  lamoTRIgine (LAMICTAL) 100 MG tablet Take 200 mg by mouth every morning.    Yes Historical Provider, MD  metFORMIN (GLUCOPHAGE-XR) 500 MG 24 hr tablet Take 1,000 mg by mouth 2 (two) times daily.   Yes Historical Provider, MD  naproxen (NAPROSYN) 500 MG tablet Take 500 mg by mouth 2 (two) times daily as needed (for pain).    Yes Historical Provider, MD  NEEDLE, DISP, 30 G 30G X 3/4" MISC Use as directed, daily blood sugars ( please dispense what patient had previously) 07/14/13  Yes Thao P Le, DO  omeprazole (PRILOSEC) 40 MG capsule Take 1 capsule (40 mg total) by mouth daily. 02/08/14  Yes Olney Monier S Tesneem Dufrane, PA-C  ondansetron (ZOFRAN-ODT) 8 MG disintegrating tablet Take 0.5-1 tablets (4-8 mg  total) by mouth every 8 (eight) hours as needed for nausea. 01/06/14  Yes Demonica Farrey Janalee Dane, PA-C  ONETOUCH VERIO test strip  01/25/14  Yes Historical Provider, MD  OXcarbazepine (TRILEPTAL) 150 MG tablet Take 150 mg by mouth at bedtime.    Yes Historical Provider, MD  pravastatin (PRAVACHOL) 40 MG tablet Take 40 mg by mouth daily.   Yes Historical Provider, MD  predniSONE (DELTASONE) 10 MG tablet Take 10 mg by mouth 2 (two) times daily with a meal.    Yes Historical Provider, MD  pregabalin (LYRICA) 75 MG capsule Take 75 mg by mouth every morning.    Yes Historical Provider, MD  promethazine (PHENERGAN) 25 MG suppository Place 1  suppository (25 mg total) rectally every 6 (six) hours as needed for nausea or vomiting. 01/06/14  Yes Nalani Andreen S Vadhir Mcnay, PA-C  sertraline (ZOLOFT) 25 MG tablet Take 25 mg by mouth daily.   Yes Historical Provider, MD  topiramate (TOPAMAX) 25 MG tablet Take 25 mg by mouth 2 (two) times daily.   Yes Historical Provider, MD    Medical, Surgical, Family and Social History reviewed and updated.  HPI  Presents for form completion regarding disability due to her ocular myasthenia gravis, which causes diplopia and results in dizziness, nausea and vomiting. She is followed at [facility] by ophthalmology and neurology. Sees Dr. Dema Severin again in late March. Currently still on 20 mg prednisone BID.  Review of Systems     Objective:   Physical Exam  Constitutional: She is oriented to person, place, and time. She appears well-developed and well-nourished. She is active and cooperative. No distress.  BP 118/62 mmHg  Pulse 97  Temp(Src) 97.9 F (36.6 C) (Oral)  Resp 16  Ht 5' (1.524 m)  Wt 203 lb 9.6 oz (92.352 kg)  BMI 39.76 kg/m2  SpO2 94%   Eyes: Conjunctivae are normal.  Pulmonary/Chest: Effort normal.  Neurological: She is alert and oriented to person, place, and time.  Skin: Skin is warm and dry.  Psychiatric: She has a normal mood and affect. Her speech is normal and behavior is normal.          Assessment & Plan:  1. Ocular myasthenia gravis Continue follow-up with specialists per their instructions. Forms completed. Will need again in early March, since she won't have been back to see Dr. Dema Severin yet.   Fara Chute, PA-C Physician Assistant-Certified [facility]

## 2014-04-12 ENCOUNTER — Encounter: Payer: Self-pay | Admitting: Physician Assistant

## 2014-04-14 ENCOUNTER — Encounter: Payer: Self-pay | Admitting: Physician Assistant

## 2014-04-26 ENCOUNTER — Encounter: Payer: Self-pay | Admitting: Physician Assistant

## 2014-05-23 ENCOUNTER — Encounter: Payer: Self-pay | Admitting: Physician Assistant

## 2014-05-28 ENCOUNTER — Telehealth: Payer: Self-pay

## 2014-05-28 NOTE — Telephone Encounter (Signed)
LMOM with [facility]. They faxed [place] an OV from 04/27/14 when pt saw Dr. Elyse Hsu. Unable to read second page. Asked them to re-fax report.

## 2014-06-03 ENCOUNTER — Ambulatory Visit (INDEPENDENT_AMBULATORY_CARE_PROVIDER_SITE_OTHER): Payer: BC Managed Care – PPO | Admitting: Physician Assistant

## 2014-06-03 VITALS — BP 112/62 | HR 80 | Temp 98.1°F | Resp 18 | Ht 60.5 in | Wt 200.0 lb

## 2014-06-03 DIAGNOSIS — E119 Type 2 diabetes mellitus without complications: Secondary | ICD-10-CM

## 2014-06-03 DIAGNOSIS — G7000 Myasthenia gravis without (acute) exacerbation: Secondary | ICD-10-CM | POA: Diagnosis not present

## 2014-06-03 NOTE — Progress Notes (Signed)
Subjective:    Patient ID: Michelle Torres, female    DOB: 1960/07/09, 54 y.o.   MRN: [id number]  HPI  Michelle Torres is a 54 year old Caucasian female who presents today for evaluation of occular myasthenia gravis.  She is doing well and has no complaints today. She now wakes up with double vision only one time per week, which is an improvement for her. She did not have double vision today and has no further eye complaints.   She saw her neurologist on 05/07/14 and he decided to wean her off Prednisone and started her on Azathioprine (Imuran). Over the next 6 months she will be weaned off the Prednisone. Since starting the Prednisone, her blood sugars have been elevated and resulted in her Hgb A1c increasing from 6.5 to 7.5. She continues to see the endocrinologist and has an appointment with him 06/30/14. The endocrinologist started her on Duraglutide (Trullicity) to counteract the effects of the Prednisone. She continues to check her blood sugar every morning. She has no side effects from the medications.  She is doing well with her diet and continues to be cognizant of what she eats. She has not met with a dietician but this was discussed with her endocrinologist as a possibility. She is just now starting to get back into exercising and going for 10 minute walks throughout each day.    One week ago she noticed a pain on the right side of her jaw. Felt that it was a little swollen, but the pain and swelling have since resolved and are no longer a complaint.   Her mom was recently diagnosed with cancer at age 70. They are unsure of what the primary cancer is--it may be ovarian or other abdominal cancer.   Review of Systems  Constitutional: Negative for fever, chills, diaphoresis and fatigue.  HENT: Negative for congestion, ear pain, facial swelling, postnasal drip, rhinorrhea, sinus pressure and sore throat.   Eyes: Negative for pain and visual disturbance.  Respiratory: Negative for cough and  shortness of breath.   Cardiovascular: Negative for chest pain and palpitations.  Gastrointestinal: Negative for nausea, vomiting, abdominal pain, diarrhea and constipation.  Genitourinary: Negative for dysuria and frequency.  Neurological: Negative for dizziness, light-headedness and headaches.       Objective:   Physical Exam  Constitutional: She is oriented to person, place, and time. She appears well-developed and well-nourished. No distress.  BP 112/62 mmHg  Pulse 80  Temp(Src) 98.1 F (36.7 C) (Oral)  Resp 18  Ht 5' 0.5" (1.537 m)  Wt 200 lb (90.719 kg)  BMI 38.40 kg/m2  SpO2 95%  HENT:  Head: Normocephalic and atraumatic.  Right Ear: Tympanic membrane, external ear and ear canal normal.  Left Ear: Tympanic membrane, external ear and ear canal normal.  Nose: Nose normal.  Mouth/Throat: Oropharynx is clear and moist. No oropharyngeal exudate.  Eyes: Conjunctivae are normal. Pupils are equal, round, and reactive to light. No scleral icterus.  Neck: Neck supple. No muscular tenderness present.  Cardiovascular: Normal rate, regular rhythm and normal heart sounds.   No murmur heard. Pulmonary/Chest: Effort normal and breath sounds normal. She has no wheezes. She has no rales.  Lymphadenopathy:    She has no cervical adenopathy.  Neurological: She is alert and oriented to person, place, and time.  Skin: Skin is warm and dry.  Psychiatric: She has a normal mood and affect.       Assessment & Plan:  1. Ocular myasthenia gravis -  She is well-controlled on current medication regimen. She follows closely with neurologist and endocrinologist.  - Filled out her disability paperwork for the month. - Will plan to see her on 06/23/14.   2. Diabetes mellitus, type 2 - Regularly sees endocrinology who manages her diabetes. Next appointment with endocrinology is on 06/1114.

## 2014-06-03 NOTE — Progress Notes (Signed)
Patient ID: Michelle Torres, female    DOB: 25-Sep-1960, 54 y.o.   MRN: [id number]  PCP: [name], MD  Subjective:   Chief Complaint  Patient presents with  . Follow-up    myasthenia     HPI Michelle Torres presents for evaluation regarding disability due to myasthenia gravis. She needs the papers completed for the next month. She prefers to come in each month to make sure the forms are completely correctly, completely and on time.  She is doing well and has no complaints today. She now wakes up with double vision only one time per week, which is an improvement for her. She did not have double vision today and has no further eye complaints.   She saw her neurologist on 05/07/14 and he decided to wean her off Prednisone and started her on Azathioprine (Imuran). Over the next 6 months she will be weaned off the Prednisone. Since starting the Prednisone, her blood sugars have been elevated and resulted in her Hgb A1c increasing from 6.5 to 7.5. She continues to see the endocrinologist and has an appointment with him 06/30/14. The endocrinologist started her on Duraglutide (Trullicity) to counteract the effects of the Prednisone. She continues to check her blood sugar every morning. She has no side effects from the medications.   She is doing well with her diet and continues to be cognizant of what she eats. She has not met with a dietician but this was discussed with her endocrinologist as a possibility. She is just now starting to get back into exercising and going for 10 minute walks throughout each day.   One week ago she noticed a pain on the right side of her jaw. Felt that it was a little swollen, but the pain and swelling have since resolved and are no longer a complaint. She has no URI-type symptoms.  Her mom was recently diagnosed with cancer at age 39. They are unsure of what the primary cancer is--it may be ovarian or other abdominal cancer. She is undergoing evaluation in [place], [place]  and expect her treatment to be in [place]. They are waiting on yesterday's biopsy results to determine primary.    Review of Systems Review of Systems  Constitutional: Positive for activity change (working on increased exercise). Negative for fever and chills.  HENT: Negative for congestion, ear pain, nosebleeds, postnasal drip, rhinorrhea, sinus pressure, sneezing and sore throat.   Eyes: Positive for visual disturbance (none today!).  Respiratory: Negative for cough, chest tightness, shortness of breath and wheezing.   Cardiovascular: Negative for chest pain and palpitations.  Gastrointestinal: Negative for nausea, vomiting and diarrhea.  Endocrine: Negative.   Genitourinary: Negative for dysuria, urgency and frequency.  Musculoskeletal: Negative for myalgias and arthralgias.  Skin: Negative for rash.  Neurological: Negative for dizziness, weakness and headaches.  Psychiatric/Behavioral: Negative for dysphoric mood. The patient is not nervous/anxious.        Patient Active Problem List   Diagnosis Date Noted  . GERD (gastroesophageal reflux disease) 02/15/2014  . IBS (irritable bowel syndrome) 02/15/2014  . Osteopenia 02/08/2014  . 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 Vitamin D deficiency 08/27/2013  . Hypothyroidism 08/27/2013  . Migraine 08/14/2013  . Bipolar 2 disorder 03/05/2011  . Diabetes mellitus 03/05/2011  . Fatty infiltration of liver 03/02/2011     Prior to Admission medications   Medication Sig Start Date End Date Taking? Authorizing Provider  azaTHIOprine (IMURAN) 50 MG tablet Take 50 mg by mouth 2 (two) times  daily.   Yes Historical Provider, MD  Calcium Carbonate-Vitamin D (OSCAL 500/200 D-3 PO) Take 2 tablets by mouth daily.    Yes Historical Provider, MD  cetirizine (ZYRTEC) 10 MG tablet Take 10 mg by mouth daily.   Yes Historical Provider, MD  clonazePAM (KLONOPIN) 1 MG tablet Take 4 mg by mouth at bedtime. May take 1/2 to 1 tablet during the day as  needed   Yes Historical Provider, MD  divalproex (DEPAKOTE ER) 500 MG 24 hr tablet Take 1,500 mg by mouth at bedtime.    Yes Historical Provider, MD  Dulaglutide (TRULICITY) 9.83 JA/2.5KN SOPN Inject 1.5 mg into the skin once a week.    Yes Historical Provider, MD  eletriptan (RELPAX) 40 MG tablet Take 40 mg by mouth as needed for migraine. One tablet by mouth at onset of headache. May repeat in 2 hours if headache persists or recurs.   Yes Historical Provider, MD  Eluxadoline 100 MG TABS Take 100 mg by mouth 2 (two) times daily.   Yes Historical Provider, MD  fluticasone (FLONASE) 50 MCG/ACT nasal spray Place 2 sprays into the nose as needed for rhinitis.   Yes Historical Provider, MD  gabapentin (NEURONTIN) 600 MG tablet Take 600 mg by mouth at bedtime.     Yes Historical Provider, MD  Insulin Lispro, Human, 200 UNIT/ML SOPN Inject into the skin.   Yes Historical Provider, MD  lamoTRIgine (LAMICTAL) 100 MG tablet Take 200 mg by mouth every morning.    Yes Historical Provider, MD  LANTUS SOLOSTAR 100 UNIT/ML Solostar Pen  05/20/14  Yes Historical Provider, MD  metFORMIN (GLUCOPHAGE-XR) 500 MG 24 hr tablet Take 1,000 mg by mouth 2 (two) times daily.   Yes Historical Provider, MD  naproxen (NAPROSYN) 500 MG tablet Take 500 mg by mouth 2 (two) times daily as needed (for pain).    Yes Historical Provider, MD  NEEDLE, DISP, 30 G 30G X 3/4" MISC Use as directed, daily blood sugars ( please dispense what patient had previously) 07/14/13  Yes Thao P Le, DO  omeprazole (PRILOSEC) 40 MG capsule Take 1 capsule (40 mg total) by mouth daily. 02/08/14  Yes Wesly Whisenant S Vedh Ptacek, PA-C  ondansetron (ZOFRAN-ODT) 8 MG disintegrating tablet Take 0.5-1 tablets (4-8 mg total) by mouth every 8 (eight) hours as needed for nausea. 01/06/14  Yes Autumn Pruitt Janalee Dane, PA-C  ONETOUCH VERIO test strip  01/25/14  Yes Historical Provider, MD  OXcarbazepine (TRILEPTAL) 150 MG tablet Take 150 mg by mouth at bedtime.    Yes Historical  Provider, MD  pravastatin (PRAVACHOL) 40 MG tablet Take 40 mg by mouth daily.   Yes Historical Provider, MD  predniSONE (DELTASONE) 10 MG tablet Take 10 mg by mouth 2 (two) times daily with a meal.    Yes Historical Provider, MD  pregabalin (LYRICA) 75 MG capsule Take 75 mg by mouth every morning.    Yes Historical Provider, MD  promethazine (PHENERGAN) 25 MG suppository Place 1 suppository (25 mg total) rectally every 6 (six) hours as needed for nausea or vomiting. 01/06/14  Yes Ishana Blades S Annalynne Ibanez, PA-C  sertraline (ZOLOFT) 25 MG tablet Take 25 mg by mouth daily.   Yes Historical Provider, MD  topiramate (TOPAMAX) 25 MG tablet Take 25 mg by mouth 2 (two) times daily.   Yes Historical Provider, MD     Allergies  Allergen Reactions  . Mestinon [Pyridostigmine Bromide] Nausea And Vomiting    "violently ill"  . Risperidone And Related Other (See Comments)  liver damage       Objective:  Physical Exam  Physical Exam  Constitutional: She is oriented to person, place, and time. Vital signs are normal. She appears well-developed and well-nourished. She is active and cooperative. No distress.  BP 112/62 mmHg  Pulse 80  Temp(Src) 98.1 F (36.7 C) (Oral)  Resp 18  Ht 5' 0.5" (1.537 m)  Wt 200 lb (90.719 kg)  BMI 38.40 kg/m2  SpO2 95%  HENT:  Head: Normocephalic and atraumatic.  Right Ear: Hearing, tympanic membrane, external ear and ear canal normal.  Left Ear: Hearing, tympanic membrane, external ear and ear canal normal.  Eyes: Conjunctivae are normal. No scleral icterus.  Neck: Normal range of motion, full passive range of motion without pain and phonation normal. Neck supple. No spinous process tenderness and no muscular tenderness present. Normal range of motion present. No thyromegaly present.  Cardiovascular: Normal rate, regular rhythm and normal heart sounds.   Pulses:      Radial pulses are 2+ on the right side, and 2+ on the left side.  Pulmonary/Chest: Effort normal and  breath sounds normal.  Lymphadenopathy:       Head (right side): No tonsillar, no preauricular, no posterior auricular and no occipital adenopathy present.       Head (left side): No tonsillar, no preauricular, no posterior auricular and no occipital adenopathy present.    She has no cervical adenopathy.       Right: No supraclavicular adenopathy present.       Left: No supraclavicular adenopathy present.  Neurological: She is alert and oriented to person, place, and time. No sensory deficit.  Skin: Skin is warm, dry and intact. No rash noted. No cyanosis or erythema. Nails show no clubbing.  Psychiatric: She has a normal mood and affect. Her speech is normal and behavior is normal.           Assessment & Plan:  1. Ocular myasthenia gravis Continue treatment per specialists. If her symptoms continue to improve, she'd like to go back to work! Forms completed.   Fara Chute, PA-C Physician Assistant-Certified [facility]

## 2014-06-03 NOTE — Patient Instructions (Signed)
Wednesday 5/04 12 noon-8:30 pm

## 2014-06-23 ENCOUNTER — Ambulatory Visit (INDEPENDENT_AMBULATORY_CARE_PROVIDER_SITE_OTHER): Payer: BC Managed Care – PPO | Admitting: Physician Assistant

## 2014-06-23 DIAGNOSIS — G7000 Myasthenia gravis without (acute) exacerbation: Secondary | ICD-10-CM | POA: Diagnosis not present

## 2014-06-23 NOTE — Progress Notes (Signed)
   Subjective:    Patient ID: Michelle Torres, female    DOB: Jul 02, 1960, 54 y.o.   MRN: [id number]  HPI  Michelle Torres is a 54 year old female who presents today for follow-up of paperwork.  She has no complaints today. She is here with her husband who is being see. She needs her monthly paperwork filled out for disability. She prefers to come in each month to make sure the paperwork is filled out appropriately and on time.    At her last visit on 06/03/14, her mom was recently diagnosed with cancer. Says her mom is doing well, and that they discovered it was ovarian cancer with metastases to her peritoneal cavity, stomach, and omentum. She has been under stress lately in regards to her mothers diagnosis, but says that her mom is doing well at this time.   Review of Systems  Constitutional: Negative.   HENT: Negative.   Eyes: Negative.   Respiratory: Negative.   Cardiovascular: Negative.   Gastrointestinal: Negative.   Endocrine: Negative.   Genitourinary: Negative.   Musculoskeletal: Negative.   Skin: Negative.   Neurological: Negative.        Objective:   Physical Exam  Constitutional: She is oriented to person, place, and time. She appears well-developed and well-nourished. No distress.  There were no vitals taken for this visit.  HENT:  Head: Normocephalic and atraumatic.  Eyes: Conjunctivae are normal.  Neck: Neck supple.  Cardiovascular: Normal rate and regular rhythm.  Exam reveals no gallop and no friction rub.   No murmur heard. Pulmonary/Chest: Effort normal and breath sounds normal. She has no wheezes. She has no rhonchi. She has no rales.  Lymphadenopathy:       Head (right side): No submental, no submandibular and no tonsillar adenopathy present.       Head (left side): No submental, no submandibular and no tonsillar adenopathy present.    She has no cervical adenopathy.       Right: No supraclavicular adenopathy present.       Left: No supraclavicular adenopathy  present.  Neurological: She is alert and oriented to person, place, and time.  Skin: Skin is warm and dry.  Psychiatric: She has a normal mood and affect. Her behavior is normal.      Assessment & Plan:  1. Ocular myasthenia gravis Stable.  Monthly paperwork filled out for disability. She brought in a blank copy of her paperwork to keep on file.

## 2014-06-24 ENCOUNTER — Encounter: Payer: Self-pay | Admitting: Physician Assistant

## 2014-06-24 NOTE — Progress Notes (Signed)
Patient ID: Michelle Torres, female    DOB: Jun 20, 1960, 54 y.o.   MRN: [id number]  PCP: [name], MD  Subjective:   Chief Complaint  Patient presents with  . Follow-up    paper work, diabetes discussion    HPI Presents for paperwork completion.  She has no complaints today. She is here with her husband who is being see. She needs her monthly paperwork filled out for disability. She prefers to come in each month to make sure the paperwork is filled out appropriately and on time.   At her last visit on 06/03/14, her mom was recently diagnosed with cancer. Says her mom is doing well, and that they discovered it was ovarian cancer with metastases to her peritoneal cavity, stomach, and omentum. She has been under stress lately in regards to her mothers diagnosis, but says that her mom is doing well at this time.    Review of Systems  Constitutional: Negative for fever, chills, activity change, appetite change and fatigue.  HENT: Negative for congestion, ear pain, rhinorrhea and sore throat.   Eyes: Positive for visual disturbance (stable-episodic diplopia).  Respiratory: Negative for cough, chest tightness and shortness of breath.   Cardiovascular: Negative for chest pain and palpitations.  Gastrointestinal: Negative for nausea, vomiting and diarrhea.  Endocrine: Negative for cold intolerance, heat intolerance, polydipsia, polyphagia and polyuria.  Skin: Negative for rash.       Patient Active Problem List   Diagnosis Date Noted  . GERD (gastroesophageal reflux disease) 02/15/2014  . IBS (irritable bowel syndrome) 02/15/2014  . Osteopenia 02/08/2014  . 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 Vitamin D deficiency 08/27/2013  . Hypothyroidism 08/27/2013  . Migraine 08/14/2013  . Bipolar 2 disorder 03/05/2011  . Diabetes mellitus 03/05/2011  . Fatty infiltration of liver 03/02/2011     Prior to Admission medications   Medication Sig Start Date End Date Taking?  Authorizing Provider  azaTHIOprine (IMURAN) 50 MG tablet Take 50 mg by mouth 2 (two) times daily.   Yes Historical Provider, MD  Calcium Carbonate-Vitamin D (OSCAL 500/200 D-3 PO) Take 2 tablets by mouth daily.    Yes Historical Provider, MD  cetirizine (ZYRTEC) 10 MG tablet Take 10 mg by mouth daily.   Yes Historical Provider, MD  clonazePAM (KLONOPIN) 1 MG tablet Take 4 mg by mouth at bedtime. May take 1/2 to 1 tablet during the day as needed   Yes Historical Provider, MD  divalproex (DEPAKOTE ER) 500 MG 24 hr tablet Take 1,500 mg by mouth at bedtime.    Yes Historical Provider, MD  Dulaglutide (TRULICITY) 1.60 FU/9.3AT SOPN Inject 1.5 mg into the skin once a week.    Yes Historical Provider, MD  eletriptan (RELPAX) 40 MG tablet Take 40 mg by mouth as needed for migraine. One tablet by mouth at onset of headache. May repeat in 2 hours if headache persists or recurs.   Yes Historical Provider, MD  Eluxadoline 100 MG TABS Take 100 mg by mouth 2 (two) times daily.   Yes Historical Provider, MD  fluticasone (FLONASE) 50 MCG/ACT nasal spray Place 2 sprays into the nose as needed for rhinitis.   Yes Historical Provider, MD  gabapentin (NEURONTIN) 600 MG tablet Take 600 mg by mouth at bedtime.     Yes Historical Provider, MD  Insulin Lispro, Human, 200 UNIT/ML SOPN Inject into the skin.   Yes Historical Provider, MD  lamoTRIgine (LAMICTAL) 100 MG tablet Take 200 mg by mouth every morning.  Yes Historical Provider, MD  LANTUS SOLOSTAR 100 UNIT/ML Solostar Pen  05/20/14  Yes Historical Provider, MD  metFORMIN (GLUCOPHAGE-XR) 500 MG 24 hr tablet Take 1,000 mg by mouth 2 (two) times daily.   Yes Historical Provider, MD  naproxen (NAPROSYN) 500 MG tablet Take 500 mg by mouth 2 (two) times daily as needed (for pain).    Yes Historical Provider, MD  NEEDLE, DISP, 30 G 30G X 3/4" MISC Use as directed, daily blood sugars ( please dispense what patient had previously) 07/14/13  Yes Thao P Le, DO  omeprazole  (PRILOSEC) 40 MG capsule Take 1 capsule (40 mg total) by mouth daily. 02/08/14  Yes Berdie Malter S Margarine Grosshans, PA-C  ondansetron (ZOFRAN-ODT) 8 MG disintegrating tablet Take 0.5-1 tablets (4-8 mg total) by mouth every 8 (eight) hours as needed for nausea. 01/06/14  Yes Devean Skoczylas Janalee Dane, PA-C  ONETOUCH VERIO test strip  01/25/14  Yes Historical Provider, MD  OXcarbazepine (TRILEPTAL) 150 MG tablet Take 150 mg by mouth at bedtime.    Yes Historical Provider, MD  pravastatin (PRAVACHOL) 40 MG tablet Take 40 mg by mouth daily.   Yes Historical Provider, MD  predniSONE (DELTASONE) 10 MG tablet Take 10 mg by mouth 2 (two) times daily with a meal.    Yes Historical Provider, MD  pregabalin (LYRICA) 75 MG capsule Take 75 mg by mouth every morning.    Yes Historical Provider, MD  promethazine (PHENERGAN) 25 MG suppository Place 1 suppository (25 mg total) rectally every 6 (six) hours as needed for nausea or vomiting. 01/06/14  Yes Providencia Hottenstein S Dalaney Needle, PA-C  sertraline (ZOLOFT) 25 MG tablet Take 25 mg by mouth daily.   Yes Historical Provider, MD  topiramate (TOPAMAX) 25 MG tablet Take 25 mg by mouth 2 (two) times daily.   Yes Historical Provider, MD     Allergies  Allergen Reactions  . Mestinon [Pyridostigmine Bromide] Nausea And Vomiting    "violently ill"  . Risperidone And Related Other (See Comments)    liver damage       Objective:  Physical Exam  Constitutional: She is oriented to person, place, and time. She appears well-developed and well-nourished. She is active and cooperative. No distress.  There were no vitals taken for this visit.   Eyes: Conjunctivae are normal.  Pulmonary/Chest: Effort normal.  Neurological: She is alert and oriented to person, place, and time.  Skin: Skin is warm and dry.  Psychiatric: She has a normal mood and affect. Her speech is normal and behavior is normal.           Assessment & Plan:   1. Ocular myasthenia gravis Stable. Continue follow-up with specialists  per their recommendations.   Fara Chute, PA-C Physician Assistant-Certified [facility] .

## 2014-06-30 LAB — HEMOGLOBIN A1C: Hgb A1c MFr Bld: 7.8 % — AB (ref 4.0–6.0)

## 2014-06-30 LAB — HM DIABETES FOOT EXAM

## 2014-07-21 ENCOUNTER — Encounter: Payer: Self-pay | Admitting: Physician Assistant

## 2014-07-28 ENCOUNTER — Encounter: Payer: Self-pay | Admitting: Physician Assistant

## 2014-07-30 ENCOUNTER — Ambulatory Visit (INDEPENDENT_AMBULATORY_CARE_PROVIDER_SITE_OTHER): Payer: BC Managed Care – PPO | Admitting: Physician Assistant

## 2014-07-30 ENCOUNTER — Encounter: Payer: Self-pay | Admitting: Physician Assistant

## 2014-07-30 VITALS — BP 118/64 | HR 88 | Temp 98.2°F | Resp 18 | Ht 60.0 in | Wt 195.6 lb

## 2014-07-30 DIAGNOSIS — R6884 Jaw pain: Secondary | ICD-10-CM

## 2014-07-30 DIAGNOSIS — E119 Type 2 diabetes mellitus without complications: Secondary | ICD-10-CM | POA: Diagnosis not present

## 2014-07-30 DIAGNOSIS — G7000 Myasthenia gravis without (acute) exacerbation: Secondary | ICD-10-CM | POA: Diagnosis not present

## 2014-07-30 NOTE — Progress Notes (Signed)
Patient ID: Michelle Torres, female    DOB: 1960/03/18, 54 y.o.   MRN: [id number]  PCP: [name], MD  Chief Complaint  Patient presents with  . Paperwork    Needs to have paperwork filled out    Subjective:  HPI  Needs paperwork completed for disability/FMLA Feeling great today, better than usual.   She continues to have double vision 1-2x per week, but she says it is getting better. It can last for a few hours or the whole day. She is being followed by neurology, their plan is to wean her Prednisone down as they increase her Imuran to control her symptoms so that she is able to return to work. Her next appointment with neurology is 08/06/14.   She has also been having some bilateral jaw pain around the TMJ that she began noticing about 2 months ago, shortly after starting prednisone. It has gotten more frequent where it is now happening 1x/week over the last couple of weeks. It usually lasts 1-2 hours, and she uses ice and ibuprofen to treat it, with relief. She has informed her neurologist about the jaw pain, and she states they cannot explain it. She has not seen a dentist.   Pt has no complaints related to her diabetes, which is being managed by her endocrinologist. She is tolerating her current regimen, which includes Trulicity that was added to counteract her glucose increase d/t Prednisone. Her last visit with her endocrinologist was 06/30/2014. A diabetic foot exam was performed and her HgA1c was 7.8. Her next appointment is in July.   She feels as though her stress is getting better, but is still very concerned about her mother, who was diagnosed with ovarian cancer and has been in the hospital twice recently and is getting a port a cath put in next week to begin chemo.   Review of Systems     Patient Active Problem List   Diagnosis Date Noted  . Jaw pain 07/30/2014  . GERD (gastroesophageal reflux disease) 02/15/2014  . IBS (irritable bowel syndrome) 02/15/2014  .  Osteopenia 02/08/2014  . 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 Vitamin D deficiency 08/27/2013  . Hypothyroidism 08/27/2013  . Migraine 08/14/2013  . Bipolar 2 disorder 03/05/2011  . Diabetes mellitus 03/05/2011  . Fatty infiltration of liver 03/02/2011    Allergies  Allergen Reactions  . Mestinon [Pyridostigmine Bromide] Nausea And Vomiting    "violently ill"  . Risperidone And Related Other (See Comments)    liver damage    Prior to Admission medications   Medication Sig Start Date End Date Taking? Authorizing Provider  azaTHIOprine (IMURAN) 50 MG tablet Take 50 mg by mouth 2 (two) times daily.   Yes Historical Provider, MD  Calcium Carbonate-Vitamin D (OSCAL 500/200 D-3 PO) Take 2 tablets by mouth daily.    Yes Historical Provider, MD  cetirizine (ZYRTEC) 10 MG tablet Take 10 mg by mouth daily.   Yes Historical Provider, MD  clonazePAM (KLONOPIN) 1 MG tablet Take 4 mg by mouth at bedtime. May take 1/2 to 1 tablet during the day as needed   Yes Historical Provider, MD  divalproex (DEPAKOTE ER) 500 MG 24 hr tablet Take 1,500 mg by mouth at bedtime.    Yes Historical Provider, MD  Dulaglutide (TRULICITY) 4.09 WJ/1.9JY SOPN Inject 1.5 mg into the skin once a week.    Yes Historical Provider, MD  eletriptan (RELPAX) 40 MG tablet Take 40 mg by mouth as needed for migraine. One tablet  by mouth at onset of headache. May repeat in 2 hours if headache persists or recurs.   Yes Historical Provider, MD  Eluxadoline 100 MG TABS Take 100 mg by mouth 2 (two) times daily.   Yes Historical Provider, MD  fluticasone (FLONASE) 50 MCG/ACT nasal spray Place 2 sprays into the nose as needed for rhinitis.   Yes Historical Provider, MD  gabapentin (NEURONTIN) 600 MG tablet Take 600 mg by mouth at bedtime.     Yes Historical Provider, MD  Insulin Lispro, Human, 200 UNIT/ML SOPN Inject into the skin.   Yes Historical Provider, MD  lamoTRIgine (LAMICTAL) 100 MG tablet Take 200 mg by mouth every morning.     Yes Historical Provider, MD  LANTUS SOLOSTAR 100 UNIT/ML Solostar Pen  05/20/14  Yes Historical Provider, MD  metFORMIN (GLUCOPHAGE-XR) 500 MG 24 hr tablet Take 1,000 mg by mouth 2 (two) times daily.   Yes Historical Provider, MD  naproxen (NAPROSYN) 500 MG tablet Take 500 mg by mouth 2 (two) times daily as needed (for pain).    Yes Historical Provider, MD  NEEDLE, DISP, 30 G 30G X 3/4" MISC Use as directed, daily blood sugars ( please dispense what patient had previously) 07/14/13  Yes Thao P Le, DO  omeprazole (PRILOSEC) 40 MG capsule Take 1 capsule (40 mg total) by mouth daily. 02/08/14  Yes Gaylen Pereira, PA-C  ondansetron (ZOFRAN-ODT) 8 MG disintegrating tablet Take 0.5-1 tablets (4-8 mg total) by mouth every 8 (eight) hours as needed for nausea. 01/06/14  Yes Karolyna Bianchini, PA-C  ONETOUCH VERIO test strip  01/25/14  Yes Historical Provider, MD  OXcarbazepine (TRILEPTAL) 150 MG tablet Take 150 mg by mouth at bedtime.    Yes Historical Provider, MD  pravastatin (PRAVACHOL) 40 MG tablet Take 40 mg by mouth daily.   Yes Historical Provider, MD  predniSONE (DELTASONE) 10 MG tablet Take 10 mg by mouth 2 (two) times daily with a meal.    Yes Historical Provider, MD  pregabalin (LYRICA) 75 MG capsule Take 75 mg by mouth every morning.    Yes Historical Provider, MD  promethazine (PHENERGAN) 25 MG suppository Place 1 suppository (25 mg total) rectally every 6 (six) hours as needed for nausea or vomiting. 01/06/14  Yes Estel Scholze, PA-C  sertraline (ZOLOFT) 25 MG tablet Take 25 mg by mouth daily.   Yes Historical Provider, MD  topiramate (TOPAMAX) 25 MG tablet Take 25 mg by mouth 2 (two) times daily.   Yes Historical Provider, MD     Past Medical, Surgical Family and Social History reviewed and updated.        Objective:  Physical Exam  Constitutional: She is oriented to person, place, and time. She appears well-developed and well-nourished. She is active and cooperative. No distress.  BP  118/64 mmHg  Pulse 88  Temp(Src) 98.2 F (36.8 C) (Oral)  Resp 18  Ht 5' (1.524 m)  Wt 195 lb 9.6 oz (88.724 kg)  BMI 38.20 kg/m2  SpO2 98%   Eyes: Conjunctivae are normal.  Pulmonary/Chest: Effort normal.  Neurological: She is alert and oriented to person, place, and time.  Psychiatric: She has a normal mood and affect. Her speech is normal and behavior is normal.           Assessment & Plan:  1. Ocular myasthenia gravis Continue with neurology and ophthalmology.  2. Jaw pain Proceed with dental specialist evaluation.  3. Type 2 diabetes mellitus without complication Continue follow-up with endocrinology.  Forms completed  and copied for scanning.   Fara Chute, PA-C Physician Assistant-Certified [facility]

## 2014-07-30 NOTE — Progress Notes (Signed)
Patient ID: Michelle Torres, female    DOB: 01-26-1961, 54 y.o.   MRN: [id number]  PCP: [name], MD  Chief Complaint  Patient presents with  . Paperwork    Needs to have paperwork filled out     Subjective:  HPI Pt is a 54 y/o female presenting for disability paperwork completion.  She states that she is feeling great today, better than usual.   She continues to have double vision 1-2x per week, but she says it is getting better. It can last for a few hours or the whole day. She is being followed by neurology, their plan is to wean her Prednisone down as they increase her Imuran to control her symptoms so that she is able to return to work. Her next appointment with neurology is 08/06/14.   She has also been having some bilateral jaw pain around the TMJ that she began noticing about 2 months ago, shortly after starting prednisone. It has gotten more frequent where it is now happening 1x/week over the last couple of weeks. It usually lasts 1-2 hours, and she uses ice and ibuprofen to treat it, with relief. She has informed her neurologist about the jaw pain, and she states they cannot explain it. She has not seen a dentist.  Pt has no complaints related to her diabetes, which is being managed by her endocrinologist. She is tolerating her current regimen, which includes Trulicity that was added at a previous visit to counteract her glucose increase d/t Prednisone. Her last visit with her endocrinologist was 06/30/2014. A diabetic foot exam was performed and her HgA1c was 7.8. Her next appointment is in July.   She feels as though her stress is getting better, but is still very concerned about her mother, who was diagnosed with ovarian cancer and has been in the hospital twice recently and is getting a port a cath put in next week to begin chemo.  Review of Systems  Constitutional: Negative for fever, chills, appetite change, fatigue and unexpected weight change.  HENT: Negative for dental  problem, ear discharge, ear pain, sinus pressure and trouble swallowing.        Bilateral jaw pain  Eyes: Positive for visual disturbance (Recurrent episodes of diplopia d/t ocula rmyasthenia gravis - not experiencing this today). Negative for photophobia, pain, discharge, redness and itching.  Respiratory: Negative.   Cardiovascular: Negative.   Gastrointestinal: Negative.   Endocrine: Negative.   Neurological: Negative.      Patient Active Problem List   Diagnosis Date Noted  . Jaw pain 07/30/2014  . GERD (gastroesophageal reflux disease) 02/15/2014  . IBS (irritable bowel syndrome) 02/15/2014  . Osteopenia 02/08/2014  . 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 Vitamin D deficiency 08/27/2013  . Hypothyroidism 08/27/2013  . Migraine 08/14/2013  . Bipolar 2 disorder 03/05/2011  . Diabetes mellitus 03/05/2011  . Fatty infiltration of liver 03/02/2011    Past Medical History  Diagnosis Date  . Diabetes mellitus   . Bipolar disorder   . High cholesterol   . Fatty liver disease, nonalcoholic   . Depression   . GERD (gastroesophageal reflux disease)   . Anxiety     Prior to Admission medications   Medication Sig Start Date End Date Taking? Authorizing Provider  azaTHIOprine (IMURAN) 50 MG tablet Take 50 mg by mouth 2 (two) times daily.    Historical Provider, MD  Calcium Carbonate-Vitamin D (OSCAL 500/200 D-3 PO) Take 2 tablets by mouth daily.     Historical  Provider, MD  cetirizine (ZYRTEC) 10 MG tablet Take 10 mg by mouth daily.    Historical Provider, MD  clonazePAM (KLONOPIN) 1 MG tablet Take 4 mg by mouth at bedtime. May take 1/2 to 1 tablet during the day as needed    Historical Provider, MD  divalproex (DEPAKOTE ER) 500 MG 24 hr tablet Take 1,500 mg by mouth at bedtime.     Historical Provider, MD  Dulaglutide (TRULICITY) 6.07 PX/1.0GY SOPN Inject 1.5 mg into the skin once a week.     Historical Provider, MD  eletriptan (RELPAX) 40 MG tablet Take 40 mg by mouth as needed  for migraine. One tablet by mouth at onset of headache. May repeat in 2 hours if headache persists or recurs.    Historical Provider, MD  Eluxadoline 100 MG TABS Take 100 mg by mouth 2 (two) times daily.    Historical Provider, MD  fluticasone (FLONASE) 50 MCG/ACT nasal spray Place 2 sprays into the nose as needed for rhinitis.    Historical Provider, MD  gabapentin (NEURONTIN) 600 MG tablet Take 600 mg by mouth at bedtime.      Historical Provider, MD  Insulin Lispro, Human, 200 UNIT/ML SOPN Inject into the skin.    Historical Provider, MD  lamoTRIgine (LAMICTAL) 100 MG tablet Take 200 mg by mouth every morning.     Historical Provider, MD  LANTUS SOLOSTAR 100 UNIT/ML Solostar Pen  05/20/14   Historical Provider, MD  metFORMIN (GLUCOPHAGE-XR) 500 MG 24 hr tablet Take 1,000 mg by mouth 2 (two) times daily.    Historical Provider, MD  naproxen (NAPROSYN) 500 MG tablet Take 500 mg by mouth 2 (two) times daily as needed (for pain).     Historical Provider, MD  NEEDLE, DISP, 30 G 30G X 3/4" MISC Use as directed, daily blood sugars ( please dispense what patient had previously) 07/14/13   Thao P Le, DO  omeprazole (PRILOSEC) 40 MG capsule Take 1 capsule (40 mg total) by mouth daily. 02/08/14   Chelle Jeffery, PA-C  ondansetron (ZOFRAN-ODT) 8 MG disintegrating tablet Take 0.5-1 tablets (4-8 mg total) by mouth every 8 (eight) hours as needed for nausea. 01/06/14   Harrison Mons, PA-C  ONETOUCH VERIO test strip  01/25/14   Historical Provider, MD  OXcarbazepine (TRILEPTAL) 150 MG tablet Take 150 mg by mouth at bedtime.     Historical Provider, MD  pravastatin (PRAVACHOL) 40 MG tablet Take 40 mg by mouth daily.    Historical Provider, MD  predniSONE (DELTASONE) 10 MG tablet Take 10 mg by mouth 2 (two) times daily with a meal.     Historical Provider, MD  pregabalin (LYRICA) 75 MG capsule Take 75 mg by mouth every morning.     Historical Provider, MD  promethazine (PHENERGAN) 25 MG suppository Place 1  suppository (25 mg total) rectally every 6 (six) hours as needed for nausea or vomiting. 01/06/14   Chelle Jeffery, PA-C  sertraline (ZOLOFT) 25 MG tablet Take 25 mg by mouth daily.    Historical Provider, MD  topiramate (TOPAMAX) 25 MG tablet Take 25 mg by mouth 2 (two) times daily.    Historical Provider, MD    Allergies  Allergen Reactions  . Mestinon [Pyridostigmine Bromide] Nausea And Vomiting    "violently ill"  . Risperidone And Related Other (See Comments)    liver damage    Past Medical, Surgical Family and Social History reviewed and updated.   Objective:   Vitals: BP 118/64 mmHg  Pulse 88  Temp(Src) 98.2 F (36.8 C) (Oral)  Resp 18  Ht 5' (1.524 m)  Wt 195 lb 9.6 oz (88.724 kg)  BMI 38.20 kg/m2  SpO2 98%   Physical Exam  Constitutional: She is oriented to person, place, and time. She appears well-developed and well-nourished. She is cooperative. No distress.  HENT:  Head: Normocephalic and atraumatic.  Right Ear: External ear normal.  Mouth/Throat: Uvula is midline, oropharynx is clear and moist and mucous membranes are normal. Normal dentition.  No jaw tenderness or TMJ clicking today.  Eyes: Conjunctivae, EOM and lids are normal. Pupils are equal, round, and reactive to light.  Cardiovascular: Normal rate, regular rhythm and normal heart sounds.  Exam reveals no gallop and no friction rub.   No murmur heard. Pulmonary/Chest: Effort normal and breath sounds normal. No respiratory distress. She has no wheezes. She has no rales.  Lymphadenopathy:       Head (right side): No submental, no submandibular, no tonsillar, no preauricular, no posterior auricular and no occipital adenopathy present.       Head (left side): No submental, no submandibular, no tonsillar, no preauricular, no posterior auricular and no occipital adenopathy present.    She has no cervical adenopathy.       Right: No supraclavicular adenopathy present.       Left: No supraclavicular adenopathy  present.  Neurological: She is alert and oriented to person, place, and time.  Skin: Skin is warm, dry and intact.  Psychiatric: She has a normal mood and affect. Her speech is normal and behavior is normal.    Assessment & Plan:   Markea was seen today for disability paperwork.  Diagnoses and all orders for this visit:  Ocular myasthenia gravis - Stable. Continue seeing neurology and following their regimen.  Jaw pain -  Continue ibuprofen and ice prn. -  Encouraged to see a dentist  Type 2 diabetes mellitus without complication  -  Continue seeing endocrinologist for management.    Erek Kowal, PA-S [facility] 07/30/2014 9:16 AM

## 2014-08-05 NOTE — Progress Notes (Signed)
  Medical screening examination/treatment/procedure(s) were performed by non-physician practitioner and as supervising physician I was immediately available for consultation/collaboration.     

## 2014-08-12 ENCOUNTER — Encounter: Payer: Self-pay | Admitting: *Deleted

## 2014-08-20 ENCOUNTER — Encounter: Payer: Self-pay | Admitting: Physician Assistant

## 2014-08-23 ENCOUNTER — Ambulatory Visit (INDEPENDENT_AMBULATORY_CARE_PROVIDER_SITE_OTHER): Payer: BC Managed Care – PPO | Admitting: Physician Assistant

## 2014-08-23 VITALS — BP 94/68 | HR 99 | Temp 98.3°F | Resp 18 | Ht 60.0 in | Wt 196.8 lb

## 2014-08-23 DIAGNOSIS — G7000 Myasthenia gravis without (acute) exacerbation: Secondary | ICD-10-CM | POA: Diagnosis not present

## 2014-08-23 DIAGNOSIS — Z114 Encounter for screening for human immunodeficiency virus [HIV]: Secondary | ICD-10-CM

## 2014-08-23 NOTE — Progress Notes (Deleted)
   Subjective:    Patient ID: Michelle Torres, female    DOB: Oct 03, 1960, 54 y.o.   MRN: [id number]  HPI    Review of Systems     Objective:   Physical Exam        Assessment & Plan:

## 2014-08-23 NOTE — Progress Notes (Signed)
Patient ID: Michelle Torres, female    DOB: 08/27/1960, 54 y.o.   MRN: [id number]  PCP: [name], MD  Subjective:   Chief Complaint  Patient presents with  . Follow-up    Ocular myasthenia gravis, had 2 teeth extracted last week so jaw pain is gone  . Blood Work    Was told she needed HIV test    HPI Presents for evaluation of status for disability  Continues to improve. Less double vision. Tracking episodes now, frequency and duration of symptoms, following while treatment is maximized. At her last visit with neurology they reduced prednisone, increased Imuran. Follow-up with neurology in September, Dr. Gelene Mink, who is replacing Dr. Dema Severin (who has completed his Fellowship).  Also, she'd like to go ahead and have HIV screening, per CDC recommendations.  Her recent jaw pain turned out to be due to two cracked teeth, which she had removed last week.  Review of Systems     Patient Active Problem List   Diagnosis Date Noted  . Jaw pain 07/30/2014  . GERD (gastroesophageal reflux disease) 02/15/2014  . IBS (irritable bowel syndrome) 02/15/2014  . Osteopenia 02/08/2014  . 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 Vitamin D deficiency 08/27/2013  . Hypothyroidism 08/27/2013  . Migraine 08/14/2013  . Bipolar 2 disorder 03/05/2011  . Diabetes mellitus 03/05/2011  . Fatty infiltration of liver 03/02/2011     Prior to Admission medications   Medication Sig Start Date End Date Taking? Authorizing Provider  azaTHIOprine (IMURAN) 50 MG tablet Take 50 mg by mouth 3 (three) times daily.    Yes Historical Provider, MD  Calcium Carbonate-Vitamin D (OSCAL 500/200 D-3 PO) Take 2 tablets by mouth daily.    Yes Historical Provider, MD  cetirizine (ZYRTEC) 10 MG tablet Take 10 mg by mouth daily.   Yes Historical Provider, MD  clonazePAM (KLONOPIN) 1 MG tablet Take 4 mg by mouth at bedtime. May take 1/2 to 1 tablet during the day as needed   Yes Historical Provider, MD  divalproex  (DEPAKOTE ER) 500 MG 24 hr tablet Take 1,500 mg by mouth at bedtime.    Yes Historical Provider, MD  Dulaglutide (TRULICITY) 0.56 PV/9.4IA SOPN Inject 1.5 mg into the skin once a week.    Yes Historical Provider, MD  eletriptan (RELPAX) 40 MG tablet Take 40 mg by mouth as needed for migraine. One tablet by mouth at onset of headache. May repeat in 2 hours if headache persists or recurs.   Yes Historical Provider, MD  Eluxadoline 100 MG TABS Take 100 mg by mouth 2 (two) times daily.   Yes Historical Provider, MD  fluticasone (FLONASE) 50 MCG/ACT nasal spray Place 2 sprays into the nose as needed for rhinitis.   Yes Historical Provider, MD  gabapentin (NEURONTIN) 600 MG tablet Take 600 mg by mouth at bedtime.     Yes Historical Provider, MD  Insulin Lispro, Human, 200 UNIT/ML SOPN Inject into the skin.   Yes Historical Provider, MD  lamoTRIgine (LAMICTAL) 100 MG tablet Take 200 mg by mouth every morning.    Yes Historical Provider, MD  LANTUS SOLOSTAR 100 UNIT/ML Solostar Pen  05/20/14  Yes Historical Provider, MD  metFORMIN (GLUCOPHAGE-XR) 500 MG 24 hr tablet Take 1,000 mg by mouth 2 (two) times daily.   Yes Historical Provider, MD  naproxen (NAPROSYN) 500 MG tablet Take 500 mg by mouth 2 (two) times daily as needed (for pain).    Yes Historical Provider, MD  NEEDLE, DISP,  30 G 30G X 3/4" MISC Use as directed, daily blood sugars ( please dispense what patient had previously) 07/14/13  Yes Thao P Le, DO  omeprazole (PRILOSEC) 40 MG capsule Take 1 capsule (40 mg total) by mouth daily. 02/08/14  Yes Perfecto Purdy, PA-C  ONETOUCH VERIO test strip  01/25/14  Yes Historical Provider, MD  OXcarbazepine (TRILEPTAL) 150 MG tablet Take 150 mg by mouth at bedtime.    Yes Historical Provider, MD  pravastatin (PRAVACHOL) 40 MG tablet Take 40 mg by mouth daily.   Yes Historical Provider, MD  predniSONE (DELTASONE) 10 MG tablet Take 10 mg by mouth 2 (two) times daily with a meal. Now taking 1.5 tablets instead of 2    Yes Historical Provider, MD  pregabalin (LYRICA) 75 MG capsule Take 75 mg by mouth every morning.    Yes Historical Provider, MD  sertraline (ZOLOFT) 25 MG tablet Take 25 mg by mouth daily.   Yes Historical Provider, MD  topiramate (TOPAMAX) 25 MG tablet Take 25 mg by mouth 2 (two) times daily.   Yes Historical Provider, MD  ondansetron (ZOFRAN-ODT) 8 MG disintegrating tablet Take 0.5-1 tablets (4-8 mg total) by mouth every 8 (eight) hours as needed for nausea. Patient not taking: Reported on 08/23/2014 01/06/14   Harrison Mons, PA-C  promethazine (PHENERGAN) 25 MG suppository Place 1 suppository (25 mg total) rectally every 6 (six) hours as needed for nausea or vomiting. Patient not taking: Reported on 08/23/2014 01/06/14   Harrison Mons, PA-C     Allergies  Allergen Reactions  . Mestinon [Pyridostigmine Bromide] Nausea And Vomiting    "violently ill"  . Risperidone And Related Other (See Comments)    liver damage       Objective:  Physical Exam  Constitutional: She is oriented to person, place, and time. She appears well-developed and well-nourished. She is active and cooperative. No distress.  BP 94/68 mmHg  Pulse 99  Temp(Src) 98.3 F (36.8 C) (Oral)  Resp 18  Ht 5' (1.524 m)  Wt 196 lb 12.8 oz (89.268 kg)  BMI 38.43 kg/m2  SpO2 99%   Eyes: Conjunctivae are normal.  Pulmonary/Chest: Effort normal.  Neurological: She is alert and oriented to person, place, and time.  Skin: Skin is warm and dry.  Psychiatric: She has a normal mood and affect. Her speech is normal and behavior is normal.           Assessment & Plan:   1. Ocular myasthenia gravis Forms completed. Continue to adjust medications per neurology. Hopeful that she will be able to stop the prednisone at some point, especially given that she has diabetes, now followed by endocrinology.  2. Screening for HIV (human immunodeficiency virus) - HIV antibody   Fara Chute, PA-C Physician  Assistant-Certified [facility] .

## 2014-08-24 LAB — HIV ANTIBODY (ROUTINE TESTING W REFLEX): HIV: NONREACTIVE

## 2014-08-25 ENCOUNTER — Other Ambulatory Visit: Payer: Self-pay | Admitting: Physician Assistant

## 2014-08-25 DIAGNOSIS — R112 Nausea with vomiting, unspecified: Secondary | ICD-10-CM

## 2014-08-26 ENCOUNTER — Other Ambulatory Visit: Payer: Self-pay

## 2014-08-26 MED ORDER — ONDANSETRON 8 MG PO TBDP
4.0000 mg | ORAL_TABLET | Freq: Three times a day (TID) | ORAL | Status: DC | PRN
Start: 2014-08-26 — End: 2017-03-08

## 2014-08-26 NOTE — Addendum Note (Signed)
Addended by: Fara Chute on: 08/26/2014 04:48 PM   Modules accepted: Orders

## 2014-09-20 ENCOUNTER — Ambulatory Visit (INDEPENDENT_AMBULATORY_CARE_PROVIDER_SITE_OTHER): Payer: BC Managed Care – PPO | Admitting: Physician Assistant

## 2014-09-20 VITALS — BP 110/80 | HR 93 | Temp 98.4°F | Resp 16 | Ht 60.0 in | Wt 193.8 lb

## 2014-09-20 DIAGNOSIS — E119 Type 2 diabetes mellitus without complications: Secondary | ICD-10-CM

## 2014-09-20 DIAGNOSIS — G7000 Myasthenia gravis without (acute) exacerbation: Secondary | ICD-10-CM

## 2014-09-20 DIAGNOSIS — K589 Irritable bowel syndrome without diarrhea: Secondary | ICD-10-CM

## 2014-09-20 LAB — POCT GLYCOSYLATED HEMOGLOBIN (HGB A1C): Hemoglobin A1C: 6.4

## 2014-09-20 LAB — GLUCOSE, POCT (MANUAL RESULT ENTRY): POC Glucose: 69 mg/dl — AB (ref 70–99)

## 2014-09-20 NOTE — Progress Notes (Signed)
Patient ID: Michelle Torres, female    DOB: 06/20/1960, 54 y.o.   MRN: [id number]  PCP: [name], MD  Subjective:   Chief Complaint  Patient presents with  . Follow-up    paperwork and A1c, pt. wants to see Mayana Irigoyen    HPI Presents for paperwork completion for short term disability due to ocular myasthenia gravis, which causes double vision, and in turn nausea, vomiting and dizziness. Planning to apply for an extension of STD, and then LTD if she's not able to return to her job.  Is tapering off the prednisone slowly. Tracking episodes of diplopia.  Isn't sure when she will be able to get in with Dr. Elyse Hsu, due to the specialist co-pay ($70). Requests an A1C here today.  Diarrhea has recurred. Has an episode within 30 minutes of eating. Finds herself having diarrhea hourly for the next 5 hours. Occurs will all foods. Developed severe constipation with Viberzi, so had to stop it. Plans to follow-up with Dr. Benson Norway as soon as she can afford it.  Her mother's health continues to decline. No response to 3 chemotherapy treatments. Hopeful that the next three will provide some improvement. In addition, a friend of theirs has had a recurrence of previously treated cancer. Sadness and grief are weighing heavily on them now.  Review of Systems  Constitutional: Negative for fever and chills.  Eyes: Positive for visual disturbance (episodic diplopia).  Respiratory: Negative for cough, shortness of breath and wheezing.   Cardiovascular: Negative for chest pain and palpitations.  Gastrointestinal: Positive for diarrhea. Negative for nausea and vomiting.  Genitourinary: Negative for dysuria, urgency, frequency and hematuria.  Skin: Negative for rash.  Neurological: Negative for tremors, speech difficulty and weakness.       Patient Active Problem List   Diagnosis Date Noted  . GERD (gastroesophageal reflux disease) 02/15/2014  . IBS (irritable bowel syndrome) 02/15/2014  . Osteopenia  02/08/2014  . 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 Vitamin D deficiency 08/27/2013  . Hypothyroidism 08/27/2013  . Migraine 08/14/2013  . Bipolar 2 disorder 03/05/2011  . Diabetes mellitus 03/05/2011  . Fatty infiltration of liver 03/02/2011     Prior to Admission medications   Medication Sig Start Date End Date Taking? Authorizing Provider  azaTHIOprine (IMURAN) 50 MG tablet Take 50 mg by mouth 3 (three) times daily.    Yes Historical Provider, MD  Calcium Carbonate-Vitamin D (OSCAL 500/200 D-3 PO) Take 2 tablets by mouth daily.    Yes Historical Provider, MD  cetirizine (ZYRTEC) 10 MG tablet Take 10 mg by mouth daily.   Yes Historical Provider, MD  clonazePAM (KLONOPIN) 1 MG tablet Take 4 mg by mouth at bedtime. May take 1/2 to 1 tablet during the day as needed   Yes Historical Provider, MD  divalproex (DEPAKOTE ER) 500 MG 24 hr tablet Take 1,500 mg by mouth at bedtime.    Yes Historical Provider, MD  Dulaglutide (TRULICITY) 5.68 LE/7.5TZ SOPN Inject 1.5 mg into the skin once a week.    Yes Historical Provider, MD  eletriptan (RELPAX) 40 MG tablet Take 40 mg by mouth as needed for migraine. One tablet by mouth at onset of headache. May repeat in 2 hours if headache persists or recurs.   Yes Historical Provider, MD  Eluxadoline 100 MG TABS Take 100 mg by mouth 2 (two) times daily.   Yes Historical Provider, MD  fluticasone (FLONASE) 50 MCG/ACT nasal spray Place 2 sprays into the nose as needed for rhinitis.  Yes Historical Provider, MD  gabapentin (NEURONTIN) 600 MG tablet Take 600 mg by mouth at bedtime.     Yes Historical Provider, MD  Insulin Lispro, Human, 200 UNIT/ML SOPN Inject into the skin.   Yes Historical Provider, MD  lamoTRIgine (LAMICTAL) 100 MG tablet Take 200 mg by mouth every morning.    Yes Historical Provider, MD  LANTUS SOLOSTAR 100 UNIT/ML Solostar Pen  05/20/14  Yes Historical Provider, MD  metFORMIN (GLUCOPHAGE-XR) 500 MG 24 hr tablet Take 1,000 mg by mouth 2 (two)  times daily.   Yes Historical Provider, MD  naproxen (NAPROSYN) 500 MG tablet Take 500 mg by mouth 2 (two) times daily as needed (for pain).    Yes Historical Provider, MD  NEEDLE, DISP, 30 G 30G X 3/4" MISC Use as directed, daily blood sugars ( please dispense what patient had previously) 07/14/13  Yes Thao P Le, DO  omeprazole (PRILOSEC) 40 MG capsule Take 1 capsule (40 mg total) by mouth daily. 02/08/14  Yes Shriley Joffe, PA-C  ondansetron (ZOFRAN-ODT) 8 MG disintegrating tablet Take 0.5-1 tablets (4-8 mg total) by mouth every 8 (eight) hours as needed for nausea. 08/26/14  Yes Alissia Lory, PA-C  ONETOUCH VERIO test strip  01/25/14  Yes Historical Provider, MD  OXcarbazepine (TRILEPTAL) 150 MG tablet Take 150 mg by mouth at bedtime.    Yes Historical Provider, MD  pravastatin (PRAVACHOL) 40 MG tablet Take 40 mg by mouth daily.   Yes Historical Provider, MD  predniSONE (DELTASONE) 10 MG tablet Take 10 mg by mouth 1 day or 1 dose. Now taking 1.5 tablets instead of 2   Yes Historical Provider, MD  pregabalin (LYRICA) 75 MG capsule Take 75 mg by mouth every morning.    Yes Historical Provider, MD  promethazine (PHENERGAN) 25 MG suppository Place 1 suppository (25 mg total) rectally every 6 (six) hours as needed for nausea or vomiting. 01/06/14  Yes Jathan Balling, PA-C  sertraline (ZOLOFT) 25 MG tablet Take 25 mg by mouth daily.   Yes Historical Provider, MD  topiramate (TOPAMAX) 25 MG tablet Take 25 mg by mouth 2 (two) times daily.   Yes Historical Provider, MD     Allergies  Allergen Reactions  . Mestinon [Pyridostigmine Bromide] Nausea And Vomiting    "violently ill"  . Risperidone And Related Other (See Comments)    liver damage       Objective:  Physical Exam  Constitutional: She is oriented to person, place, and time. She appears well-developed and well-nourished. No distress.  BP 110/80 mmHg  Pulse 93  Temp(Src) 98.4 F (36.9 C) (Oral)  Resp 16  Ht 5' (1.524 m)  Wt 193 lb  12.8 oz (87.907 kg)  BMI 37.85 kg/m2  SpO2 97%   Eyes: Conjunctivae are normal. No scleral icterus.  Neck: No thyromegaly present.  Cardiovascular: Normal rate, regular rhythm, normal heart sounds and intact distal pulses.   Pulmonary/Chest: Effort normal and breath sounds normal.  Lymphadenopathy:    She has no cervical adenopathy.  Neurological: She is alert and oriented to person, place, and time.  Skin: Skin is warm and dry.  Psychiatric: She has a normal mood and affect. Her behavior is normal.   Results for orders placed or performed in visit on 09/20/14  POCT glucose (manual entry)  Result Value Ref Range   POC Glucose 69 (A) 70 - 99 mg/dl  POCT glycosylated hemoglobin (Hb A1C)  Result Value Ref Range   Hemoglobin A1C 6.4  Assessment & Plan:   1. Ocular myasthenia gravis Form completed. She'll follow-up with Neurology as planned.  2. Type 2 diabetes mellitus without complication Controlled. Continue current treatment. I will forward this to Dr. Elyse Hsu for his records. - POCT glucose (manual entry) - POCT glycosylated hemoglobin (Hb A1C)  3. IBS (irritable bowel syndrome) She will contact Dr. Ulyses Amor office. Perhaps they can prescribe something without an office visit, given the financial constraints she finds herself in presently.   Fara Chute, PA-C Physician Assistant-Certified [facility]

## 2014-10-04 NOTE — Progress Notes (Signed)
  Medical screening examination/treatment/procedure(s) were performed by non-physician practitioner and as supervising physician I was immediately available for consultation/collaboration.     

## 2014-10-28 ENCOUNTER — Encounter: Payer: Self-pay | Admitting: Physician Assistant

## 2014-10-28 ENCOUNTER — Ambulatory Visit (INDEPENDENT_AMBULATORY_CARE_PROVIDER_SITE_OTHER): Payer: BC Managed Care – PPO | Admitting: Physician Assistant

## 2014-10-28 VITALS — BP 110/74 | HR 99 | Temp 98.8°F | Resp 16 | Ht 60.0 in | Wt 193.4 lb

## 2014-10-28 DIAGNOSIS — G7000 Myasthenia gravis without (acute) exacerbation: Secondary | ICD-10-CM

## 2014-10-28 DIAGNOSIS — K589 Irritable bowel syndrome without diarrhea: Secondary | ICD-10-CM

## 2014-10-28 DIAGNOSIS — E119 Type 2 diabetes mellitus without complications: Secondary | ICD-10-CM | POA: Diagnosis not present

## 2014-10-28 DIAGNOSIS — Z23 Encounter for immunization: Secondary | ICD-10-CM | POA: Diagnosis not present

## 2014-10-28 NOTE — Progress Notes (Signed)
Subjective:    Patient ID: Michelle Torres, female    DOB: 1960/07/31, 54 y.o.   MRN: [id number] Michelle Reichmann, MD  Chief Complaint  Patient presents with  . Follow-up  . 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 She has been tapering off of Prednisone, while increasing dosage on Imuran. This caused increased episodes of diplopia, nausea, vomiting, and dizziness. She saw neurology last week who discussed that they believed they tapered her off of Prednisone too soon. They plan to keep her on Prednisone 5 mg for 3 months and re-evaluate tapering then. She is still on Imuran 3 mg daily.   DM Type II: Blood glucose was increasing and hard to control because of the Prednisone. She is on Metformin, Lantis, Dulaglutide and Humalog. She states that the Humalog is new, and has helped control her blood sugars around meals. Checks blood sugars at least twice a day, averaging 120-130 in the mornings.   IBS: Saw Dr. Benson Norway a few months ago for this, who put her on Viberzi 100 mg daily, which she states began to constipate her. Right now, she adjusts this medication based on how her body feels. If she feels she is getting constipated, she will go down to a half tablet. She is also careful with the foods she eats, like fruits and vegetables, that she knows will cause her diarrhea.    Review of Systems  Constitutional: Negative for fever and chills.  Eyes: Positive for visual disturbance (diplopia in left eye).  Respiratory: Negative for shortness of breath.   Cardiovascular: Negative for chest pain.  Gastrointestinal: Positive for nausea, vomiting, diarrhea and constipation.  Skin: Negative for rash.  Neurological: Positive for dizziness.    Current outpatient prescriptions:  .  azaTHIOprine (IMURAN) 50 MG tablet, Take 50 mg by mouth 3 (three) times daily. , Disp: , Rfl:  .  Calcium Carbonate-Vitamin D (OSCAL  500/200 D-3 PO), Take 2 tablets by mouth daily. , Disp: , Rfl:  .  cetirizine (ZYRTEC) 10 MG tablet, Take 10 mg by mouth daily., Disp: , Rfl:  .  clonazePAM (KLONOPIN) 1 MG tablet, Take 4 mg by mouth at bedtime. May take 1/2 to 1 tablet during the day as needed, Disp: , Rfl:  .  divalproex (DEPAKOTE ER) 500 MG 24 hr tablet, Take 1,500 mg by mouth at bedtime. , Disp: , Rfl:  .  Dulaglutide (TRULICITY) 0.35 WS/5.6CL SOPN, Inject 1.5 mg into the skin once a week. , Disp: , Rfl:  .  eletriptan (RELPAX) 40 MG tablet, Take 40 mg by mouth as needed for migraine. One tablet by mouth at onset of headache. May repeat in 2 hours if headache persists or recurs., Disp: , Rfl:  .  Eluxadoline 100 MG TABS, Take 100 mg by mouth 2 (two) times daily., Disp: , Rfl:  .  fluticasone (FLONASE) 50 MCG/ACT nasal spray, Place 2 sprays into the nose as needed for rhinitis., Disp: , Rfl:  .  gabapentin (NEURONTIN) 600 MG tablet, Take 600 mg by mouth at bedtime.  , Disp: , Rfl:  .  Insulin Lispro, Human, 200 UNIT/ML SOPN, Inject into the skin., Disp: , Rfl:  .  lamoTRIgine (LAMICTAL) 100 MG tablet, Take 200 mg by mouth every morning. , Disp: , Rfl:  .  LANTUS SOLOSTAR 100 UNIT/ML Solostar Pen, , Disp: , Rfl: 11 .  metFORMIN (GLUCOPHAGE-XR) 500 MG 24  hr tablet, Take 1,000 mg by mouth 2 (two) times daily., Disp: , Rfl:  .  naproxen (NAPROSYN) 500 MG tablet, Take 500 mg by mouth 2 (two) times daily as needed (for pain). , Disp: , Rfl:  .  NEEDLE, DISP, 30 G 30G X 3/4" MISC, Use as directed, daily blood sugars ( please dispense what patient had previously), Disp: 100 each, Rfl: 3 .  omeprazole (PRILOSEC) 40 MG capsule, Take 1 capsule (40 mg total) by mouth daily., Disp: 30 capsule, Rfl: 3 .  ondansetron (ZOFRAN-ODT) 8 MG disintegrating tablet, Take 0.5-1 tablets (4-8 mg total) by mouth every 8 (eight) hours as needed for nausea., Disp: 90 tablet, Rfl: 0 .  ONETOUCH VERIO test strip, , Disp: , Rfl: 12 .  OXcarbazepine (TRILEPTAL)  150 MG tablet, Take 150 mg by mouth at bedtime. , Disp: , Rfl:  .  pravastatin (PRAVACHOL) 40 MG tablet, Take 40 mg by mouth daily., Disp: , Rfl:  .  predniSONE (DELTASONE) 10 MG tablet, Take 10 mg by mouth 1 day or 1 dose. Now taking 1.5 tablets instead of 2, Disp: , Rfl:  .  pregabalin (LYRICA) 75 MG capsule, Take 75 mg by mouth every morning. , Disp: , Rfl:  .  promethazine (PHENERGAN) 25 MG suppository, Place 1 suppository (25 mg total) rectally every 6 (six) hours as needed for nausea or vomiting., Disp: 12 each, Rfl: 0 .  sertraline (ZOLOFT) 25 MG tablet, Take 25 mg by mouth daily., Disp: , Rfl:  .  topiramate (TOPAMAX) 25 MG tablet, Take 25 mg by mouth 2 (two) times daily., Disp: , Rfl:    Allergies  Allergen Reactions  . Mestinon [Pyridostigmine Bromide] Nausea And Vomiting    "violently ill"  . Risperidone And Related Other (See Comments)    liver damage      Objective:   Physical Exam  Constitutional: She is oriented to person, place, and time. She appears well-developed and well-nourished.  HENT:  Head: Normocephalic and atraumatic.  Eyes: Conjunctivae are normal. Pupils are equal, round, and reactive to light.  Neck: Normal range of motion. Neck supple.  Cardiovascular: Normal rate, regular rhythm and normal heart sounds.   Pulmonary/Chest: Effort normal and breath sounds normal.  Abdominal: Soft. Bowel sounds are normal. She exhibits no distension and no mass. There is no tenderness. There is no rebound and no guarding.  Neurological: She is alert and oriented to person, place, and time.  Skin: Skin is warm and dry.  Psychiatric: She has a normal mood and affect. Her behavior is normal.   BP 110/74 mmHg  Pulse 99  Temp(Src) 98.8 F (37.1 C) (Oral)  Resp 16  Ht 5' (1.524 m)  Wt 193 lb 6.4 oz (87.726 kg)  BMI 37.77 kg/m2  SpO2 97%        Assessment & Plan:  1. Ocular myasthenia gravis Form completed. Managed by neurology.   2. Need for influenza  vaccination - Flu Vaccine QUAD 36+ mos IM  3. Type 2 diabetes mellitus without complication Hemoglobin W2O done at last visit in August. Followed by endocrinology (Dr. Elyse Hsu)   4. Irritable bowel syndrome Followed by Dr. Benson Norway.  Eunique Balik D. Race, PA-S Physician Assistant Student [facility]

## 2014-10-28 NOTE — Patient Instructions (Signed)
Keep up the great work!

## 2014-10-28 NOTE — Progress Notes (Signed)
Chief Complaint  Patient presents with  . Follow-up  . 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 She has been tapering off of Prednisone, while increasing dosage on Imuran. This caused increased episodes of diplopia, nausea, vomiting, and dizziness. She saw neurology last week who discussed that they believed they tapered her off of Prednisone too soon. They plan to keep her on Prednisone 5 mg for 3 months and re-evaluate tapering then. She is still on Imuran 3 mg daily.   DM Type II: Blood glucose was increasing and hard to control because of the Prednisone. She is on Metformin, Lantis, Dulaglutide and Humalog. She states that the Humalog is new, and has helped control her blood sugars around meals. Checks blood sugars at least twice a day, averaging 120-130 in the mornings.   IBS: Saw Dr. Benson Norway a few months ago for this, who put her on Viberzi 100 mg daily, which she states began to constipate her. Right now, she adjusts this medication based on how her body feels. If she feels she is getting constipated, she will go down to a half tablet. She is also careful with the foods she eats, like fruits and vegetables, that she knows will cause her diarrhea.   REVIEW OF SYSTEMS: Constitutional: Negative for fever and chills.  Eyes: Positive for visual disturbance (diplopia in left eye).  Respiratory: Negative for shortness of breath.  Cardiovascular: Negative for chest pain.  Gastrointestinal: Positive for nausea, vomiting, diarrhea and constipation.  Skin: Negative for rash.  Neurological: Positive for dizziness.     Allergies  Allergen Reactions  . Mestinon [Pyridostigmine Bromide] Nausea And Vomiting    "violently ill"  . Risperidone And Related Other (See Comments)    liver damage    Prior to Admission medications   Medication Sig Start Date End Date Taking? Authorizing  Provider  azaTHIOprine (IMURAN) 50 MG tablet Take 50 mg by mouth 3 (three) times daily.    Yes Historical Provider, MD  Calcium Carbonate-Vitamin D (OSCAL 500/200 D-3 PO) Take 2 tablets by mouth daily.    Yes Historical Provider, MD  cetirizine (ZYRTEC) 10 MG tablet Take 10 mg by mouth daily.   Yes Historical Provider, MD  clonazePAM (KLONOPIN) 1 MG tablet Take 4 mg by mouth at bedtime. May take 1/2 to 1 tablet during the day as needed   Yes Historical Provider, MD  divalproex (DEPAKOTE ER) 500 MG 24 hr tablet Take 1,500 mg by mouth at bedtime.    Yes Historical Provider, MD  eletriptan (RELPAX) 40 MG tablet Take 40 mg by mouth as needed for migraine. One tablet by mouth at onset of headache. May repeat in 2 hours if headache persists or recurs.   Yes Historical Provider, MD  fluticasone (FLONASE) 50 MCG/ACT nasal spray Place 2 sprays into the nose as needed for rhinitis.   Yes Historical Provider, MD  gabapentin (NEURONTIN) 600 MG tablet Take 600 mg by mouth at bedtime.     Yes Historical Provider, MD  Insulin Lispro, Human, 200 UNIT/ML SOPN Inject into the skin.   Yes Historical Provider, MD  lamoTRIgine (LAMICTAL) 100 MG tablet Take 200 mg by mouth every morning.    Yes Historical Provider, MD  LANTUS SOLOSTAR 100 UNIT/ML Solostar Pen  05/20/14  Yes Historical Provider, MD  metFORMIN (GLUCOPHAGE-XR) 500 MG 24 hr tablet Take 1,000 mg by mouth 2 (two) times daily.  Yes Historical Provider, MD  naproxen (NAPROSYN) 500 MG tablet Take 500 mg by mouth 2 (two) times daily as needed (for pain).    Yes Historical Provider, MD  NEEDLE, DISP, 30 G 30G X 3/4" MISC Use as directed, daily blood sugars ( please dispense what patient had previously) 07/14/13  Yes Thao P Le, DO  omeprazole (PRILOSEC) 40 MG capsule Take 1 capsule (40 mg total) by mouth daily. 02/08/14  Yes Lexie Koehl, PA-C  ondansetron (ZOFRAN-ODT) 8 MG disintegrating tablet Take 0.5-1 tablets (4-8 mg total) by mouth every 8 (eight) hours as  needed for nausea. 08/26/14  Yes Deshia Vanderhoof, PA-C  ONETOUCH VERIO test strip  01/25/14  Yes Historical Provider, MD  OXcarbazepine (TRILEPTAL) 150 MG tablet Take 150 mg by mouth at bedtime.    Yes Historical Provider, MD  pravastatin (PRAVACHOL) 40 MG tablet Take 40 mg by mouth daily.   Yes Historical Provider, MD  predniSONE (DELTASONE) 10 MG tablet Take 10 mg by mouth 1 day or 1 dose. Now taking 1.5 tablets instead of 2   Yes Historical Provider, MD  pregabalin (LYRICA) 75 MG capsule Take 75 mg by mouth every morning.    Yes Historical Provider, MD  promethazine (PHENERGAN) 25 MG suppository Place 1 suppository (25 mg total) rectally every 6 (six) hours as needed for nausea or vomiting. 01/06/14  Yes Ainara Eldridge, PA-C  sertraline (ZOLOFT) 25 MG tablet Take 25 mg by mouth daily.   Yes Historical Provider, MD  topiramate (TOPAMAX) 25 MG tablet Take 25 mg by mouth 2 (two) times daily.   Yes Historical Provider, MD  TRULICITY 1.5 QZ/0.0PQ SOPN INJECT 1.5 MG WEEKLY FOR DIABETES 09/22/14   Historical Provider, MD  VIBERZI 75 MG TABS Take 1 tablet by mouth 2 (two) times daily. 10/18/14   Historical Provider, MD    Patient Active Problem List   Diagnosis Date Noted  . GERD (gastroesophageal reflux disease) 02/15/2014  . IBS (irritable bowel syndrome) 02/15/2014  . Osteopenia 02/08/2014  . 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 Vitamin D deficiency 08/27/2013  . Hypothyroidism 08/27/2013  . Migraine 08/14/2013  . Bipolar 2 disorder 03/05/2011  . Diabetes mellitus 03/05/2011  . Fatty infiltration of liver 03/02/2011     Physical Exam  Constitutional: She is oriented to person, place, and time. She appears well-developed and well-nourished. She is active and cooperative. No distress.  BP 110/74 mmHg  Pulse 99  Temp(Src) 98.8 F (37.1 C) (Oral)  Resp 16  Ht 5' (1.524 m)  Wt 193 lb 6.4 oz (87.726 kg)  BMI 37.77 kg/m2  SpO2 97%   Eyes: Conjunctivae are normal.  Pulmonary/Chest: Effort  normal.  Neurological: She is alert and oriented to person, place, and time.  Psychiatric: She has a normal mood and affect. Her speech is normal and behavior is normal.      ASSESSMENT & PLAN:  1. Ocular myasthenia gravis Continue treatment and follow-up with specialists. Forms completed.  2. Need for influenza vaccination - Flu Vaccine QUAD 36+ mos IM  3. Type 2 diabetes mellitus without complication Continue follow-up with endocrinology.  4. Irritable bowel syndrome Continue follow-up with GI.   Fara Chute, PA-C Physician Assistant-Certified [facility]

## 2014-11-23 ENCOUNTER — Encounter: Payer: Self-pay | Admitting: Physician Assistant

## 2014-11-23 ENCOUNTER — Ambulatory Visit (INDEPENDENT_AMBULATORY_CARE_PROVIDER_SITE_OTHER): Payer: BC Managed Care – PPO | Admitting: Physician Assistant

## 2014-11-23 ENCOUNTER — Encounter: Payer: Self-pay | Admitting: Emergency Medicine

## 2014-11-23 VITALS — BP 104/78 | HR 96 | Temp 98.8°F | Resp 16 | Ht 60.0 in | Wt 191.2 lb

## 2014-11-23 DIAGNOSIS — E119 Type 2 diabetes mellitus without complications: Secondary | ICD-10-CM | POA: Diagnosis not present

## 2014-11-23 DIAGNOSIS — Z794 Long term (current) use of insulin: Secondary | ICD-10-CM

## 2014-11-23 DIAGNOSIS — K580 Irritable bowel syndrome with diarrhea: Secondary | ICD-10-CM

## 2014-11-23 DIAGNOSIS — G7000 Myasthenia gravis without (acute) exacerbation: Secondary | ICD-10-CM

## 2014-11-23 DIAGNOSIS — R05 Cough: Secondary | ICD-10-CM

## 2014-11-23 DIAGNOSIS — R059 Cough, unspecified: Secondary | ICD-10-CM

## 2014-11-23 MED ORDER — HYDROCODONE-HOMATROPINE 5-1.5 MG/5ML PO SYRP: 5.0000 mL | ORAL_SOLUTION | Freq: Two times a day (BID) | ORAL | Status: DC | PRN

## 2014-11-23 MED ORDER — BENZONATATE 100 MG PO CAPS: 100.0000 mg | ORAL_CAPSULE | Freq: Three times a day (TID) | ORAL | Status: DC | PRN

## 2014-11-23 NOTE — Progress Notes (Signed)
Patient ID: Michelle Torres, female    DOB: [birth year], 54 y.o.   MRN: [id number]  PCP: Arnita Koons, PA-C  Subjective:   Chief Complaint  Patient presents with  . Follow-up  . Diabetes  . ocular myasthenia gravis  . 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   She is followed by [facility] for this. In August of 2016, Neurology was attempting to taper her off of Prednisone while increasing dosage of Imuran. They tapered her too quickly however, and she was having increased episodes of diplopia, nausea, vomiting, and dizziness. They decided to keep her on Prednisone 5 mg and this will be re-evaluated in December of 2016. She remains on 3 mg Imuran, which is the highest dosage she can take. She still continues to have episodes of diplopia which cause nausea, vomiting, and dizziness, although episodes aren't lasting as long as they used to.   DM Type II: She sees endocrinology. She just had labs done 3 and a half weeks ago. She is still on Metformin, Lantis, Dulaglutide, and Humalog. She checks her blood sugars twice a day. Still averaging around 120-130 in the mornings. She states that her last A1C was 6.5.   IBS: She is still taking Viberzi 100 mg daily. This is helping a lot. She is followed by GI for this problem.   Other complaints:  On Thursday night, she noticed her throat was starting to feel "yucky and scratchy". She started to sneeze and cough. She did OTC Dayquil and Delsym which has helped a little bit. The Delsym helps stop her cough enough so she can fall asleep, but she is waking her husband and cat up with her cough. She is having sinus pressure and green rhinorrhea. No sick contacts. No fever or chills.   Review of Systems Constitutional: Negative for fever and chills.  HENT: Positive for congestion, rhinorrhea, sinus pressure and sneezing.  Eyes:  Positive for visual disturbance (Left eye d/t myasthenia gravis).  Respiratory: Negative for shortness of breath.  Cardiovascular: Negative for chest pain.  Gastrointestinal: Positive for nausea, vomiting and diarrhea.  Diarrhea is due to IBS  Nausea, vomiting when she gets blurry vision from left ocular myasthenia gravis  Neurological: Positive for dizziness (When vision is disturbed).  See HPI        Patient Active Problem List   Diagnosis Date Noted  . GERD (gastroesophageal reflux disease) 02/15/2014  . IBS (irritable bowel syndrome) 02/15/2014  . Osteopenia 02/08/2014  . Ocular myasthenia gravis ([place]) 10/07/2013  . Vitamin D deficiency 08/27/2013  . Hypothyroidism 08/27/2013  . Migraine 08/14/2013  . Bipolar 2 disorder ([place]) 03/05/2011  . Diabetes mellitus ([place]) 03/05/2011  . Fatty infiltration of liver 03/02/2011     Prior to Admission medications   Medication Sig Start Date End Date Taking? Authorizing Provider  azaTHIOprine (IMURAN) 50 MG tablet Take 50 mg by mouth 3 (three) times daily.    Yes Historical Provider, MD  Calcium Carbonate-Vitamin D (OSCAL 500/200 D-3 PO) Take 2 tablets by mouth daily.    Yes Historical Provider, MD  cetirizine (ZYRTEC) 10 MG tablet Take 10 mg by mouth daily.   Yes Historical Provider, MD  clonazePAM (KLONOPIN) 1 MG tablet Take 4 mg by mouth at bedtime. May take 1/2 to 1 tablet during the day as needed   Yes Historical Provider, MD  divalproex (DEPAKOTE ER) 500 MG 24 hr tablet Take 1,500 mg by mouth at bedtime.    Yes Historical Provider, MD  eletriptan (RELPAX) 40 MG tablet Take 40 mg by mouth as needed for migraine. One tablet by mouth at onset of headache. May repeat in 2 hours if headache persists or recurs.   Yes Historical Provider, MD  fluticasone (FLONASE) 50 MCG/ACT nasal spray Place 2 sprays into the nose as needed for rhinitis.   Yes Historical Provider, MD  gabapentin (NEURONTIN) 600 MG tablet Take 600 mg by mouth at bedtime.      Yes Historical Provider, MD  Insulin Lispro, Human, 200 UNIT/ML SOPN Inject into the skin.   Yes Historical Provider, MD  lamoTRIgine (LAMICTAL) 100 MG tablet Take 200 mg by mouth every morning.    Yes Historical Provider, MD  LANTUS SOLOSTAR 100 UNIT/ML Solostar Pen  05/20/14  Yes Historical Provider, MD  metFORMIN (GLUCOPHAGE-XR) 500 MG 24 hr tablet Take 1,000 mg by mouth 2 (two) times daily.   Yes Historical Provider, MD  naproxen (NAPROSYN) 500 MG tablet Take 500 mg by mouth 2 (two) times daily as needed (for pain).    Yes Historical Provider, MD  NEEDLE, DISP, 30 G 30G X 3/4" MISC Use as directed, daily blood sugars ( please dispense what patient had previously) 07/14/13  Yes Thao P Le, DO  omeprazole (PRILOSEC) 40 MG capsule Take 1 capsule (40 mg total) by mouth daily. 02/08/14  Yes Linzey Ramser, PA-C  ondansetron (ZOFRAN-ODT) 8 MG disintegrating tablet Take 0.5-1 tablets (4-8 mg total) by mouth every 8 (eight) hours as needed for nausea. 08/26/14  Yes Klarisa Barman, PA-C  ONETOUCH VERIO test strip  01/25/14  Yes Historical Provider, MD  OXcarbazepine (TRILEPTAL) 150 MG tablet Take 150 mg by mouth at bedtime.    Yes Historical Provider, MD  pravastatin (PRAVACHOL) 40 MG tablet Take 40 mg by mouth daily.   Yes Historical Provider, MD  predniSONE (DELTASONE) 10 MG tablet Take 10 mg by mouth 1 day or 1 dose. Now taking 1.5 tablets instead of 2   Yes Historical Provider, MD  pregabalin (LYRICA) 75 MG capsule Take 75 mg by mouth every morning.    Yes Historical Provider, MD  promethazine (PHENERGAN) 25 MG suppository Place 1 suppository (25 mg total) rectally every 6 (six) hours as needed for nausea or vomiting. 01/06/14  Yes Aviah Sorci, PA-C  sertraline (ZOLOFT) 25 MG tablet Take 25 mg by mouth daily.   Yes Historical Provider, MD  topiramate (TOPAMAX) 25 MG tablet Take 25 mg by mouth 2 (two) times daily.   Yes Historical Provider, MD  TRULICITY 1.5 JH/4.1DE SOPN INJECT 1.5 MG WEEKLY FOR  DIABETES 09/22/14  Yes Historical Provider, MD  VIBERZI 75 MG TABS Take 1 tablet by mouth 2 (two) times daily. 10/18/14  Yes Historical Provider, MD     Allergies  Allergen Reactions  . Mestinon [Pyridostigmine Bromide] Nausea And Vomiting    "violently ill"  . Risperidone And Related Other (See Comments)    liver damage       Objective:  Physical Exam  Constitutional: She is oriented to person, place, and time. She appears well-developed and well-nourished. No distress.  BP 104/78 mmHg  Pulse 96  Temp(Src) 98.8 F (37.1 C) (Oral)  Resp 16  Ht 5' (1.524 m)  Wt 191 lb 3.2 oz (86.728 kg)  BMI 37.34 kg/m2  SpO2 96%   Eyes: Conjunctivae are normal. No scleral icterus.  Neck: No  thyromegaly present.  Cardiovascular: Normal rate, regular rhythm, normal heart sounds and intact distal pulses.   Pulmonary/Chest: Effort normal and breath sounds normal.  Lymphadenopathy:    She has no cervical adenopathy.  Neurological: She is alert and oriented to person, place, and time.  Skin: Skin is warm and dry.  Psychiatric: She has a normal mood and affect. Her behavior is normal.           Assessment & Plan:   1. Ocular myasthenia gravis [facility]) Continue follow-up with ophthalmology and neurology as planned.  2. Type 2 diabetes mellitus without complication, with long-term current use of insulin ([place]) Continue follow-up with endocrinology as planned.  3. Cough Likely viral. Anticipatory guidance. Monitor for worsening or persistent symptoms. Supportive care. - benzonatate (TESSALON) 100 MG capsule; Take 1-2 capsules (100-200 mg total) by mouth 3 (three) times daily as needed for cough.  Dispense: 40 capsule; Refill: 0 - HYDROcodone-homatropine (HYCODAN) 5-1.5 MG/5ML syrup; Take 5 mLs by mouth every 12 (twelve) hours as needed for cough.  Dispense: 100 mL; Refill: 0  4. Irritable bowel syndrome with diarrhea Stable presently on current treatment.   Fara Chute,  PA-C Physician Assistant-Certified [facility]

## 2014-11-23 NOTE — Progress Notes (Signed)
Subjective:     Patient ID: Michelle Torres, female   DOB: 1960/10/08, 54 y.o.   MRN: [id number] PCP: JEFFERY,CHELLE, PA-C  Chief Complaint  Patient presents with  . Follow-up  . Diabetes  . ocular myasthenia gravis  . 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 She is followed by [facility] for this. In August of 2016, Neurology was attempting to taper her off of Prednisone while increasing dosage of Imuran. They tapered her too quickly however, and she was having increased episodes of diplopia, nausea, vomiting, and dizziness. They decided to keep her on Prednisone 5 mg and this will be re-evaluated in December of 2016. She remains on 3 mg Imuran, which is the highest dosage she can take. She still continues to have episodes of diplopia which cause nausea, vomiting, and dizziness, although episodes aren't lasting as long as they used to.   DM Type II: She sees endocrinology. She just had labs done 3 and a half weeks ago. She is still on Metformin, Lantis, Dulaglutide, and Humalog. She checks her blood sugars twice a day. Still averaging around 120-130 in the mornings. She states that her last A1C was 6.5.   IBS: She is still taking Viberzi 100 mg daily. This is helping a lot. She is followed by GI for this problem.  Other complaints: On Thursday night, she noticed her throat was starting to feel "yucky and scratchy". She started to sneeze and cough. She did OTC Dayquil and Delsym which has helped a little bit. The Delsym helps stop her cough enough so she can fall asleep, but she is waking her husband and cat up with her cough. She is having sinus pressure and green rhinorrhea. No sick contacts. No fever or chills.   Review of Systems  Constitutional: Negative for fever and chills.  HENT: Positive for congestion, rhinorrhea, sinus pressure and sneezing.   Eyes: Positive for  visual disturbance (Left eye d/t myasthenia gravis).  Respiratory: Negative for shortness of breath.   Cardiovascular: Negative for chest pain.  Gastrointestinal: Positive for nausea, vomiting and diarrhea.       Diarrhea is due to IBS  Nausea, vomiting when she gets blurry vision from left ocular myasthenia gravis   Neurological: Positive for dizziness (When vision is disturbed).  See HPI   Patient Active Problem List   Diagnosis Date Noted  . GERD (gastroesophageal reflux disease) 02/15/2014  . IBS (irritable bowel syndrome) 02/15/2014  . Osteopenia 02/08/2014  . Ocular myasthenia gravis ([place]) 10/07/2013  . Vitamin D deficiency 08/27/2013  . Hypothyroidism 08/27/2013  . Migraine 08/14/2013  . Bipolar 2 disorder ([place]) 03/05/2011  . Diabetes mellitus ([place]) 03/05/2011  . Fatty infiltration of liver 03/02/2011    Prior to Admission medications   Medication Sig Start Date End Date Taking? Authorizing Provider  azaTHIOprine (IMURAN) 50 MG tablet Take 50 mg by mouth 3 (three) times daily.    Yes Historical Provider, MD  Calcium Carbonate-Vitamin D (OSCAL 500/200 D-3 PO) Take 2 tablets by mouth daily.    Yes Historical Provider, MD  cetirizine (ZYRTEC) 10 MG tablet Take 10 mg by mouth daily.   Yes Historical Provider, MD  clonazePAM (KLONOPIN) 1 MG tablet Take 4 mg by mouth at bedtime. May take 1/2 to 1 tablet during the day as needed   Yes Historical Provider, MD  divalproex (DEPAKOTE  ER) 500 MG 24 hr tablet Take 1,500 mg by mouth at bedtime.    Yes Historical Provider, MD  eletriptan (RELPAX) 40 MG tablet Take 40 mg by mouth as needed for migraine. One tablet by mouth at onset of headache. May repeat in 2 hours if headache persists or recurs.   Yes Historical Provider, MD  fluticasone (FLONASE) 50 MCG/ACT nasal spray Place 2 sprays into the nose as needed for rhinitis.   Yes Historical Provider, MD  gabapentin (NEURONTIN) 600 MG tablet Take 600 mg by mouth at bedtime.     Yes Historical  Provider, MD  Insulin Lispro, Human, 200 UNIT/ML SOPN Inject into the skin.   Yes Historical Provider, MD  lamoTRIgine (LAMICTAL) 100 MG tablet Take 200 mg by mouth every morning.    Yes Historical Provider, MD  LANTUS SOLOSTAR 100 UNIT/ML Solostar Pen  05/20/14  Yes Historical Provider, MD  metFORMIN (GLUCOPHAGE-XR) 500 MG 24 hr tablet Take 1,000 mg by mouth 2 (two) times daily.   Yes Historical Provider, MD  naproxen (NAPROSYN) 500 MG tablet Take 500 mg by mouth 2 (two) times daily as needed (for pain).    Yes Historical Provider, MD  NEEDLE, DISP, 30 G 30G X 3/4" MISC Use as directed, daily blood sugars ( please dispense what patient had previously) 07/14/13  Yes Thao P Le, DO  omeprazole (PRILOSEC) 40 MG capsule Take 1 capsule (40 mg total) by mouth daily. 02/08/14  Yes Chelle Jeffery, PA-C  ondansetron (ZOFRAN-ODT) 8 MG disintegrating tablet Take 0.5-1 tablets (4-8 mg total) by mouth every 8 (eight) hours as needed for nausea. 08/26/14  Yes Chelle Jeffery, PA-C  ONETOUCH VERIO test strip  01/25/14  Yes Historical Provider, MD  OXcarbazepine (TRILEPTAL) 150 MG tablet Take 150 mg by mouth at bedtime.    Yes Historical Provider, MD  pravastatin (PRAVACHOL) 40 MG tablet Take 40 mg by mouth daily.   Yes Historical Provider, MD  predniSONE (DELTASONE) 10 MG tablet Take 10 mg by mouth 1 day or 1 dose. Now taking 1.5 tablets instead of 2   Yes Historical Provider, MD  pregabalin (LYRICA) 75 MG capsule Take 75 mg by mouth every morning.    Yes Historical Provider, MD  promethazine (PHENERGAN) 25 MG suppository Place 1 suppository (25 mg total) rectally every 6 (six) hours as needed for nausea or vomiting. 01/06/14  Yes Chelle Jeffery, PA-C  sertraline (ZOLOFT) 25 MG tablet Take 25 mg by mouth daily.   Yes Historical Provider, MD  topiramate (TOPAMAX) 25 MG tablet Take 25 mg by mouth 2 (two) times daily.   Yes Historical Provider, MD  TRULICITY 1.5 TG/6.2IR SOPN INJECT 1.5 MG WEEKLY FOR DIABETES 09/22/14  Yes  Historical Provider, MD  VIBERZI 75 MG TABS Take 1 tablet by mouth 2 (two) times daily. 10/18/14  Yes Historical Provider, MD  benzonatate (TESSALON) 100 MG capsule Take 1-2 capsules (100-200 mg total) by mouth 3 (three) times daily as needed for cough. 11/23/14   Chelle Jeffery, PA-C  HYDROcodone-homatropine (HYCODAN) 5-1.5 MG/5ML syrup Take 5 mLs by mouth every 12 (twelve) hours as needed for cough. 11/23/14   Harrison Mons, PA-C     Allergies  Allergen Reactions  . Mestinon [Pyridostigmine Bromide] Nausea And Vomiting    "violently ill"  . Risperidone And Related Other (See Comments)    liver damage    Objective:  Physical Exam  Constitutional: She is oriented to person, place, and time. She appears well-developed and well-nourished.  HENT:  Head: Normocephalic and atraumatic.  Right Ear: Hearing, tympanic membrane, external ear and ear canal normal.  Left Ear: Hearing, tympanic membrane, external ear and ear canal normal.  Nose: Right sinus exhibits maxillary sinus tenderness and frontal sinus tenderness. Left sinus exhibits maxillary sinus tenderness and frontal sinus tenderness.  Mouth/Throat: Uvula is midline, oropharynx is clear and moist and mucous membranes are normal. No oropharyngeal exudate.  Eyes: Conjunctivae are normal. Pupils are equal, round, and reactive to light.  Neck: Normal range of motion. Neck supple.  Cardiovascular: Normal rate and regular rhythm.   Pulmonary/Chest: Effort normal and breath sounds normal.  Lymphadenopathy:       Head (right side): No submental, no submandibular, no tonsillar, no preauricular and no posterior auricular adenopathy present.       Head (left side): No submental, no submandibular, no tonsillar, no preauricular and no posterior auricular adenopathy present.    She has no cervical adenopathy.  Neurological: She is alert and oriented to person, place, and time.  Skin: Skin is warm and dry.  Psychiatric: She has a normal mood and  affect. Her behavior is normal. Thought content normal.     BP 104/78 mmHg  Pulse 96  Temp(Src) 98.8 F (37.1 C) (Oral)  Resp 16  Ht 5' (1.524 m)  Wt 191 lb 3.2 oz (86.728 kg)  BMI 37.34 kg/m2  SpO2 96%  Assessment & Plan:  1. Ocular myasthenia gravis ([place]) Forms completed. Follow-up with Neurology as scheduled.   2. Type 2 diabetes mellitus without complication, with long-term current use of insulin ([place]) Continue current treatment. Follow-up with endocrinology as scheduled.  3. Cough Tessalon for daytime cough. Hycodan for nighttime cough as needed. OTC Mucinex to decrease mucus production. Use home nasal spray as needed.  - benzonatate (TESSALON) 100 MG capsule; Take 1-2 capsules (100-200 mg total) by mouth 3 (three) times daily as needed for cough.  Dispense: 40 capsule; Refill: 0 - HYDROcodone-homatropine (HYCODAN) 5-1.5 MG/5ML syrup; Take 5 mLs by mouth every 12 (twelve) hours as needed for cough.  Dispense: 100 mL; Refill: 0  4. Irritable bowel syndrome with diarrhea Continue current treatment and follow-up with Gastroenterology as scheduled.    Follow-up in 1 month (around 12/24/2014).   Audley Hinojos D. Race, PA-S Physician Assistant Student [facility]

## 2014-12-21 ENCOUNTER — Ambulatory Visit (INDEPENDENT_AMBULATORY_CARE_PROVIDER_SITE_OTHER): Payer: BC Managed Care – PPO | Admitting: Physician Assistant

## 2014-12-21 ENCOUNTER — Encounter: Payer: Self-pay | Admitting: Physician Assistant

## 2014-12-21 VITALS — BP 102/72 | HR 108 | Temp 98.0°F | Resp 16 | Ht 60.0 in | Wt 200.0 lb

## 2014-12-21 DIAGNOSIS — G7000 Myasthenia gravis without (acute) exacerbation: Secondary | ICD-10-CM

## 2014-12-21 NOTE — Progress Notes (Signed)
Subjective:    Patient ID: Michelle Torres, female    DOB: 28-Mar-1960, 54 y.o.   MRN: [id number]  Chief Complaint  Patient presents with  . Follow-up  . paperwork   HPI Patient presents today or follow-up of ocular myasthenia gravis. Patient was diagnosed in Sept. 2015. Doing well overall. Saw her optometrist today for her annual eye exam today, started on an occluded contact lens for the left eye. Previously wore eye patches and darkened sunglasses in the left eye for double vision.  Having double vision episodes approx. 6x per month. Was previously having episodes every day.   Type DM II: Still checking blood sugars twice daily. Usually her sugars run in the 70-90 range. Was 133 this AM, but believes this is d/t stress from her current work situation and her mother's recent illness. Currently on sliding scale insulin and is followed by endocrinology.   IBS: Still taking Viberzi 75 mg once daily. Continues to help. Symptoms have been worse over the last 2 days, suspects this is also d/t her recent stress.   Has been sleeping poorly for the past 2 days. Wakes up in the middle of the night. Has been taking 4 Klonopin the last 2 days with good anxiety relief.   Patient reports she is still sneezing, dry coughing, and having sinus pressure. Her symptoms initially improved for a couple of weeks since her last visit, but this week they have been worse. Uses OTC cetirizine and occasional OTC nasal spray with moderate relief.   No other concerns on today's visit.   Review of Systems  Constitutional: Negative for fever and chills.  HENT: Positive for sinus pressure, sneezing and sore throat.   Eyes: Positive for visual disturbance (double vision episodes approx. 6x/week related to myasthenia gravis). Negative for photophobia, pain, redness and itching.  Respiratory: Negative for shortness of breath and wheezing.   Psychiatric/Behavioral: Positive for sleep disturbance (waking up in the middle of  the night). The patient is nervous/anxious (stress related to work and her mother's illness).    Patient Active Problem List   Diagnosis Date Noted  . GERD (gastroesophageal reflux disease) 02/15/2014  . IBS (irritable bowel syndrome) 02/15/2014  . Osteopenia 02/08/2014  . Ocular myasthenia gravis ([place]) 10/07/2013  . Vitamin D deficiency 08/27/2013  . Hypothyroidism 08/27/2013  . Migraine 08/14/2013  . Bipolar 2 disorder ([place]) 03/05/2011  . Diabetes mellitus ([place]) 03/05/2011  . Fatty infiltration of liver 03/02/2011   Family History  Problem Relation Age of Onset  . Mental illness Father     Manic Depression  . Alcohol abuse Father   . Stroke Father   . Heart disease Father   . Arthritis Son   . Depression Son   . Breast cancer Maternal Grandmother   . Breast cancer Paternal Grandmother   . Cancer Mother 75    ovarian vs other abdominal   Social History   Social History  . Marital Status: Married    Spouse Name: [name] Loughry  . Number of Children: 2  . Years of Education: 14   Occupational History  . PROCESSING ASSISTANT    Social History Main Topics  . Smoking status: Never Smoker   . Smokeless tobacco: Never Used  . Alcohol Use: 0.6 oz/week    1 Standard drinks or equivalent per week     Comment: once every couple months  . Drug Use: No  . Sexual Activity:    Partners: Male    Birth Control/ Protection:  Post-menopausal   Other Topics Concern  . Not on file   Social History Narrative   Lives with her husband.  Their daughter, lives with them intermittently.  Her son is at [facility] and lives with them. Her husband has a child from a previous relationship who lives in [place].   Prior to Admission medications   Medication Sig Start Date End Date Taking? Authorizing Provider  azaTHIOprine (IMURAN) 50 MG tablet Take 50 mg by mouth 3 (three) times daily.    Yes Historical Provider, MD  calcium carbonate 1250 MG capsule Take 1,000 mg by mouth daily.   Yes Historical  Provider, MD  cetirizine (ZYRTEC) 10 MG tablet Take 10 mg by mouth daily.   Yes Historical Provider, MD  cholecalciferol (VITAMIN D) 1000 UNITS tablet Take 5,000 Units by mouth daily.   Yes Historical Provider, MD  clonazePAM (KLONOPIN) 1 MG tablet Take 4 mg by mouth at bedtime. May take 1/2 to 1 tablet during the day as needed   Yes Historical Provider, MD  divalproex (DEPAKOTE ER) 500 MG 24 hr tablet Take 1,500 mg by mouth at bedtime.    Yes Historical Provider, MD  eletriptan (RELPAX) 40 MG tablet Take 40 mg by mouth as needed for migraine. One tablet by mouth at onset of headache. May repeat in 2 hours if headache persists or recurs.   Yes Historical Provider, MD  fluticasone (FLONASE) 50 MCG/ACT nasal spray Place 2 sprays into the nose as needed for rhinitis.   Yes Historical Provider, MD  gabapentin (NEURONTIN) 600 MG tablet Take 600 mg by mouth at bedtime.     Yes Historical Provider, MD  Insulin Lispro, Human, 200 UNIT/ML SOPN Inject into the skin.   Yes Historical Provider, MD  lamoTRIgine (LAMICTAL) 100 MG tablet Take 200 mg by mouth every morning.    Yes Historical Provider, MD  LANTUS SOLOSTAR 100 UNIT/ML Solostar Pen  05/20/14  Yes Historical Provider, MD  Melatonin 10 MG CAPS Take by mouth.   Yes Historical Provider, MD  metFORMIN (GLUCOPHAGE-XR) 500 MG 24 hr tablet Take 1,000 mg by mouth 2 (two) times daily.   Yes Historical Provider, MD  naproxen (NAPROSYN) 500 MG tablet Take 500 mg by mouth 2 (two) times daily as needed (for pain).    Yes Historical Provider, MD  NEEDLE, DISP, 30 G 30G X 3/4" MISC Use as directed, daily blood sugars ( please dispense what patient had previously) 07/14/13  Yes Thao P Le, DO  omeprazole (PRILOSEC) 40 MG capsule Take 1 capsule (40 mg total) by mouth daily. 02/08/14  Yes Chelle Jeffery, PA-C  ondansetron (ZOFRAN-ODT) 8 MG disintegrating tablet Take 0.5-1 tablets (4-8 mg total) by mouth every 8 (eight) hours as needed for nausea. 08/26/14  Yes Chelle Jeffery,  PA-C  ONETOUCH VERIO test strip  01/25/14  Yes Historical Provider, MD  OXcarbazepine (TRILEPTAL) 150 MG tablet Take 150 mg by mouth at bedtime.    Yes Historical Provider, MD  pravastatin (PRAVACHOL) 40 MG tablet Take 40 mg by mouth daily.   Yes Historical Provider, MD  predniSONE (DELTASONE) 10 MG tablet Take 10 mg by mouth 1 day or 1 dose. Now taking 1/2 tablet   Yes Historical Provider, MD  pregabalin (LYRICA) 75 MG capsule Take 75 mg by mouth every morning.    Yes Historical Provider, MD  promethazine (PHENERGAN) 25 MG suppository Place 1 suppository (25 mg total) rectally every 6 (six) hours as needed for nausea or vomiting. 01/06/14  Yes Chelle  Jacqulynn Cadet, PA-C  sertraline (ZOLOFT) 25 MG tablet Take 25 mg by mouth daily.   Yes Historical Provider, MD  topiramate (TOPAMAX) 25 MG tablet Take 25 mg by mouth 2 (two) times daily.   Yes Historical Provider, MD  TRULICITY 1.5 EB/5.8XE SOPN INJECT 1.5 MG WEEKLY FOR DIABETES 09/22/14  Yes Historical Provider, MD  VIBERZI 75 MG TABS Take 1 tablet by mouth 2 (two) times daily. 10/18/14  Yes Historical Provider, MD   Allergies  Allergen Reactions  . Mestinon [Pyridostigmine Bromide] Nausea And Vomiting    "violently ill"  . Risperidone And Related Other (See Comments)    liver damage      Objective:   Physical Exam  Constitutional: She is oriented to person, place, and time. She appears well-developed and well-nourished. No distress.  BP 102/72 mmHg  Pulse 108  Temp(Src) 98 F (36.7 C)  Resp 16  Ht 5' (1.524 m)  Wt 200 lb (90.719 kg)  BMI 39.06 kg/m2  HENT:  Head: Normocephalic and atraumatic.  Eyes: Conjunctivae and EOM are normal. Pupils are equal, round, and reactive to light. No scleral icterus.  Wearing eyeglasses today.  Cardiovascular: Normal rate, regular rhythm, normal heart sounds and intact distal pulses.  Exam reveals no gallop and no friction rub.   No murmur heard. Pulmonary/Chest: Effort normal and breath sounds normal. No  respiratory distress. She has no wheezes. She has no rales.  Neurological: She is alert and oriented to person, place, and time.  Skin: Skin is warm and dry. She is not diaphoretic.  Psychiatric: She has a normal mood and affect. Her behavior is normal. Judgment and thought content normal.      Assessment & Plan:

## 2014-12-22 NOTE — Progress Notes (Signed)
Chief Complaint  Patient presents with  . Follow-up  . paperwork    History of Present Illness: Patient presents for follow-up of ocular myasthenia gravis.   Patient was diagnosed in Sept. 2015. Doing well overall. Saw her optometrist today for her annual eye exam today, started on an occluded contact lens for the left eye. Previously wore eye patches and darkened sunglasses in the left eye for double vision. Having double vision episodes approx. 6x per month. Was previously having episodes every day.   Type DM II: Still checking blood sugars twice daily. Usually her sugars run in the 70-90 range. Was 133 this AM, but believes this is d/t stress from her current work situation and her mother's recent illness. Currently on sliding scale insulin and is followed by endocrinology.   IBS: Still taking Viberzi 75 mg once daily. Continues to help. Symptoms have been worse over the last 2 days, suspects this is also d/t her recent stress.   Has been sleeping poorly for the past 2 days. Wakes up in the middle of the night. Has been taking 4 Klonopin the last 2 days with good anxiety relief.   Patient reports she is still sneezing, dry coughing, and having sinus pressure. Her symptoms initially improved for a couple of weeks since her last visit, but this week they have been worse. Uses OTC cetirizine and occasional OTC nasal spray with moderate relief.   No other concerns on today's visit.    Allergies  Allergen Reactions  . Mestinon [Pyridostigmine Bromide] Nausea And Vomiting    "violently ill"  . Risperidone And Related Other (See Comments)    liver damage    Prior to Admission medications   Medication Sig Start Date End Date Taking? Authorizing Provider  azaTHIOprine (IMURAN) 50 MG tablet Take 50 mg by mouth 3 (three) times daily.    Yes Historical Provider, MD  calcium carbonate 1250 MG capsule Take 1,000 mg by mouth daily.   Yes Historical Provider, MD  cetirizine (ZYRTEC) 10 MG tablet  Take 10 mg by mouth daily.   Yes Historical Provider, MD  cholecalciferol (VITAMIN D) 1000 UNITS tablet Take 5,000 Units by mouth daily.   Yes Historical Provider, MD  clonazePAM (KLONOPIN) 1 MG tablet Take 4 mg by mouth at bedtime. May take 1/2 to 1 tablet during the day as needed   Yes Historical Provider, MD  divalproex (DEPAKOTE ER) 500 MG 24 hr tablet Take 1,500 mg by mouth at bedtime.    Yes Historical Provider, MD  eletriptan (RELPAX) 40 MG tablet Take 40 mg by mouth as needed for migraine. One tablet by mouth at onset of headache. May repeat in 2 hours if headache persists or recurs.   Yes Historical Provider, MD  fluticasone (FLONASE) 50 MCG/ACT nasal spray Place 2 sprays into the nose as needed for rhinitis.   Yes Historical Provider, MD  gabapentin (NEURONTIN) 600 MG tablet Take 600 mg by mouth at bedtime.     Yes Historical Provider, MD  Insulin Lispro, Human, 200 UNIT/ML SOPN Inject into the skin.   Yes Historical Provider, MD  lamoTRIgine (LAMICTAL) 100 MG tablet Take 200 mg by mouth every morning.    Yes Historical Provider, MD  LANTUS SOLOSTAR 100 UNIT/ML Solostar Pen  05/20/14  Yes Historical Provider, MD  Melatonin 10 MG CAPS Take by mouth.   Yes Historical Provider, MD  metFORMIN (GLUCOPHAGE-XR) 500 MG 24 hr tablet Take 1,000 mg by mouth 2 (two) times daily.   Yes  Historical Provider, MD  naproxen (NAPROSYN) 500 MG tablet Take 500 mg by mouth 2 (two) times daily as needed (for pain).    Yes Historical Provider, MD  NEEDLE, DISP, 30 G 30G X 3/4" MISC Use as directed, daily blood sugars ( please dispense what patient had previously) 07/14/13  Yes Thao P Le, DO  omeprazole (PRILOSEC) 40 MG capsule Take 1 capsule (40 mg total) by mouth daily. 02/08/14  Yes Shahmeer Bunn, PA-C  ondansetron (ZOFRAN-ODT) 8 MG disintegrating tablet Take 0.5-1 tablets (4-8 mg total) by mouth every 8 (eight) hours as needed for nausea. 08/26/14  Yes Ulla Mckiernan, PA-C  ONETOUCH VERIO test strip  01/25/14  Yes  Historical Provider, MD  OXcarbazepine (TRILEPTAL) 150 MG tablet Take 150 mg by mouth at bedtime.    Yes Historical Provider, MD  pravastatin (PRAVACHOL) 40 MG tablet Take 40 mg by mouth daily.   Yes Historical Provider, MD  predniSONE (DELTASONE) 10 MG tablet Take 10 mg by mouth 1 day or 1 dose. Now taking 1/2 tablet   Yes Historical Provider, MD  pregabalin (LYRICA) 75 MG capsule Take 75 mg by mouth every morning.    Yes Historical Provider, MD  promethazine (PHENERGAN) 25 MG suppository Place 1 suppository (25 mg total) rectally every 6 (six) hours as needed for nausea or vomiting. 01/06/14  Yes Demitri Kucinski, PA-C  sertraline (ZOLOFT) 25 MG tablet Take 25 mg by mouth daily.   Yes Historical Provider, MD  topiramate (TOPAMAX) 25 MG tablet Take 25 mg by mouth 2 (two) times daily.   Yes Historical Provider, MD  TRULICITY 1.5 HE/1.7EY SOPN INJECT 1.5 MG WEEKLY FOR DIABETES 09/22/14  Yes Historical Provider, MD  VIBERZI 75 MG TABS Take 1 tablet by mouth 2 (two) times daily. 10/18/14  Yes Historical Provider, MD    Patient Active Problem List   Diagnosis Date Noted  . GERD (gastroesophageal reflux disease) 02/15/2014  . IBS (irritable bowel syndrome) 02/15/2014  . Osteopenia 02/08/2014  . Ocular myasthenia gravis ([place]) 10/07/2013  . Vitamin D deficiency 08/27/2013  . Hypothyroidism 08/27/2013  . Migraine 08/14/2013  . Bipolar 2 disorder ([place]) 03/05/2011  . Diabetes mellitus ([place]) 03/05/2011  . Fatty infiltration of liver 03/02/2011     Physical Exam  Constitutional: She is oriented to person, place, and time. Vital signs are normal. She appears well-developed and well-nourished. She is active and cooperative. No distress.  BP 102/72 mmHg  Pulse 108  Temp(Src) 98 F (36.7 C)  Resp 16  Ht 5' (1.524 m)  Wt 200 lb (90.719 kg)  BMI 39.06 kg/m2  HENT:  Head: Normocephalic and atraumatic.  Right Ear: Hearing normal.  Left Ear: Hearing normal.  Eyes: Conjunctivae are normal. No scleral  icterus.  Neck: Normal range of motion. Neck supple. No thyromegaly present.  Cardiovascular: Normal rate, regular rhythm and normal heart sounds.   Pulses:      Radial pulses are 2+ on the right side, and 2+ on the left side.  Pulmonary/Chest: Effort normal and breath sounds normal.  Lymphadenopathy:       Head (right side): No tonsillar, no preauricular, no posterior auricular and no occipital adenopathy present.       Head (left side): No tonsillar, no preauricular, no posterior auricular and no occipital adenopathy present.    She has no cervical adenopathy.       Right: No supraclavicular adenopathy present.       Left: No supraclavicular adenopathy present.  Neurological: She is alert  and oriented to person, place, and time. No sensory deficit.  Skin: Skin is warm, dry and intact. No rash noted. No cyanosis or erythema. Nails show no clubbing.  Psychiatric: She has a normal mood and affect. Her speech is normal and behavior is normal.      ASSESSMENT & PLAN:  1. Ocular myasthenia gravis ([place]) Continue per specialists. Form completed today.   Fara Chute, PA-C Physician Assistant-Certified [facility]

## 2014-12-28 ENCOUNTER — Ambulatory Visit: Payer: BC Managed Care – PPO | Admitting: Physician Assistant

## 2015-01-10 ENCOUNTER — Encounter: Payer: Self-pay | Admitting: Physician Assistant

## 2015-04-14 ENCOUNTER — Encounter: Payer: Self-pay | Admitting: Physician Assistant

## 2015-07-11 ENCOUNTER — Encounter: Payer: Self-pay | Admitting: Physician Assistant

## 2015-10-16 ENCOUNTER — Other Ambulatory Visit: Payer: Self-pay | Admitting: Physician Assistant

## 2015-11-02 ENCOUNTER — Ambulatory Visit (INDEPENDENT_AMBULATORY_CARE_PROVIDER_SITE_OTHER): Payer: BC Managed Care – PPO

## 2015-11-02 ENCOUNTER — Telehealth: Payer: Self-pay | Admitting: *Deleted

## 2015-11-02 ENCOUNTER — Other Ambulatory Visit: Payer: Self-pay | Admitting: Emergency Medicine

## 2015-11-02 ENCOUNTER — Ambulatory Visit (INDEPENDENT_AMBULATORY_CARE_PROVIDER_SITE_OTHER): Payer: BC Managed Care – PPO | Admitting: Emergency Medicine

## 2015-11-02 VITALS — BP 132/80 | HR 95 | Temp 99.2°F | Resp 16 | Ht 60.0 in | Wt 189.2 lb

## 2015-11-02 DIAGNOSIS — S40012A Contusion of left shoulder, initial encounter: Secondary | ICD-10-CM

## 2015-11-02 DIAGNOSIS — S8001XA Contusion of right knee, initial encounter: Secondary | ICD-10-CM | POA: Diagnosis not present

## 2015-11-02 DIAGNOSIS — S42202A Unspecified fracture of upper end of left humerus, initial encounter for closed fracture: Secondary | ICD-10-CM | POA: Diagnosis not present

## 2015-11-02 MED ORDER — HYDROCODONE-ACETAMINOPHEN 5-325 MG PO TABS: 1.0000 | ORAL_TABLET | Freq: Four times a day (QID) | ORAL | 0 refills | Status: DC | PRN

## 2015-11-02 NOTE — Patient Instructions (Addendum)
Please wear your sling. We will call you with the appointment with the orthopedist. Apply ice to shoulder as often as possible.    IF you received an x-ray today, you will receive an invoice from [facility]. Please contact [facility] at [phone] with questions or concerns regarding your invoice.   IF you received labwork today, you will receive an invoice from [employer]. Please contact [facility] at [phone] with questions or concerns regarding your invoice.   Our billing staff will not be able to assist you with questions regarding bills from these companies.  You will be contacted with the lab results as soon as they are available. The fastest way to get your results is to activate your My Chart account. Instructions are located on the last page of this paperwork. If you have not heard from [place] regarding the results in 2 weeks, please contact this office.
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
## 2015-11-02 NOTE — Telephone Encounter (Signed)
Called patient [facility] appt 9/14 @ 8:15 [facility]  With Dr Erlinda Hong.

## 2015-11-08 ENCOUNTER — Encounter: Payer: Self-pay | Admitting: Physician Assistant

## 2015-11-24 ENCOUNTER — Ambulatory Visit (INDEPENDENT_AMBULATORY_CARE_PROVIDER_SITE_OTHER): Payer: BC Managed Care – PPO | Admitting: Orthopaedic Surgery

## 2015-11-24 DIAGNOSIS — S42302D Unspecified fracture of shaft of humerus, left arm, subsequent encounter for fracture with routine healing: Secondary | ICD-10-CM

## 2015-11-25 ENCOUNTER — Ambulatory Visit (INDEPENDENT_AMBULATORY_CARE_PROVIDER_SITE_OTHER): Payer: BC Managed Care – PPO | Admitting: Physician Assistant

## 2015-11-25 VITALS — BP 128/80 | HR 89 | Temp 97.3°F | Resp 17 | Ht 60.0 in | Wt 188.0 lb

## 2015-11-25 DIAGNOSIS — G7000 Myasthenia gravis without (acute) exacerbation: Secondary | ICD-10-CM | POA: Diagnosis not present

## 2015-11-25 DIAGNOSIS — Z794 Long term (current) use of insulin: Secondary | ICD-10-CM

## 2015-11-25 DIAGNOSIS — S42202D Unspecified fracture of upper end of left humerus, subsequent encounter for fracture with routine healing: Secondary | ICD-10-CM

## 2015-11-25 DIAGNOSIS — E119 Type 2 diabetes mellitus without complications: Secondary | ICD-10-CM

## 2015-11-25 LAB — MICROALBUMIN, URINE

## 2015-11-25 NOTE — Progress Notes (Signed)
Patient ID: Michelle Torres, female    DOB: [birth year], 55 y.o.   MRN: [id number]  PCP: Harrison Mons, PA-C  Subjective:   Chief Complaint  Patient presents with  . Diabetes    HPI Presents for several outstanding health maintenance items.  Was prepared to return to work on 11/20/2015, but the fell at home and broke the LEFT proximal humerus. 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 If she is ready to return to work without restrictions by then, she'll need me to write the letter to release her. If she is not ready by then, she will lose her job. She is working on PT, getting her mobility and strength back.  She needs a urine microalbumin, not done at her last visit with endocrinology. Next appointment there 12/26/2015. A1C was 5.3% on 09/29/2015. Foot exam performed then, as well. Additionally, she has been diagnosed with hypothyroidism and started on Synthroid.  Her MG specialists request a CBC with diff, CMET every 6 months, and have advised her that she can have that done locally, rather than travelling to [place] for that..    Review of Systems No chest pain, SOB, HA, dizziness, vision change, N/V, diarrhea, constipation, dysuria, urinary urgency or frequency, or rash.     Patient Active Problem List   Diagnosis Date Noted  . GERD (gastroesophageal reflux disease) 02/15/2014  . IBS (irritable bowel syndrome) 02/15/2014  . Osteopenia 02/08/2014  . Ocular myasthenia gravis ([place]) 10/07/2013  . Vitamin D deficiency 08/27/2013  . Hypothyroidism 08/27/2013  . Migraine 08/14/2013  . Bipolar 2 disorder ([place]) 03/05/2011  . Diabetes mellitus ([place]) 03/05/2011  . Fatty infiltration of liver 03/02/2011     Prior to Admission medications   Medication Sig Start Date End Date Taking? Authorizing Provider  alosetron (LOTRONEX) 0.5 MG tablet Take 0.5 mg by mouth 2 (two) times daily.   Yes Historical Provider, MD  azaTHIOprine (IMURAN) 50 MG tablet Take 50 mg by  mouth 3 (three) times daily.    Yes Historical Provider, MD  buPROPion (WELLBUTRIN XL) 150 MG 24 hr tablet Take 150 mg by mouth daily.   Yes Historical Provider, MD  calcium carbonate 1250 MG capsule Take 1,000 mg by mouth daily.   Yes Historical Provider, MD  cetirizine (ZYRTEC) 10 MG tablet Take 10 mg by mouth daily.   Yes Historical Provider, MD  cholecalciferol (VITAMIN D) 1000 UNITS tablet Take 5,000 Units by mouth daily.   Yes Historical Provider, MD  clonazePAM (KLONOPIN) 1 MG tablet Take 4 mg by mouth at bedtime. May take 1/2 to 1 tablet during the day as needed   Yes Historical Provider, MD  divalproex (DEPAKOTE ER) 500 MG 24 hr tablet Take 1,500 mg by mouth at bedtime.    Yes Historical Provider, MD  eletriptan (RELPAX) 40 MG tablet Take 40 mg by mouth as needed for migraine. One tablet by mouth at onset of headache. May repeat in 2 hours if headache persists or recurs.   Yes Historical Provider, MD  fluticasone (FLONASE) 50 MCG/ACT nasal spray Place 2 sprays into the nose as needed for rhinitis.   Yes Historical Provider, MD  gabapentin (NEURONTIN) 600 MG tablet Take 600 mg by mouth at bedtime.     Yes Historical Provider, MD  HYDROcodone-acetaminophen (NORCO) 5-325 MG tablet Take 1 tablet by mouth every 6 (six) hours as needed for moderate pain. 11/02/15  Yes Darlyne Russian, MD  Insulin Lispro (HUMALOG KWIKPEN) 200 UNIT/ML SOPN Inject into the skin.  Yes Historical Provider, MD  lamoTRIgine (LAMICTAL) 100 MG tablet Take 200 mg by mouth every morning.    Yes Historical Provider, MD  Melatonin 10 MG CAPS Take by mouth.   Yes Historical Provider, MD  metFORMIN (GLUCOPHAGE-XR) 500 MG 24 hr tablet Take 1,000 mg by mouth 2 (two) times daily.   Yes Historical Provider, MD  naproxen (NAPROSYN) 500 MG tablet Take 500 mg by mouth 2 (two) times daily as needed (for pain).    Yes Historical Provider, MD  NEEDLE, DISP, 30 G 30G X 3/4" MISC Use as directed, daily blood sugars ( please dispense what  patient had previously) 07/14/13  Yes Thao P Le, DO  omeprazole (PRILOSEC) 40 MG capsule Take 1 capsule (40 mg total) by mouth daily. 02/08/14  Yes Verdon Ferrante, PA-C  ondansetron (ZOFRAN-ODT) 8 MG disintegrating tablet Take 0.5-1 tablets (4-8 mg total) by mouth every 8 (eight) hours as needed for nausea. 08/26/14  Yes Jawann Urbani, PA-C  ONETOUCH VERIO test strip  01/25/14  Yes Historical Provider, MD  OXcarbazepine (TRILEPTAL) 150 MG tablet Take 150 mg by mouth at bedtime.    Yes Historical Provider, MD  pravastatin (PRAVACHOL) 40 MG tablet Take 40 mg by mouth daily.   Yes Historical Provider, MD  pregabalin (LYRICA) 75 MG capsule Take 75 mg by mouth every morning.    Yes Historical Provider, MD  promethazine (PHENERGAN) 25 MG suppository Place 1 suppository (25 mg total) rectally every 6 (six) hours as needed for nausea or vomiting. 01/06/14  Yes Chasey Dull, PA-C  sertraline (ZOLOFT) 25 MG tablet Take 25 mg by mouth daily.   Yes Historical Provider, MD  topiramate (TOPAMAX) 25 MG tablet Take 25 mg by mouth 2 (two) times daily.   Yes Historical Provider, MD  TRULICITY 1.5 0000000 SOPN INJECT 1.5 MG WEEKLY FOR DIABETES 09/22/14  Yes Historical Provider, MD     Allergies  Allergen Reactions  . Mestinon [Pyridostigmine Bromide] Nausea And Vomiting    "violently ill"  . Risperidone And Related Other (See Comments)    liver damage       Objective:  Physical Exam  Constitutional: She is oriented to person, place, and time. She appears well-developed and well-nourished. She is active and cooperative. No distress.  BP 128/80 (BP Location: Right Arm, Patient Position: Sitting, Cuff Size: Normal)   Pulse 89   Temp 97.3 F (36.3 C) (Oral)   Resp 17   Ht 5' (1.524 m)   Wt 188 lb (85.3 kg)   SpO2 99%   BMI 36.72 kg/m   HENT:  Head: Normocephalic and atraumatic.  Right Ear: Hearing normal.  Left Ear: Hearing normal.  Eyes: Conjunctivae are normal. No scleral icterus.  Neck: Normal  range of motion. Neck supple. No thyromegaly present.  Cardiovascular: Normal rate, regular rhythm and normal heart sounds.   Pulses:      Radial pulses are 2+ on the right side, and 2+ on the left side.  Pulmonary/Chest: Effort normal and breath sounds normal.  Musculoskeletal:  Limited ROM of the LEFT shoulder due to healing proximal humerus fracture. Large area of resolving ecchymosis, consistent with her injury, noted of the upper arm. Resolving abrasion and ecchymosis of the RIGHT knee.  Lymphadenopathy:       Head (right side): No tonsillar, no preauricular, no posterior auricular and no occipital adenopathy present.       Head (left side): No tonsillar, no preauricular, no posterior auricular and no occipital adenopathy present.  She has no cervical adenopathy.       Right: No supraclavicular adenopathy present.       Left: No supraclavicular adenopathy present.  Neurological: She is alert and oriented to person, place, and time. No sensory deficit.  Skin: Skin is warm, dry and intact. No rash noted. No cyanosis or erythema. Nails show no clubbing.  Psychiatric: She has a normal mood and affect. Her speech is normal and behavior is normal.           Assessment & Plan:   1. Ocular myasthenia gravis ([place]) Stable. Double vision resolved. Plan CBC and CMET Q 6 months, hopefully coordinated with labs done with endocrinology.  2. Type 2 diabetes mellitus without complication, with long-term current use of insulin ([place]) Managed by endocrinology, but urine microalbumin not done. Flu vaccine, A1C, etc entered in the electronic record to satisfy the outstanding items. She is current with diabetic eye exam as well. - Microalbumin, urine  3. Closed fracture of proximal end of left humerus with routine healing, unspecified fracture morphology, subsequent encounter Continue care per orthopedics. When she is released, I will write her official return to work letter. We will hope she is  able to RTW without limitations before her disability expires 01/11/16.   Fara Chute, PA-C Physician Assistant-Certified [facility]

## 2015-11-25 NOTE — Patient Instructions (Addendum)
Ask Dr. Elyse Hsu to do a CBC with diff when you have your labs with him in February.    IF you received an x-ray today, you will receive an invoice from [facility]. Please contact [facility] at [phone] with questions or concerns regarding your invoice.   IF you received labwork today, you will receive an invoice from [employer]. Please contact [facility] at [phone] with questions or concerns regarding your invoice.   Our billing staff will not be able to assist you with questions regarding bills from these companies.  You will be contacted with the lab results as soon as they are available. The fastest way to get your results is to activate your My Chart account. Instructions are located on the last page of this paperwork. If you have not heard from [place] regarding the results in 2 weeks, please contact this office.

## 2015-12-15 ENCOUNTER — Ambulatory Visit (INDEPENDENT_AMBULATORY_CARE_PROVIDER_SITE_OTHER): Payer: BC Managed Care – PPO

## 2015-12-15 ENCOUNTER — Ambulatory Visit (INDEPENDENT_AMBULATORY_CARE_PROVIDER_SITE_OTHER): Payer: BC Managed Care – PPO | Admitting: Orthopaedic Surgery

## 2015-12-15 ENCOUNTER — Encounter (INDEPENDENT_AMBULATORY_CARE_PROVIDER_SITE_OTHER): Payer: Self-pay | Admitting: Orthopaedic Surgery

## 2015-12-15 DIAGNOSIS — M25512 Pain in left shoulder: Secondary | ICD-10-CM | POA: Diagnosis not present

## 2015-12-15 DIAGNOSIS — S42202D Unspecified fracture of upper end of left humerus, subsequent encounter for fracture with routine healing: Secondary | ICD-10-CM

## 2015-12-15 HISTORY — DX: Unspecified fracture of upper end of left humerus, subsequent encounter for fracture with routine healing: S42.202D

## 2015-12-15 NOTE — Progress Notes (Signed)
Office Visit Note   Patient: Michelle Torres           Date of Birth: 1960/04/22           MRN: [id number] Visit Date: 12/15/2015              Requested by: Harrison Mons, PA-C [address] [place],  [address] PCP: Harrison Mons, PA-C   Assessment & Plan: Visit Diagnoses:  1. Closed fracture of proximal end of left humerus with routine healing, subsequent encounter   2. Acute pain of left shoulder     Plan:  - begin PT for shoulder rehab - naproxen prn for pain - f/u 6 weeks - repeat left shoulder xrays  Follow-Up Instructions: Return in about 6 weeks (around 01/26/2016) for recheck left proximal humerus.   Orders:  Orders Placed This Encounter  Procedures  . XR Shoulder Left   No orders of the defined types were placed in this encounter.     Procedures: No procedures performed   Clinical Data: No additional findings.   Subjective: Chief Complaint  Patient presents with  . Left Shoulder - Fracture, Follow-up    6 wk f/u visit for left proximal humerus fx.  Feeling better.  Doing pendulums.    Review of Systems   Objective: Vital Signs: There were no vitals taken for this visit.  Physical Exam  Left Shoulder Exam   Tenderness  The patient is experiencing no tenderness.     Other  Sensation: normal Pulse: present   Comments:  ROM and strength improving and as expected      Specialty Comments:  No specialty comments available.  Imaging: Xr Shoulder Left  Result Date: 12/15/2015 Stable fracture alignment. Callus formation consistent with healing.  No interval worsening    PMFS History: Patient Active Problem List   Diagnosis Date Noted  . Closed fracture of proximal end of left humerus with routine healing, subsequent encounter 12/15/2015  . GERD (gastroesophageal reflux disease) 02/15/2014  . IBS (irritable bowel syndrome) 02/15/2014  . Osteopenia 02/08/2014  . Ocular myasthenia gravis ([place]) 10/07/2013  . Vitamin D  deficiency 08/27/2013  . Hypothyroidism 08/27/2013  . Migraine 08/14/2013  . Bipolar 2 disorder ([place]) 03/05/2011  . Diabetes mellitus ([place]) 03/05/2011  . Fatty infiltration of liver 03/02/2011   Past Medical History:  Diagnosis Date  . Anxiety   . Bipolar disorder ([place])   . Depression   . Diabetes mellitus   . Fatty liver disease, nonalcoholic   . GERD (gastroesophageal reflux disease)   . High cholesterol   . Hypothyroidism     Family History  Problem Relation Age of Onset  . Mental illness Father     Manic Depression  . Alcohol abuse Father   . Stroke Father   . Heart disease Father   . Arthritis Son   . Depression Son   . Cancer Mother 36    peritoneal  . Breast cancer Maternal Grandmother   . Breast cancer Paternal Grandmother     Past Surgical History:  Procedure Laterality Date  . DILATION AND CURETTAGE OF UTERUS    . ERCP  03/03/2011   Procedure: ENDOSCOPIC RETROGRADE CHOLANGIOPANCREATOGRAPHY (ERCP);  Surgeon: Lafayette Dragon, MD;  Location: [facility] ENDOSCOPY;  Service: Endoscopy;  Laterality: N/A;  . [place]   Social History   Occupational History  . [employer]ESSING ASSIST[employer]   Social History Main Topics  . Smoking status: Never Smoker  . Smokeless tobacco:  Never Used  . Alcohol use 0.6 oz/week    1 Standard drinks or equivalent per week     Comment: once every couple months  . Drug use: No  . Sexual activity: Yes    Partners: Male    Birth control/ protection: Post-menopausal

## 2015-12-19 ENCOUNTER — Telehealth (INDEPENDENT_AMBULATORY_CARE_PROVIDER_SITE_OTHER): Payer: Self-pay | Admitting: *Deleted

## 2015-12-19 NOTE — Telephone Encounter (Signed)
Faxed to [facility], pt aware

## 2015-12-19 NOTE — Telephone Encounter (Signed)
Pt called stating she has lost her PT form to [facility] and wants to know if she can get a copy, C/B [phone]

## 2015-12-21 LAB — HEMOGLOBIN A1C: HEMOGLOBIN A1C: 5.4

## 2015-12-27 ENCOUNTER — Encounter: Payer: Self-pay | Admitting: Physician Assistant

## 2016-01-02 ENCOUNTER — Ambulatory Visit (INDEPENDENT_AMBULATORY_CARE_PROVIDER_SITE_OTHER): Payer: BC Managed Care – PPO | Admitting: Physician Assistant

## 2016-01-02 VITALS — BP 114/70 | HR 90 | Temp 98.3°F | Resp 18 | Ht 60.0 in | Wt 192.0 lb

## 2016-01-02 DIAGNOSIS — L723 Sebaceous cyst: Secondary | ICD-10-CM

## 2016-01-02 DIAGNOSIS — G7000 Myasthenia gravis without (acute) exacerbation: Secondary | ICD-10-CM | POA: Diagnosis not present

## 2016-01-02 DIAGNOSIS — Z794 Long term (current) use of insulin: Secondary | ICD-10-CM | POA: Diagnosis not present

## 2016-01-02 DIAGNOSIS — S42202D Unspecified fracture of upper end of left humerus, subsequent encounter for fracture with routine healing: Secondary | ICD-10-CM

## 2016-01-02 DIAGNOSIS — E119 Type 2 diabetes mellitus without complications: Secondary | ICD-10-CM | POA: Diagnosis not present

## 2016-01-02 DIAGNOSIS — L089 Local infection of the skin and subcutaneous tissue, unspecified: Secondary | ICD-10-CM

## 2016-01-02 MED ORDER — DOXYCYCLINE HYCLATE 100 MG PO CAPS: 100.0000 mg | ORAL_CAPSULE | Freq: Two times a day (BID) | ORAL | 0 refills | Status: AC

## 2016-01-02 MED ORDER — HYDROCODONE-ACETAMINOPHEN 5-325 MG PO TABS: 1.0000 | ORAL_TABLET | Freq: Four times a day (QID) | ORAL | 0 refills | Status: DC | PRN

## 2016-01-02 NOTE — Progress Notes (Signed)
Subjective:    Patient ID: Michelle Torres, female    DOB: May 16, 1960, 55 y.o.   MRN: [id number]  Patient Care Team: Harrison Mons, PA-C as PCP - General (Physician Assistant) Pearson Grippe, MD as Referring Physician (Psychiatry) Arvella Nigh, MD as Consulting Physician (Obstetrics and Gynecology) Heather [place], [place] (Optometry) Tedra Coupe, MD as Referring Physician (Neurology) Darlyne Russian, MD as Consulting Physician (Family Medicine) Lorne Skeens, MD as Consulting Physician (Endocrinology) Carol Ada, MD as Consulting Physician (Gastroenterology) Leandrew Koyanagi, MD as Attending Physician (Orthopedic Surgery)  Chief Complaint  Patient presents with  . Follow-up   HPI: Presents for evaluation of ability to return to work.   She has been out of work for several months due to several problems. 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 Denies current diplopia, blurry vision, photophobia, or other visual disturbances. No longer taking Prednisone. She was supposed to return to work about a month ago after this resolved but then she fractured her humerus.   Her humeral fracture has been healing well, she is followed by orthopedics for this injury. She has been attending physical therapy for 1 hour 2x/week and doing her home exercises 3x/day. She has some continued pain and swelling after her PT sessions. Notes some continued numbness and tingling in her left arm from the injruy.Takes 1/2 tablet Hydrocodone-Acetaminophen at night to help her sleep.  She is seen by an endocrinologist, Dr. Elyse Hsu, for her diabetes. Last evaluation of HgA1C measured it at 5.4% 4 days ago. Regularly visits opthamologist for eye exams. Measures her blood sugars at home which are routinely low in the mornings (65-80 mg/dL) and the highest measurement she has seen recently is 200 mg/dL  In the past few days she has noticed a  small red bump on back of neck which she would like to have evaluated today as well. Denies drainage from the site, notes some swelling and erythema.   Review of Systems  Constitutional: Negative for appetite change, chills, diaphoresis, fever and unexpected weight change.  HENT: Positive for sneezing. Negative for congestion, ear discharge, ear pain, postnasal drip, rhinorrhea, sinus pain, sinus pressure, sore throat and trouble swallowing.   Eyes: Negative for photophobia, pain, discharge, redness, itching and visual disturbance.  Respiratory: Negative for cough, chest tightness, shortness of breath and wheezing.   Cardiovascular: Negative for chest pain and palpitations.  Gastrointestinal: Positive for abdominal pain, constipation and diarrhea. Negative for blood in stool, nausea and vomiting.  Endocrine: Negative for polydipsia and polyuria.  Genitourinary: Negative for dysuria, frequency, hematuria and urgency.  Musculoskeletal: Positive for arthralgias and myalgias. Negative for back pain, neck pain and neck stiffness.  Skin: Negative for rash.  Neurological: Positive for numbness and headaches. Negative for dizziness, syncope and weakness.  Psychiatric/Behavioral: Negative for dysphoric mood.   Allergies  Allergen Reactions  . Mestinon [Pyridostigmine Bromide] Nausea And Vomiting    "violently ill"  . Risperidone And Related Other (See Comments)    liver damage   Prior to Admission medications   Medication Sig Start Date End Date Taking? Authorizing Provider  alosetron (LOTRONEX) 0.5 MG tablet Take 0.5 mg by mouth 2 (two) times daily.   Yes Historical Provider, MD  azaTHIOprine (IMURAN) 50 MG tablet Take 50 mg by mouth 3 (three) times daily.    Yes Historical Provider, MD  buPROPion (WELLBUTRIN XL) 150 MG 24 hr tablet Take 150 mg by mouth daily.  Yes Historical Provider, MD  calcium carbonate 1250 MG capsule Take 1,000 mg by mouth daily.   Yes Historical Provider, MD  cetirizine  (ZYRTEC) 10 MG tablet Take 10 mg by mouth daily.   Yes Historical Provider, MD  cholecalciferol (VITAMIN D) 1000 UNITS tablet Take 5,000 Units by mouth daily.   Yes Historical Provider, MD  clonazePAM (KLONOPIN) 1 MG tablet Take 4 mg by mouth at bedtime. May take 1/2 to 1 tablet during the day as needed   Yes Historical Provider, MD  divalproex (DEPAKOTE ER) 500 MG 24 hr tablet Take 1,500 mg by mouth at bedtime.    Yes Historical Provider, MD  eletriptan (RELPAX) 40 MG tablet Take 40 mg by mouth as needed for migraine. One tablet by mouth at onset of headache. May repeat in 2 hours if headache persists or recurs.   Yes Historical Provider, MD  fluticasone (FLONASE) 50 MCG/ACT nasal spray Place 2 sprays into the nose as needed for rhinitis.   Yes Historical Provider, MD  gabapentin (NEURONTIN) 600 MG tablet Take 600 mg by mouth at bedtime.     Yes Historical Provider, MD  HYDROcodone-acetaminophen (NORCO) 5-325 MG tablet Take 1 tablet by mouth every 6 (six) hours as needed for moderate pain. 01/02/16  Yes Chelle Jeffery, PA-C  insulin degludec (TRESIBA) 100 UNIT/ML SOPN FlexTouch Pen Inject into the skin daily at 10 pm.   Yes Historical Provider, MD  Insulin Lispro (HUMALOG KWIKPEN) 200 UNIT/ML SOPN Inject into the skin.   Yes Historical Provider, MD  lamoTRIgine (LAMICTAL) 100 MG tablet Take 200 mg by mouth every morning.    Yes Historical Provider, MD  levothyroxine (SYNTHROID, LEVOTHROID) 75 MCG tablet Take 75 mcg by mouth daily before breakfast.   Yes Historical Provider, MD  Melatonin 10 MG CAPS Take by mouth.   Yes Historical Provider, MD  metFORMIN (GLUCOPHAGE-XR) 500 MG 24 hr tablet Take 1,000 mg by mouth 2 (two) times daily.   Yes Historical Provider, MD  naproxen (NAPROSYN) 500 MG tablet Take 500 mg by mouth 2 (two) times daily as needed (for pain).    Yes Historical Provider, MD  NEEDLE, DISP, 30 G 30G X 3/4" MISC Use as directed, daily blood sugars ( please dispense what patient had  previously) 07/14/13  Yes Thao P Le, DO  omeprazole (PRILOSEC) 40 MG capsule Take 1 capsule (40 mg total) by mouth daily. 02/08/14  Yes Chelle Jeffery, PA-C  ondansetron (ZOFRAN-ODT) 8 MG disintegrating tablet Take 0.5-1 tablets (4-8 mg total) by mouth every 8 (eight) hours as needed for nausea. 08/26/14  Yes Chelle Jeffery, PA-C  ONETOUCH VERIO test strip  01/25/14  Yes Historical Provider, MD  OXcarbazepine (TRILEPTAL) 150 MG tablet Take 150 mg by mouth at bedtime.    Yes Historical Provider, MD  pravastatin (PRAVACHOL) 40 MG tablet Take 40 mg by mouth daily.   Yes Historical Provider, MD  pregabalin (LYRICA) 75 MG capsule Take 75 mg by mouth every morning.    Yes Historical Provider, MD  promethazine (PHENERGAN) 25 MG suppository Place 1 suppository (25 mg total) rectally every 6 (six) hours as needed for nausea or vomiting. 01/06/14  Yes Chelle Jeffery, PA-C  sertraline (ZOLOFT) 25 MG tablet Take 25 mg by mouth daily.   Yes Historical Provider, MD  topiramate (TOPAMAX) 25 MG tablet Take 25 mg by mouth 2 (two) times daily.   Yes Historical Provider, MD  TRULICITY 1.5 0000000 SOPN INJECT 1.5 MG WEEKLY FOR DIABETES 09/22/14  Yes Historical  Provider, MD  doxycycline (VIBRAMYCIN) 100 MG capsule Take 1 capsule (100 mg total) by mouth 2 (two) times daily. 01/02/16 01/12/16  Chelle Jeffery, PA-C  levothyroxine (SYNTHROID) 50 MCG tablet Take 50 mcg by mouth daily before breakfast.    Historical Provider, MD   Patient Active Problem List   Diagnosis Date Noted  . Closed fracture of proximal end of left humerus with routine healing, subsequent encounter 12/15/2015  . GERD (gastroesophageal reflux disease) 02/15/2014  . IBS (irritable bowel syndrome) 02/15/2014  . Osteopenia 02/08/2014  . Ocular myasthenia gravis ([place]) 10/07/2013  . Vitamin D deficiency 08/27/2013  . Hypothyroidism 08/27/2013  . Migraine 08/14/2013  . Bipolar 2 disorder ([place]) 03/05/2011  . Diabetes mellitus ([place]) 03/05/2011  . Fatty  infiltration of liver 03/02/2011      Objective:   Physical Exam  Constitutional: She is oriented to person, place, and time. She appears well-developed and well-nourished. No distress.  HENT:  Head: Normocephalic and atraumatic.  Mouth/Throat: No oropharyngeal exudate.  Eyes: Conjunctivae are normal. Pupils are equal, round, and reactive to light. Right eye exhibits no discharge and no exudate. Left eye exhibits no discharge and no exudate. Right conjunctiva is not injected. Right conjunctiva has no hemorrhage. Left conjunctiva is not injected. Left conjunctiva has no hemorrhage. No scleral icterus. Right pupil is round and reactive. Left pupil is round and reactive. Pupils are equal.  Neck: Normal range of motion. Neck supple. No tracheal deviation present. No thyromegaly present.  Cardiovascular: Normal rate, regular rhythm, S1 normal, S2 normal, normal heart sounds and intact distal pulses.  Exam reveals no gallop, no distant heart sounds and no friction rub.   No murmur heard. Pulses:      Radial pulses are 2+ on the right side, and 2+ on the left side.  Pulmonary/Chest: Effort normal and breath sounds normal. No stridor. No respiratory distress. She has no decreased breath sounds. She has no wheezes. She has no rhonchi. She has no rales.  Musculoskeletal:       Right shoulder: She exhibits normal range of motion, no tenderness, no bony tenderness, no swelling, no effusion, no deformity, no pain, no spasm, normal pulse and normal strength.       Left shoulder: She exhibits decreased range of motion, tenderness, pain and decreased strength. She exhibits no bony tenderness, no swelling, no effusion, no deformity, no laceration and normal pulse.       Right elbow: She exhibits normal range of motion, no swelling, no effusion and no deformity. No tenderness found.       Left elbow: She exhibits normal range of motion, no swelling, no effusion and no deformity. No tenderness found.       Right  wrist: She exhibits normal range of motion, no tenderness, no bony tenderness, no swelling, no effusion and no deformity.       Left wrist: She exhibits normal range of motion, no tenderness, no bony tenderness, no swelling, no effusion, no deformity and no laceration.       Arms: Decreased ROM in left shoulder due to pain.   Lymphadenopathy:    She has no cervical adenopathy.  Neurological: She is alert and oriented to person, place, and time. No sensory deficit.  Reflex Scores:      Brachioradialis reflexes are 2+ on the right side and 2+ on the left side. Skin: Skin is warm and dry. She is not diaphoretic.     Psychiatric: She has a normal mood and affect. Her  behavior is normal.       Assessment & Plan:  1. Closed fracture of proximal end of left humerus with routine healing, subsequent encounter Healing well at this point. Continue physical therapy and follow-up with orthopedics. Cleared to return to work on 11/20 without limitations. - HYDROcodone-acetaminophen (NORCO) 5-325 MG tablet; Take 1 tablet by mouth every 6 (six) hours as needed for moderate pain.  Dispense: 16 tablet; Refill: 0  2. Ocular myasthenia gravis ([place]) Currently without symptoms. Cleared to return to work on 11/20 without limitations.  3. Type 2 diabetes mellitus without complication, with long-term current use of insulin (HCC) Currently well-controlled on medications. Continue to check blood sugar at home and continue routine follow-up's with Dr. Elyse Hsu.  4. Infected sebaceous cyst of skin Advised to use warm compresses on site for 15-20 minutes a few times a day as needed for pain and to allow drainage. Wound culture collected in clinic. Rx Doxycycline. - doxycycline (VIBRAMYCIN) 100 MG capsule; Take 1 capsule (100 mg total) by mouth 2 (two) times daily.  Dispense: 20 capsule; Refill: 0 - WOUND CULTURE

## 2016-01-02 NOTE — Progress Notes (Signed)
Patient ID: Michelle Torres, female    DOB: Aug 03, 1960, 55 y.o.   MRN: [id number]  PCP: Harrison Mons, PA-C  Chief Complaint  Patient presents with  . Follow-up    Subjective:    HPI Presents for evaluation of her ability to return to work.  She has been out of work for some time, initially due to dizziness and double vision caused by ocular myasthenia gravis. Then just as she was ready to return, she fell and broke the LEFT humerus. She has now progressed to the point that she is ready to return to her full duty.  She continues to have some pain with the extremes of ROM.  Numbness and tingling in the LEFT hand, but also significantly improved, and she can grasp with her baseline strength. PT causes soreness, and she's still needing hydrocodone at HS on the nights following PT sessions.  A1C 5.4% at endocrinology this month.    Review of Systems As above.    Patient Active Problem List   Diagnosis Date Noted  . Closed fracture of proximal end of left humerus with routine healing, subsequent encounter 12/15/2015  . GERD (gastroesophageal reflux disease) 02/15/2014  . IBS (irritable bowel syndrome) 02/15/2014  . Osteopenia 02/08/2014  . Ocular myasthenia gravis ([place]) 10/07/2013  . Vitamin D deficiency 08/27/2013  . Hypothyroidism 08/27/2013  . Migraine 08/14/2013  . Bipolar 2 disorder ([place]) 03/05/2011  . Diabetes mellitus ([place]) 03/05/2011  . Fatty infiltration of liver 03/02/2011     Prior to Admission medications   Medication Sig Start Date End Date Taking? Authorizing Provider  alosetron (LOTRONEX) 0.5 MG tablet Take 0.5 mg by mouth 2 (two) times daily.   Yes Historical Provider, MD  azaTHIOprine (IMURAN) 50 MG tablet Take 50 mg by mouth 3 (three) times daily.    Yes Historical Provider, MD  buPROPion (WELLBUTRIN XL) 150 MG 24 hr tablet Take 150 mg by mouth daily.   Yes Historical Provider, MD  calcium carbonate 1250 MG capsule Take 1,000 mg by mouth daily.   Yes  Historical Provider, MD  cetirizine (ZYRTEC) 10 MG tablet Take 10 mg by mouth daily.   Yes Historical Provider, MD  cholecalciferol (VITAMIN D) 1000 UNITS tablet Take 5,000 Units by mouth daily.   Yes Historical Provider, MD  clonazePAM (KLONOPIN) 1 MG tablet Take 4 mg by mouth at bedtime. May take 1/2 to 1 tablet during the day as needed   Yes Historical Provider, MD  divalproex (DEPAKOTE ER) 500 MG 24 hr tablet Take 1,500 mg by mouth at bedtime.    Yes Historical Provider, MD  eletriptan (RELPAX) 40 MG tablet Take 40 mg by mouth as needed for migraine. One tablet by mouth at onset of headache. May repeat in 2 hours if headache persists or recurs.   Yes Historical Provider, MD  fluticasone (FLONASE) 50 MCG/ACT nasal spray Place 2 sprays into the nose as needed for rhinitis.   Yes Historical Provider, MD  gabapentin (NEURONTIN) 600 MG tablet Take 600 mg by mouth at bedtime.     Yes Historical Provider, MD  HYDROcodone-acetaminophen (NORCO) 5-325 MG tablet Take 1 tablet by mouth every 6 (six) hours as needed for moderate pain. 11/02/15  Yes Darlyne Russian, MD  insulin degludec (TRESIBA) 100 UNIT/ML SOPN FlexTouch Pen Inject into the skin daily at 10 pm.   Yes Historical Provider, MD  Insulin Lispro (HUMALOG KWIKPEN) 200 UNIT/ML SOPN Inject into the skin.   Yes Historical Provider, MD  lamoTRIgine (LAMICTAL)  100 MG tablet Take 200 mg by mouth every morning.    Yes Historical Provider, MD  levothyroxine (SYNTHROID, LEVOTHROID) 75 MCG tablet Take 75 mcg by mouth daily before breakfast.   Yes Historical Provider, MD  Melatonin 10 MG CAPS Take by mouth.   Yes Historical Provider, MD  metFORMIN (GLUCOPHAGE-XR) 500 MG 24 hr tablet Take 1,000 mg by mouth 2 (two) times daily.   Yes Historical Provider, MD  naproxen (NAPROSYN) 500 MG tablet Take 500 mg by mouth 2 (two) times daily as needed (for pain).    Yes Historical Provider, MD  NEEDLE, DISP, 30 G 30G X 3/4" MISC Use as directed, daily blood sugars ( please  dispense what patient had previously) 07/14/13  Yes Thao P Le, DO  omeprazole (PRILOSEC) 40 MG capsule Take 1 capsule (40 mg total) by mouth daily. 02/08/14  Yes Arzella Rehmann, PA-C  ondansetron (ZOFRAN-ODT) 8 MG disintegrating tablet Take 0.5-1 tablets (4-8 mg total) by mouth every 8 (eight) hours as needed for nausea. 08/26/14  Yes Demar Shad, PA-C  ONETOUCH VERIO test strip  01/25/14  Yes Historical Provider, MD  OXcarbazepine (TRILEPTAL) 150 MG tablet Take 150 mg by mouth at bedtime.    Yes Historical Provider, MD  pravastatin (PRAVACHOL) 40 MG tablet Take 40 mg by mouth daily.   Yes Historical Provider, MD  pregabalin (LYRICA) 75 MG capsule Take 75 mg by mouth every morning.    Yes Historical Provider, MD  promethazine (PHENERGAN) 25 MG suppository Place 1 suppository (25 mg total) rectally every 6 (six) hours as needed for nausea or vomiting. 01/06/14  Yes Sache Sane, PA-C  sertraline (ZOLOFT) 25 MG tablet Take 25 mg by mouth daily.   Yes Historical Provider, MD  topiramate (TOPAMAX) 25 MG tablet Take 25 mg by mouth 2 (two) times daily.   Yes Historical Provider, MD  TRULICITY 1.5 0000000 SOPN INJECT 1.5 MG WEEKLY FOR DIABETES 09/22/14  Yes Historical Provider, MD  levothyroxine (SYNTHROID) 50 MCG tablet Take 50 mcg by mouth daily before breakfast.    Historical Provider, MD     Allergies  Allergen Reactions  . Mestinon [Pyridostigmine Bromide] Nausea And Vomiting    "violently ill"  . Risperidone And Related Other (See Comments)    liver damage       Objective:  Physical Exam  Constitutional: She is oriented to person, place, and time. She appears well-developed and well-nourished. She is active and cooperative. No distress.  BP 114/70   Pulse 90   Temp 98.3 F (36.8 C) (Oral)   Resp 18   Ht 5' (1.524 m)   Wt 192 lb (87.1 kg)   SpO2 95%   BMI 37.50 kg/m    Eyes: Conjunctivae are normal.  Pulmonary/Chest: Effort normal.  Musculoskeletal:       Left shoulder: She  exhibits normal range of motion (but with pain at extremes), no swelling, normal pulse and normal strength.       Arms: Neurological: She is alert and oriented to person, place, and time.  Skin: Skin is warm and dry. Lesion noted.     Psychiatric: She has a normal mood and affect. Her speech is normal and behavior is normal.           Assessment & Plan:   1. Closed fracture of proximal end of left humerus with routine healing, subsequent encounter Continue PT. RTW 11/20, no limitations. - HYDROcodone-acetaminophen (NORCO) 5-325 MG tablet; Take 1 tablet by mouth every 6 (six) hours  as needed for moderate pain.  Dispense: 16 tablet; Refill: 0  2. Ocular myasthenia gravis ([place]) Improved/stable. RTW 11/20, no limitations.  3. Type 2 diabetes mellitus without complication, with long-term current use of insulin (HCC) Continue follow-up per Dr. Elyse Hsu. Congratulated on her excellent control.  4. Infected sebaceous cyst of skin Warm compress. Doxycycline. Await Cx results. - doxycycline (VIBRAMYCIN) 100 MG capsule; Take 1 capsule (100 mg total) by mouth 2 (two) times daily.  Dispense: 20 capsule; Refill: 0 - WOUND CULTURE   Fara Chute, PA-C Physician Assistant-Certified [facility]

## 2016-01-02 NOTE — Patient Instructions (Signed)
     IF you received an x-ray today, you will receive an invoice from [facility]. Please contact [facility] at [phone] with questions or concerns regarding your invoice.   IF you received labwork today, you will receive an invoice from [facility]. Please contact [facility] at [phone] with questions or concerns regarding your invoice.   Our billing staff will not be able to assist you with questions regarding bills from these companies.  You will be contacted with the lab results as soon as they are available. The fastest way to get your results is to activate your My Chart account. Instructions are located on the last page of this paperwork. If you have not heard from us regarding the results in 2 weeks, please contact this office.      

## 2016-01-04 LAB — WOUND CULTURE
Gram Stain: NONE SEEN
Gram Stain: NONE SEEN
ORGANISM ID, BACTERIA: NO GROWTH

## 2016-01-26 ENCOUNTER — Encounter (INDEPENDENT_AMBULATORY_CARE_PROVIDER_SITE_OTHER): Payer: Self-pay | Admitting: Orthopaedic Surgery

## 2016-01-26 ENCOUNTER — Ambulatory Visit (INDEPENDENT_AMBULATORY_CARE_PROVIDER_SITE_OTHER): Payer: BC Managed Care – PPO

## 2016-01-26 ENCOUNTER — Ambulatory Visit (INDEPENDENT_AMBULATORY_CARE_PROVIDER_SITE_OTHER): Payer: BC Managed Care – PPO | Admitting: Orthopaedic Surgery

## 2016-01-26 DIAGNOSIS — S42202D Unspecified fracture of upper end of left humerus, subsequent encounter for fracture with routine healing: Secondary | ICD-10-CM

## 2016-01-26 NOTE — Progress Notes (Signed)
3 month f/u for left proximal humerus fx.  Doing well.  Progressing with PT.  ROM of left shoulder is improved greatly.  Can touch top of head. xrays show healed proximal humerus fx.  May continue with PT for 4 more weeks or just do HEP - up to her.  F/u prn.

## 2016-02-01 ENCOUNTER — Ambulatory Visit (INDEPENDENT_AMBULATORY_CARE_PROVIDER_SITE_OTHER): Payer: Self-pay | Admitting: Family Medicine

## 2016-02-01 VITALS — BP 118/78 | HR 85 | Temp 97.6°F | Resp 17 | Ht 60.0 in | Wt 193.0 lb

## 2016-02-01 DIAGNOSIS — G43809 Other migraine, not intractable, without status migrainosus: Secondary | ICD-10-CM | POA: Diagnosis not present

## 2016-02-01 MED ORDER — KETOROLAC TROMETHAMINE 60 MG/2ML IM SOLN
60.0000 mg | Freq: Once | INTRAMUSCULAR | Status: AC
  Administered 2016-02-01: 60 mg via INTRAMUSCULAR

## 2016-02-01 MED ORDER — PROMETHAZINE HCL 25 MG/ML IJ SOLN
25.0000 mg | Freq: Once | INTRAMUSCULAR | Status: AC
  Administered 2016-02-01: 25 mg via INTRAMUSCULAR

## 2016-02-01 NOTE — Progress Notes (Signed)
Michelle Torres is a 55 y.o. female who presents to [facility] today for migraine headache.  1.  Migraine  Symptoms started yesterday. Headache is consistent with her typical migraine headache. She has a throbbing headache that is distributed bilaterally behind her eyes. She was at work yesterday when the headache started, but she cannot think of any precipitating events. She went home and laid in a dark room and went to sleep, but the headache persisted. She tried taking Relpax and zofran, however her symptoms did not improve. She has had some nausea and vomiting with photophobia. No visual changes. No weakness or numbness. No fevers or chills.   ROS as above.   PMH reviewed. Patient is a nonsmoker.   Past Medical History:  Diagnosis Date  . Anxiety   . Bipolar disorder ([place])   . Depression   . Diabetes mellitus   . Fatty liver disease, nonalcoholic   . GERD (gastroesophageal reflux disease)   . High cholesterol   . Hypothyroidism    Past Surgical History:  Procedure Laterality Date  . DILATION AND CURETTAGE OF UTERUS    . ERCP  03/03/2011   Procedure: ENDOSCOPIC RETROGRADE CHOLANGIOPANCREATOGRAPHY (ERCP);  Surgeon: Lafayette Dragon, MD;  Location: [facility] ENDOSCOPY;  Service: Endoscopy;  Laterality: N/A;  . [place]    Medications reviewed. Current Outpatient Prescriptions  Medication Sig Dispense Refill  . alosetron (LOTRONEX) 0.5 MG tablet Take 0.5 mg by mouth 2 (two) times daily.    Marland Kitchen azaTHIOprine (IMURAN) 50 MG tablet Take 50 mg by mouth 3 (three) times daily.     Marland Kitchen buPROPion (WELLBUTRIN XL) 150 MG 24 hr tablet Take 150 mg by mouth daily.    . calcium carbonate 1250 MG capsule Take 1,000 mg by mouth daily.    . cetirizine (ZYRTEC) 10 MG tablet Take 10 mg by mouth daily.    . cholecalciferol (VITAMIN D) 1000 UNITS tablet Take 5,000 Units by mouth daily.    . clonazePAM (KLONOPIN) 1 MG tablet Take 4 mg by mouth at bedtime. May take 1/2  to 1 tablet during the day as needed    . divalproex (DEPAKOTE ER) 500 MG 24 hr tablet Take 1,500 mg by mouth at bedtime.     Marland Kitchen eletriptan (RELPAX) 40 MG tablet Take 40 mg by mouth as needed for migraine. One tablet by mouth at onset of headache. May repeat in 2 hours if headache persists or recurs.    . fluticasone (FLONASE) 50 MCG/ACT nasal spray Place 2 sprays into the nose as needed for rhinitis.    Marland Kitchen gabapentin (NEURONTIN) 600 MG tablet Take 600 mg by mouth at bedtime.      Marland Kitchen HYDROcodone-acetaminophen (NORCO) 5-325 MG tablet Take 1 tablet by mouth every 6 (six) hours as needed for moderate pain. 16 tablet 0  . insulin degludec (TRESIBA) 100 UNIT/ML SOPN FlexTouch Pen Inject into the skin daily at 10 pm.    . Insulin Lispro (HUMALOG KWIKPEN) 200 UNIT/ML SOPN Inject into the skin.    Marland Kitchen lamoTRIgine (LAMICTAL) 100 MG tablet Take 200 mg by mouth every morning.     Marland Kitchen levothyroxine (SYNTHROID, LEVOTHROID) 75 MCG tablet Take 75 mcg by mouth daily before breakfast.    . Melatonin 10 MG CAPS Take by mouth.    . metFORMIN (GLUCOPHAGE-XR) 500 MG 24 hr tablet Take 1,000 mg by mouth 2 (two) times daily.    . naproxen (NAPROSYN) 500 MG tablet Take 500 mg  by mouth 2 (two) times daily as needed (for pain).     Marland Kitchen NEEDLE, DISP, 30 G 30G X 3/4" MISC Use as directed, daily blood sugars ( please dispense what patient had previously) 100 each 3  . omeprazole (PRILOSEC) 40 MG capsule Take 1 capsule (40 mg total) by mouth daily. 30 capsule 3  . ondansetron (ZOFRAN-ODT) 8 MG disintegrating tablet Take 0.5-1 tablets (4-8 mg total) by mouth every 8 (eight) hours as needed for nausea. 90 tablet 0  . ONETOUCH VERIO test strip   12  . OXcarbazepine (TRILEPTAL) 150 MG tablet Take 150 mg by mouth at bedtime.     . pravastatin (PRAVACHOL) 40 MG tablet Take 40 mg by mouth daily.    . pregabalin (LYRICA) 75 MG capsule Take 75 mg by mouth every morning.     . promethazine (PHENERGAN) 25 MG suppository Place 1 suppository (25 mg  total) rectally every 6 (six) hours as needed for nausea or vomiting. 12 each 0  . sertraline (ZOLOFT) 25 MG tablet Take 25 mg by mouth daily.    Marland Kitchen topiramate (TOPAMAX) 25 MG tablet Take 25 mg by mouth 2 (two) times daily.    . TRULICITY 1.5 0000000 SOPN INJECT 1.5 MG WEEKLY FOR DIABETES  11   No current facility-administered medications for this visit.      Physical Exam:  BP 118/78 (BP Location: Left Arm, Patient Position: Sitting, Cuff Size: Large)   Pulse 85   Temp 97.6 F (36.4 C) (Oral)   Resp 17   Ht 5' (1.524 m)   Wt 193 lb (87.5 kg)   SpO2 96%   BMI 37.69 kg/m  Gen:  Alert, cooperative patient who appears stated age in no acute distress.  Vital signs reviewed. HEENT: EOMI,  MMM Pulm:  Clear to auscultation bilaterally with good air movement.  No wheezes or rales noted.   Cardiac:  Regular rate and rhythm without murmur auscultated.  Good S1/S2. Exts: Non edematous BL  LE, warm and well perfused.  Neuro: CN2-12 intact. Finger-nose-finger intact bilaterally. Strength 5/5 in all fields. Sensation to light touch intact.   Assessment and Plan:  1.  Migraine Symptoms consistent with typical migraine headaches. Will treat with toradol and phenergan in office. No red flag signs or symptoms. Normal neurologic exam. Return precautions reviewed. Follow up as needed.

## 2016-02-01 NOTE — Patient Instructions (Addendum)
  We will give you a shot of toradol and phenergan today.   If your symptoms worsen, or do not improve, please let us know.  Take care,  Dr Jerline Pain   Migraine Headache A migraine headache is a very strong throbbing pain on one side or both sides of your head. Migraines can also cause other symptoms. Talk with your doctor about what things may bring on (trigger) your migraine headaches. Follow these instructions at home: Medicines  Take over-the-counter and prescription medicines only as told by your doctor.  Do not drive or use heavy machinery while taking prescription pain medicine.  To prevent or treat constipation while you are taking prescription pain medicine, your doctor may recommend that you:  Drink enough fluid to keep your pee (urine) clear or pale yellow.  Take over-the-counter or prescription medicines.  Eat foods that are high in fiber. These include fresh fruits and vegetables, whole grains, and beans.  Limit foods that are high in fat and processed sugars. These include fried and sweet foods. Lifestyle  Avoid alcohol.  Do not use any products that contain nicotine or tobacco, such as cigarettes and e-cigarettes. If you need help quitting, ask your doctor.  Get at least 8 hours of sleep every night.  Limit your stress. General instructions  Keep a journal to find out what may bring on your migraines. For example, write down:  What you eat and drink.  How much sleep you get.  Any change in what you eat or drink.  Any change in your medicines.  If you have a migraine:  Avoid things that make your symptoms worse, such as bright lights.  It may help to lie down in a dark, quiet room.  Do not drive or use heavy machinery.  Ask your doctor what activities are safe for you.  Keep all follow-up visits as told by your doctor. This is important. Contact a doctor if:  You get a migraine that is different or worse than your usual migraines. Get help  right away if:  Your migraine gets very bad.  You have a fever.  You have a stiff neck.  You have trouble seeing.  Your muscles feel weak or like you cannot control them.  You start to lose your balance a lot.  You start to have trouble walking.  You pass out (faint). This information is not intended to replace advice given to you by your health care provider. Make sure you discuss any questions you have with your health care provider. Document Released: 11/15/2007 Document Revised: 08/26/2015 Document Reviewed: 07/25/2015 Elsevier Interactive Patient Education  2017 [employer].    IF you received an x-ray today, you will receive an invoice from [facility]. Please contact [facility] at [phone] with questions or concerns regarding your invoice.   IF you received labwork today, you will receive an invoice from [employer]. Please contact [facility] at [phone] with questions or concerns regarding your invoice.   Our billing staff will not be able to assist you with questions regarding bills from these companies.  You will be contacted with the lab results as soon as they are available. The fastest way to get your results is to activate your My Chart account. Instructions are located on the last page of this paperwork. If you have not heard from [place] regarding the results in 2 weeks, please contact this office.

## 2016-02-21 ENCOUNTER — Ambulatory Visit (INDEPENDENT_AMBULATORY_CARE_PROVIDER_SITE_OTHER): Payer: BC Managed Care – PPO | Admitting: Physician Assistant

## 2016-02-21 ENCOUNTER — Encounter: Payer: Self-pay | Admitting: Physician Assistant

## 2016-02-21 VITALS — BP 110/80 | HR 84 | Temp 97.5°F | Resp 16 | Ht 60.0 in | Wt 195.0 lb

## 2016-02-21 DIAGNOSIS — J029 Acute pharyngitis, unspecified: Secondary | ICD-10-CM | POA: Diagnosis not present

## 2016-02-21 DIAGNOSIS — R6889 Other general symptoms and signs: Secondary | ICD-10-CM | POA: Diagnosis not present

## 2016-02-21 LAB — POCT RAPID STREP A (OFFICE): RAPID STREP A SCREEN: NEGATIVE

## 2016-02-21 NOTE — Patient Instructions (Addendum)
  Your exam is normal aside from some mild redness in your throat. I am ruling out strep. Please drink lots of water and continue home remedies.  Come back if you don't continue to improve.    IF you received an x-ray today, you will receive an invoice from [facility]. Please contact [facility] at [phone] with questions or concerns regarding your invoice.   IF you received labwork today, you will receive an invoice from [place]. Please contact [facility] at [phone] with questions or concerns regarding your invoice.   Our billing staff will not be able to assist you with questions regarding bills from these companies.  You will be contacted with the lab results as soon as they are available. The fastest way to get your results is to activate your My Chart account. Instructions are located on the last page of this paperwork. If you have not heard from [place] regarding the results in 2 weeks, please contact this office.

## 2016-02-21 NOTE — Progress Notes (Signed)
02/21/2016 2:52 PM   DOB: 12-Jul-1960 / MRN: [id number]  SUBJECTIVE:  Michelle Torres is a 56 y.o. female presenting for 4 days ago. Complains mostly of nasal congestion, HA, and myalgia. She has had the flu shot.  She has a PMH of diabetes.  Husband reports a fever 103.  She does feel that she is getting better.  Tells me she has a history of diabetes and her fasting glucose was 96 today, and it is usually 70-80.   She is allergic to mestinon [pyridostigmine bromide] and risperidone and related.   She  has a past medical history of Anxiety; Bipolar disorder ([place]); Depression; Diabetes mellitus; Fatty liver disease, nonalcoholic; GERD (gastroesophageal reflux disease); High cholesterol; and Hypothyroidism.    She  reports that she has never smoked. She has never used smokeless tobacco. She reports that she drinks about 0.6 oz of alcohol per week . She reports that she does not use drugs. She  reports that she currently engages in sexual activity and has had female partners. She reports using the following method of birth control/protection: Post-menopausal. The patient  has a past surgical history that includes Laparoscopic cholecystectomy (1993); Dilation and curettage of uterus; and ERCP (03/03/2011).  Her family history includes Alcohol abuse in her father; Arthritis in her son; Breast cancer in her maternal grandmother and paternal grandmother; Cancer (age of onset: 63) in her mother; Depression in her son; Heart disease in her father; Mental illness in her father; Stroke in her father.  Review of Systems  Constitutional: Positive for chills, fever and malaise/fatigue.  HENT: Positive for congestion and sore throat. Negative for sinus pain.   Respiratory: Positive for cough. Negative for hemoptysis, sputum production, shortness of breath and wheezing.   Cardiovascular: Negative for chest pain.  Skin: Negative for rash.    The problem list and medications were reviewed and updated by myself where  necessary and exist elsewhere in the encounter.   OBJECTIVE:  BP 110/80 (BP Location: Left Arm, Patient Position: Sitting, Cuff Size: Normal)   Pulse 84   Temp 97.5 F (36.4 C) (Oral)   Resp 16   Ht 5' (1.524 m)   Wt 195 lb (88.5 kg)   SpO2 98%   BMI 38.08 kg/m   Physical Exam  Constitutional: She is oriented to person, place, and time.  HENT:  Right Ear: External ear normal.  Left Ear: External ear normal.  Nose: Mucosal edema present. Right sinus exhibits no maxillary sinus tenderness and no frontal sinus tenderness. Left sinus exhibits no maxillary sinus tenderness and no frontal sinus tenderness.  Mouth/Throat: Uvula is midline. Posterior oropharyngeal erythema present. No oropharyngeal exudate.  Eyes: Conjunctivae are normal. Pupils are equal, round, and reactive to light.  Cardiovascular: Normal rate, regular rhythm and normal heart sounds.   Pulmonary/Chest: Effort normal and breath sounds normal. She has no rales.  Neurological: She is alert and oriented to person, place, and time.  Skin: Skin is warm and dry. No rash noted. She is not diaphoretic. No erythema.  Psychiatric: Her behavior is normal.    Results for orders placed or performed in visit on 02/21/16 (from the past 72 hour(s))  POCT rapid strep A     Status: None   Collection Time: 02/21/16  2:34 PM  Result Value Ref Range   Rapid Strep A Screen Negative Negative    No results found.  ASSESSMENT AND PLAN  [name] was seen today for sinus problem and other.  Diagnoses and all  orders for this visit:  Influenza-like symptoms Comments: She tells me she is 50% better today.  Given fever of 103 last night and a normal exam today this was probably the flu.  Advised continued home remedies.   Sore throat Comments: Throat mildly erythematous however no pus.  Will rule out strep.  Orders: -     POCT rapid strep A -     Culture, Group A Strep    The patient is advised to call or return to clinic if she  does not see an improvement in symptoms, or to seek the care of the closest emergency department if she worsens with the above plan.   Philis Fendt, MHS, PA-C [facility] 02/21/2016 2:52 PM

## 2016-02-23 LAB — CULTURE, GROUP A STREP: Strep A Culture: NEGATIVE

## 2016-02-29 ENCOUNTER — Ambulatory Visit (INDEPENDENT_AMBULATORY_CARE_PROVIDER_SITE_OTHER): Payer: BC Managed Care – PPO | Admitting: Physician Assistant

## 2016-02-29 VITALS — BP 118/76 | HR 83 | Temp 97.5°F | Resp 18 | Ht 60.0 in | Wt 195.0 lb

## 2016-02-29 DIAGNOSIS — G43109 Migraine with aura, not intractable, without status migrainosus: Secondary | ICD-10-CM | POA: Diagnosis not present

## 2016-02-29 MED ORDER — KETOROLAC TROMETHAMINE 30 MG/ML IJ SOLN
30.0000 mg | Freq: Once | INTRAMUSCULAR | Status: AC
  Administered 2016-02-29: 30 mg via INTRAMUSCULAR

## 2016-02-29 NOTE — Progress Notes (Signed)
[facility] [address] [place] [address] [phone]  Date:  02/29/2016   Name:  Michelle Torres   DOB:  Mar 21, 1960   MRN:  [id number]  PCP:  Harrison Mons, PA-C    History of Present Illness:  Michelle Torres is a 56 y.o. female patient who presents to [facility] for cc of head pain.   Started with mild headache yesterday.  The left side of head and neck pain.  She took the Relpax, and Zofran.  She has nausea that started this morning, along with head pain returning early AM.  Rated pain 10/10.  She started to develop auras.  She is having dizziness.  She acknowledges that increased "mental activity" will trigger her migraines.  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  No numbness or tingling.  The pain has improved rating 8/10.  She is currently taking the Topamax compliantly.    Patient Active Problem List   Diagnosis Date Noted  . Closed fracture of proximal end of left humerus with routine healing, subsequent encounter 12/15/2015  . GERD (gastroesophageal reflux disease) 02/15/2014  . IBS (irritable bowel syndrome) 02/15/2014  . Osteopenia 02/08/2014  . 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 Vitamin D deficiency 08/27/2013  . Hypothyroidism 08/27/2013  . Migraine 08/14/2013  . Bipolar 2 disorder ([place]) 03/05/2011  . Diabetes mellitus ([place]) 03/05/2011  . Fatty infiltration of liver 03/02/2011    Past Medical History:  Diagnosis Date  . Anxiety   . Bipolar disorder ([place])   . Depression   . Diabetes mellitus   . Fatty liver disease, nonalcoholic   . GERD (gastroesophageal reflux disease)   . High cholesterol   . Hypothyroidism     Past Surgical History:  Procedure Laterality Date  . DILATION AND CURETTAGE OF UTERUS    . ERCP  03/03/2011   Procedure: ENDOSCOPIC RETROGRADE CHOLANGIOPANCREATOGRAPHY (ERCP);  Surgeon: Lafayette Dragon, MD;  Location: [facility] ENDOSCOPY;  Service: Endoscopy;  Laterality: N/A;   . [place]    Social History  Substance Use Topics  . Smoking status: Never Smoker  . Smokeless tobacco: Never Used  . Alcohol use 0.6 oz/week    1 Standard drinks or equivalent per week     Comment: once every couple months    Family History  Problem Relation Age of Onset  . Mental illness Father     Manic Depression  . Alcohol abuse Father   . Stroke Father   . Heart disease Father   . Arthritis Son   . Depression Son   . Cancer Mother 73    peritoneal  . Breast cancer Maternal Grandmother   . Breast cancer Paternal Grandmother     Allergies  Allergen Reactions  . Mestinon [Pyridostigmine Bromide] Nausea And Vomiting    "violently ill"  . Risperidone And Related Other (See Comments)    liver damage    Medication list has been reviewed and updated.  Current Outpatient Prescriptions on File Prior to Visit  Medication Sig Dispense Refill  . alosetron (LOTRONEX) 0.5 MG tablet Take 0.5 mg by mouth 2 (two) times daily.    Marland Kitchen azaTHIOprine (IMURAN) 50 MG tablet Take 50 mg by mouth 3 (three) times daily.     Marland Kitchen buPROPion (WELLBUTRIN XL) 150 MG 24 hr tablet Take 150 mg by mouth daily.    . calcium carbonate 1250 MG capsule Take 1,000  mg by mouth daily.    . cetirizine (ZYRTEC) 10 MG tablet Take 10 mg by mouth daily.    . cholecalciferol (VITAMIN D) 1000 UNITS tablet Take 5,000 Units by mouth daily.    . clonazePAM (KLONOPIN) 1 MG tablet Take 4 mg by mouth at bedtime. May take 1/2 to 1 tablet during the day as needed    . divalproex (DEPAKOTE ER) 500 MG 24 hr tablet Take 1,500 mg by mouth at bedtime.     Marland Kitchen eletriptan (RELPAX) 40 MG tablet Take 40 mg by mouth as needed for migraine. One tablet by mouth at onset of headache. May repeat in 2 hours if headache persists or recurs.    . fluticasone (FLONASE) 50 MCG/ACT nasal spray Place 2 sprays into the nose as needed for rhinitis.    Marland Kitchen gabapentin (NEURONTIN) 600 MG tablet Take 600 mg by mouth at bedtime.       . insulin degludec (TRESIBA) 100 UNIT/ML SOPN FlexTouch Pen Inject into the skin daily at 10 pm.    . Insulin Lispro (HUMALOG KWIKPEN) 200 UNIT/ML SOPN Inject into the skin.    Marland Kitchen lamoTRIgine (LAMICTAL) 100 MG tablet Take 200 mg by mouth every morning.     Marland Kitchen levothyroxine (SYNTHROID, LEVOTHROID) 75 MCG tablet Take 75 mcg by mouth daily before breakfast.    . Melatonin 10 MG CAPS Take by mouth.    . metFORMIN (GLUCOPHAGE-XR) 500 MG 24 hr tablet Take 1,000 mg by mouth 2 (two) times daily.    . naproxen (NAPROSYN) 500 MG tablet Take 500 mg by mouth 2 (two) times daily as needed (for pain).     Marland Kitchen NEEDLE, DISP, 30 G 30G X 3/4" MISC Use as directed, daily blood sugars ( please dispense what patient had previously) 100 each 3  . omeprazole (PRILOSEC) 40 MG capsule Take 1 capsule (40 mg total) by mouth daily. 30 capsule 3  . ondansetron (ZOFRAN-ODT) 8 MG disintegrating tablet Take 0.5-1 tablets (4-8 mg total) by mouth every 8 (eight) hours as needed for nausea. 90 tablet 0  . ONETOUCH VERIO test strip   12  . OXcarbazepine (TRILEPTAL) 150 MG tablet Take 150 mg by mouth at bedtime.     . pravastatin (PRAVACHOL) 40 MG tablet Take 40 mg by mouth daily.    . pregabalin (LYRICA) 75 MG capsule Take 75 mg by mouth every morning.     . promethazine (PHENERGAN) 25 MG suppository Place 1 suppository (25 mg total) rectally every 6 (six) hours as needed for nausea or vomiting. 12 each 0  . sertraline (ZOLOFT) 25 MG tablet Take 25 mg by mouth daily.    Marland Kitchen topiramate (TOPAMAX) 25 MG tablet Take 25 mg by mouth 2 (two) times daily.    . TRULICITY 1.5 0000000 SOPN INJECT 1.5 MG WEEKLY FOR DIABETES  11   No current facility-administered medications on file prior to visit.     ROS ROS otherwise unremarkable unless listed above  Physical Examination: BP 118/76 (BP Location: Right Arm, Patient Position: Sitting, Cuff Size: Normal)   Pulse 83   Temp 97.5 F (36.4 C) (Oral)   Resp 18   Ht 5' (1.524 m)   Wt 195  lb (88.5 kg)   SpO2 100%   BMI 38.08 kg/m  Ideal Body Weight: Weight in (lb) to have BMI = 25: 127.7  Physical Exam  Constitutional: She is oriented to person, place, and time. She appears well-developed and well-nourished. No distress.  HENT:  Head:  Normocephalic and atraumatic.  Right Ear: External ear normal.  Left Ear: External ear normal.  Eyes: Conjunctivae and EOM are normal. Pupils are equal, round, and reactive to light.  Cardiovascular: Normal rate and regular rhythm.  Exam reveals no friction rub.   No murmur heard. Pulmonary/Chest: Effort normal and breath sounds normal. No respiratory distress. She has no wheezes.  Neurological: She is alert and oriented to person, place, and time. She is not disoriented. No cranial nerve deficit. Coordination normal.  f to n  Skin: She is not diaphoretic.  Psychiatric: She has a normal mood and affect. Her behavior is normal.     Assessment and Plan: Michelle Torres is a 56 y.o. female who is here today for cc of migraine. Appears to be her typical migraine.  Given Toradol 30 mg today.   Advised that she should return if symptoms worsen. She will use the phenergan suppository if she is unable to tolerate fluids. Migraine with aura and without status migrainosus, not intractable - Plan: ketorolac (TORADOL) 30 MG/ML injection 30 mg  Ivar Drape, PA-C [facility] 1/11/20188:42 AM

## 2016-02-29 NOTE — Patient Instructions (Addendum)
  Please hydrate well with 64 oz of water if not more   IF you received an x-ray today, you will receive an invoice from [facility]. Please contact [facility] at [phone] with questions or concerns regarding your invoice.   IF you received labwork today, you will receive an invoice from [place]. Please contact [facility] at [phone] with questions or concerns regarding your invoice.   Our billing staff will not be able to assist you with questions regarding bills from these companies.  You will be contacted with the lab results as soon as they are available. The fastest way to get your results is to activate your My Chart account. Instructions are located on the last page of this paperwork. If you have not heard from [place] regarding the results in 2 weeks, please contact this office.

## 2016-03-27 ENCOUNTER — Encounter: Payer: Self-pay | Admitting: Physician Assistant

## 2016-03-27 ENCOUNTER — Ambulatory Visit (INDEPENDENT_AMBULATORY_CARE_PROVIDER_SITE_OTHER): Payer: BC Managed Care – PPO | Admitting: Physician Assistant

## 2016-03-27 VITALS — BP 121/75 | HR 91 | Temp 98.3°F | Ht 60.0 in | Wt 200.0 lb

## 2016-03-27 DIAGNOSIS — G43109 Migraine with aura, not intractable, without status migrainosus: Secondary | ICD-10-CM | POA: Diagnosis not present

## 2016-03-27 DIAGNOSIS — E119 Type 2 diabetes mellitus without complications: Secondary | ICD-10-CM | POA: Diagnosis not present

## 2016-03-27 DIAGNOSIS — Z794 Long term (current) use of insulin: Secondary | ICD-10-CM | POA: Diagnosis not present

## 2016-03-27 MED ORDER — KETOROLAC TROMETHAMINE 30 MG/ML IJ SOLN
30.0000 mg | Freq: Once | INTRAMUSCULAR | Status: AC
  Administered 2016-03-27: 30 mg via INTRAMUSCULAR

## 2016-03-27 MED ORDER — TOPIRAMATE 50 MG PO TABS: 50.0000 mg | ORAL_TABLET | Freq: Two times a day (BID) | ORAL | 3 refills | Status: DC

## 2016-03-27 MED ORDER — ELETRIPTAN HYDROBROMIDE 40 MG PO TABS: 40.0000 mg | ORAL_TABLET | ORAL | 3 refills | Status: DC | PRN

## 2016-03-27 MED ORDER — PROMETHAZINE HCL 25 MG PO TABS: 25.0000 mg | ORAL_TABLET | Freq: Three times a day (TID) | ORAL | 0 refills | Status: DC | PRN

## 2016-03-27 NOTE — Progress Notes (Signed)
Patient ID: Michelle Torres, female    DOB: Oct 03, 1960, 56 y.o.   MRN: [id number]  PCP: Harrison Mons, PA-C  Chief Complaint  Patient presents with  . Headache    X 1 a week  . Migraine    X 1-2 a mth    Subjective:   Presents for evaluation of migraine.  Pt is a 56 yo caucasian female who presents with migraine x 2 days. She states that she's been having 1 headache a week and 1-2 migraines per month. Pt states that a migraine started yesterday around 5 am, but was relieved with Relpax and Zofran. She was able to go to work and then went to bed early last night. She woke up this AM with a more intense left-sided migraine associated with visual disturbances, photophobia, nausea, and vomiting. She has tried a nap, Relpax, and Zofran with mild relief. Her pain is now a 6/10, down form a 8/10 this AM. Pt requests an injection of Toradol because it has helped in the past. This is not the worst headache of her life.  Review of Systems In addition to that stated in HPI above: Const: Denies fever, chills,fatigue, or weight changes. HEENT:  Pulm: Denies cough or SOB. CV: Denies chest pain or palpitations. Abd: Denies abdominal pain, diarrhea, or constipation.   Patient Active Problem List   Diagnosis Date Noted  . Closed fracture of proximal end of left humerus with routine healing, subsequent encounter 12/15/2015  . GERD (gastroesophageal reflux disease) 02/15/2014  . IBS (irritable bowel syndrome) 02/15/2014  . Osteopenia 02/08/2014  . Ocular myasthenia gravis ([place]) 10/07/2013  . Vitamin D deficiency 08/27/2013  . Hypothyroidism 08/27/2013  . Migraine 08/14/2013  . Bipolar 2 disorder ([place]) 03/05/2011  . Diabetes mellitus ([place]) 03/05/2011  . Fatty infiltration of liver 03/02/2011     Prior to Admission medications   Medication Sig Start Date End Date Taking? Authorizing Provider  alosetron (LOTRONEX) 0.5 MG tablet Take 0.5 mg by mouth 2 (two) times daily.   Yes Historical  Provider, MD  azaTHIOprine (IMURAN) 50 MG tablet Take 50 mg by mouth 3 (three) times daily.    Yes Historical Provider, MD  buPROPion (WELLBUTRIN XL) 150 MG 24 hr tablet Take 150 mg by mouth daily.   Yes Historical Provider, MD  calcium carbonate 1250 MG capsule Take 1,000 mg by mouth daily.   Yes Historical Provider, MD  cetirizine (ZYRTEC) 10 MG tablet Take 10 mg by mouth daily.   Yes Historical Provider, MD  cholecalciferol (VITAMIN D) 1000 UNITS tablet Take 5,000 Units by mouth daily.   Yes Historical Provider, MD  clonazePAM (KLONOPIN) 1 MG tablet Take 4 mg by mouth at bedtime. May take 1/2 to 1 tablet during the day as needed   Yes Historical Provider, MD  divalproex (DEPAKOTE ER) 500 MG 24 hr tablet Take 1,500 mg by mouth at bedtime.    Yes Historical Provider, MD  eletriptan (RELPAX) 40 MG tablet Take 40 mg by mouth as needed for migraine. One tablet by mouth at onset of headache. May repeat in 2 hours if headache persists or recurs.   Yes Historical Provider, MD  fluticasone (FLONASE) 50 MCG/ACT nasal spray Place 2 sprays into the nose as needed for rhinitis.   Yes Historical Provider, MD  gabapentin (NEURONTIN) 600 MG tablet Take 600 mg by mouth at bedtime.     Yes Historical Provider, MD  insulin degludec (TRESIBA) 100 UNIT/ML SOPN FlexTouch Pen Inject into the  skin daily at 10 pm.   Yes Historical Provider, MD  Insulin Lispro (HUMALOG KWIKPEN) 200 UNIT/ML SOPN Inject into the skin.   Yes Historical Provider, MD  lamoTRIgine (LAMICTAL) 100 MG tablet Take 200 mg by mouth every morning.    Yes Historical Provider, MD  levothyroxine (SYNTHROID, LEVOTHROID) 75 MCG tablet Take 75 mcg by mouth daily before breakfast.   Yes Historical Provider, MD  Melatonin 10 MG CAPS Take by mouth.   Yes Historical Provider, MD  metFORMIN (GLUCOPHAGE-XR) 500 MG 24 hr tablet Take 1,000 mg by mouth 2 (two) times daily.   Yes Historical Provider, MD  naproxen (NAPROSYN) 500 MG tablet Take 500 mg by mouth 2 (two)  times daily as needed (for pain).    Yes Historical Provider, MD  NEEDLE, DISP, 30 G 30G X 3/4" MISC Use as directed, daily blood sugars ( please dispense what patient had previously) 07/14/13  Yes Thao P Le, DO  omeprazole (PRILOSEC) 40 MG capsule Take 1 capsule (40 mg total) by mouth daily. 02/08/14  Yes Chelle Jeffery, PA-C  ondansetron (ZOFRAN-ODT) 8 MG disintegrating tablet Take 0.5-1 tablets (4-8 mg total) by mouth every 8 (eight) hours as needed for nausea. 08/26/14  Yes Chelle Jeffery, PA-C  ONETOUCH VERIO test strip  01/25/14  Yes Historical Provider, MD  OXcarbazepine (TRILEPTAL) 150 MG tablet Take 150 mg by mouth at bedtime.    Yes Historical Provider, MD  pravastatin (PRAVACHOL) 40 MG tablet Take 40 mg by mouth daily.   Yes Historical Provider, MD  pregabalin (LYRICA) 75 MG capsule Take 75 mg by mouth every morning.    Yes Historical Provider, MD  promethazine (PHENERGAN) 25 MG suppository Place 1 suppository (25 mg total) rectally every 6 (six) hours as needed for nausea or vomiting. 01/06/14  Yes Chelle Jeffery, PA-C  sertraline (ZOLOFT) 25 MG tablet Take 25 mg by mouth daily.   Yes Historical Provider, MD  topiramate (TOPAMAX) 25 MG tablet Take 25 mg by mouth 2 (two) times daily.   Yes Historical Provider, MD  TRULICITY 1.5 0000000 SOPN INJECT 1.5 MG WEEKLY FOR DIABETES 09/22/14  Yes Historical Provider, MD     Allergies  Allergen Reactions  . Mestinon [Pyridostigmine Bromide] Nausea And Vomiting    "violently ill"  . Risperidone And Related Other (See Comments)    liver damage       Objective:  Physical Exam HEENT: PERRLA. Pulm: Good respiratory effort. CTAB. No wheezes, rales, or rhonchi. CV: RRR. No M/R/G.  Neuro: A&O x 3. DTRs intact at brachioradialis, patellar, and achilles. Negative Kernig.  MSK: 5/5 strength in hands bilaterally. No tenderness to palpation of neck, Full ROM at neck. Skin: Mild tenderness to palpation of left temporal region into scalp.       Assessment & Plan:   1. Migraine with aura and without status migrainosus, not intractable Symptoms are not controlled on maintenance medication. Will increase Topiramate to 50 mg BID. Will give Tordadol injection for acute relief of symptoms. Pt advised to go home and rest. - promethazine (PHENERGAN) 25 MG tablet; Take 1 tablet (25 mg total) by mouth every 8 (eight) hours as needed for nausea or vomiting.  Dispense: 20 tablet; Refill: 0 - ketorolac (TORADOL) 30 MG/ML injection 30 mg; Inject 1 mL (30 mg total) into the muscle once. - topiramate (TOPAMAX) 50 MG tablet; Take 1 tablet (50 mg total) by mouth 2 (two) times daily.  Dispense: 180 tablet; Refill: 3 - eletriptan (RELPAX) 40 MG tablet; Take  1 tablet (40 mg total) by mouth as needed for migraine. One tablet by mouth at onset of headache. May repeat in 2 hours if headache persists or recurs.  Dispense: 10 tablet; Refill: 3 - Care order/instruction:  2. Type 2 diabetes mellitus without complication, with long-term current use of insulin [facility]) Endocrinology follows and manages.  - HM Diabetes Foot Exam  Lorella Nimrod, PA-S

## 2016-03-27 NOTE — Progress Notes (Signed)
Patient ID: Michelle Torres, female    DOB: October 27, 1960, 56 y.o.   MRN: [id number]  PCP: Michelle Mons, PA-C  Chief Complaint  Patient presents with  . Headache    X 1 a week  . Migraine    X 1-2 a mth    Subjective:   Presents for evaluation of headache. She is accompanied by her husband, Michelle Torres.  This headache started yesterday at 5 am. She has known migraine, but has noticed an increase in headache frequency. Reports 1-2 migraine/month, and 1 other headache each week. When this headache started, she took Relpax and zofran and initially had some relief. She worked and went to bed early last night.  Upon waking this morning, the headache was more intense (8/10), located on the LEFT side and associated with her typical photophobia, nausea and vomiting. Relpax, Zofran and a nap have reduced her pain to 6/10. No atypical features of this headache for her. Not the worst of her life.  She presents requesting abortive treatment that has worked for her in the past.    Review of Systems As above.    Patient Active Problem List   Diagnosis Date Noted  . Closed fracture of proximal end of left humerus with routine healing, subsequent encounter 12/15/2015  . GERD (gastroesophageal reflux disease) 02/15/2014  . IBS (irritable bowel syndrome) 02/15/2014  . Osteopenia 02/08/2014  . Ocular myasthenia gravis ([place]) 10/07/2013  . Vitamin D deficiency 08/27/2013  . Hypothyroidism 08/27/2013  . Migraine 08/14/2013  . Bipolar 2 disorder ([place]) 03/05/2011  . Diabetes mellitus ([place]) 03/05/2011  . Fatty infiltration of liver 03/02/2011     Prior to Admission medications   Medication Sig Start Date End Date Taking? Authorizing Provider  alosetron (LOTRONEX) 0.5 MG tablet Take 0.5 mg by mouth 2 (two) times daily.   Yes Historical Provider, MD  azaTHIOprine (IMURAN) 50 MG tablet Take 50 mg by mouth 3 (three) times daily.    Yes Historical Provider, MD  buPROPion (WELLBUTRIN XL) 150 MG 24 hr  tablet Take 150 mg by mouth daily.   Yes Historical Provider, MD  calcium carbonate 1250 MG capsule Take 1,000 mg by mouth daily.   Yes Historical Provider, MD  cetirizine (ZYRTEC) 10 MG tablet Take 10 mg by mouth daily.   Yes Historical Provider, MD  cholecalciferol (VITAMIN D) 1000 UNITS tablet Take 5,000 Units by mouth daily.   Yes Historical Provider, MD  clonazePAM (KLONOPIN) 1 MG tablet Take 4 mg by mouth at bedtime. May take 1/2 to 1 tablet during the day as needed   Yes Historical Provider, MD  divalproex (DEPAKOTE ER) 500 MG 24 hr tablet Take 1,500 mg by mouth at bedtime.    Yes Historical Provider, MD  eletriptan (RELPAX) 40 MG tablet Take 40 mg by mouth as needed for migraine. One tablet by mouth at onset of headache. May repeat in 2 hours if headache persists or recurs.   Yes Historical Provider, MD  fluticasone (FLONASE) 50 MCG/ACT nasal spray Place 2 sprays into the nose as needed for rhinitis.   Yes Historical Provider, MD  gabapentin (NEURONTIN) 600 MG tablet Take 600 mg by mouth at bedtime.     Yes Historical Provider, MD  insulin degludec (TRESIBA) 100 UNIT/ML SOPN FlexTouch Pen Inject into the skin daily at 10 pm.   Yes Historical Provider, MD  Insulin Lispro (HUMALOG KWIKPEN) 200 UNIT/ML SOPN Inject into the skin.   Yes Historical Provider, MD  lamoTRIgine (LAMICTAL) 100  MG tablet Take 200 mg by mouth every morning.    Yes Historical Provider, MD  levothyroxine (SYNTHROID, LEVOTHROID) 75 MCG tablet Take 75 mcg by mouth daily before breakfast.   Yes Historical Provider, MD  Melatonin 10 MG CAPS Take by mouth.   Yes Historical Provider, MD  metFORMIN (GLUCOPHAGE-XR) 500 MG 24 hr tablet Take 1,000 mg by mouth 2 (two) times daily.   Yes Historical Provider, MD  naproxen (NAPROSYN) 500 MG tablet Take 500 mg by mouth 2 (two) times daily as needed (for pain).    Yes Historical Provider, MD  NEEDLE, DISP, 30 G 30G X 3/4" MISC Use as directed, daily blood sugars ( please dispense what  patient had previously) 07/14/13  Yes Thao P Le, DO  omeprazole (PRILOSEC) 40 MG capsule Take 1 capsule (40 mg total) by mouth daily. 02/08/14  Yes Jahdai Padovano, PA-C  ondansetron (ZOFRAN-ODT) 8 MG disintegrating tablet Take 0.5-1 tablets (4-8 mg total) by mouth every 8 (eight) hours as needed for nausea. 08/26/14  Yes Bertran Zeimet, PA-C  ONETOUCH VERIO test strip  01/25/14  Yes Historical Provider, MD  OXcarbazepine (TRILEPTAL) 150 MG tablet Take 150 mg by mouth at bedtime.    Yes Historical Provider, MD  pravastatin (PRAVACHOL) 40 MG tablet Take 40 mg by mouth daily.   Yes Historical Provider, MD  pregabalin (LYRICA) 75 MG capsule Take 75 mg by mouth every morning.    Yes Historical Provider, MD  promethazine (PHENERGAN) 25 MG suppository Place 1 suppository (25 mg total) rectally every 6 (six) hours as needed for nausea or vomiting. 01/06/14  Yes Biruk Troia, PA-C  sertraline (ZOLOFT) 25 MG tablet Take 25 mg by mouth daily.   Yes Historical Provider, MD  topiramate (TOPAMAX) 25 MG tablet Take 25 mg by mouth 2 (two) times daily.   Yes Historical Provider, MD  TRULICITY 1.5 0000000 SOPN INJECT 1.5 MG WEEKLY FOR DIABETES 09/22/14  Yes Historical Provider, MD     Allergies  Allergen Reactions  . Mestinon [Pyridostigmine Bromide] Nausea And Vomiting    "violently ill"  . Risperidone And Related Other (See Comments)    liver damage       Objective:  Physical Exam  Constitutional: She is oriented to person, place, and time. She appears well-developed and well-nourished. She is active and cooperative. No distress.  BP 121/75   Pulse 91   Temp 98.3 F (36.8 C) (Oral)   Ht 5' (1.524 m)   Wt 200 lb (90.7 kg)   SpO2 95%   BMI 39.06 kg/m   HENT:  Head: Normocephalic and atraumatic.  Right Ear: Hearing normal.  Left Ear: Hearing normal.  Eyes: Conjunctivae are normal. No scleral icterus.  Neck: Normal range of motion. Neck supple. No thyromegaly present.  Cardiovascular: Normal rate,  regular rhythm and normal heart sounds.   Pulses:      Radial pulses are 2+ on the right side, and 2+ on the left side.  Pulmonary/Chest: Effort normal and breath sounds normal.  Lymphadenopathy:       Head (right side): No tonsillar, no preauricular, no posterior auricular and no occipital adenopathy present.       Head (left side): No tonsillar, no preauricular, no posterior auricular and no occipital adenopathy present.    She has no cervical adenopathy.       Right: No supraclavicular adenopathy present.       Left: No supraclavicular adenopathy present.  Neurological: She is alert and oriented to person, place,  and time. She has normal strength. No cranial nerve deficit or sensory deficit. Coordination and gait normal.  Reflex Scores:      Bicep reflexes are 2+ on the right side and 2+ on the left side.      Patellar reflexes are 2+ on the right side and 2+ on the left side.      Achilles reflexes are 2+ on the right side and 2+ on the left side. Skin: Skin is warm, dry and intact. No rash noted. No cyanosis or erythema. Nails show no clubbing.  Psychiatric: She has a normal mood and affect. Her speech is normal and behavior is normal.           Assessment & Plan:   1. Migraine with aura and without status migrainosus, not intractable Promethazine IM and Toradol IM. INCREASE topiramate to 50 mg BID and PRN Relpax and promethazine. - promethazine (PHENERGAN) 25 MG tablet; Take 1 tablet (25 mg total) by mouth every 8 (eight) hours as needed for nausea or vomiting.  Dispense: 20 tablet; Refill: 0 - ketorolac (TORADOL) 30 MG/ML injection 30 mg; Inject 1 mL (30 mg total) into the muscle once. - topiramate (TOPAMAX) 50 MG tablet; Take 1 tablet (50 mg total) by mouth 2 (two) times daily.  Dispense: 180 tablet; Refill: 3 - eletriptan (RELPAX) 40 MG tablet; Take 1 tablet (40 mg total) by mouth as needed for migraine. One tablet by mouth at onset of headache. May repeat in 2 hours if  headache persists or recurs.  Dispense: 10 tablet; Refill: 3 - Care order/instruction:  2. Type 2 diabetes mellitus without complication, with long-term current use of insulin ([place]) Managed by endocrinology. - HM Diabetes Foot Exam   Fara Chute, PA-C Physician Assistant-Certified [facility] [place]

## 2016-03-27 NOTE — Patient Instructions (Signed)
     IF you received an x-ray today, you will receive an invoice from [facility]. Please contact [facility] at [phone] with questions or concerns regarding your invoice.   IF you received labwork today, you will receive an invoice from [facility]. Please contact [facility] at [phone] with questions or concerns regarding your invoice.   Our billing staff will not be able to assist you with questions regarding bills from these companies.  You will be contacted with the lab results as soon as they are available. The fastest way to get your results is to activate your My Chart account. Instructions are located on the last page of this paperwork. If you have not heard from us regarding the results in 2 weeks, please contact this office.     

## 2016-05-03 ENCOUNTER — Telehealth (INDEPENDENT_AMBULATORY_CARE_PROVIDER_SITE_OTHER): Payer: Self-pay | Admitting: Orthopaedic Surgery

## 2016-05-03 NOTE — Telephone Encounter (Signed)
Patient states she has been in physical therapy since her visit with Dr Erlinda Hong for her left shoulder, she states she was doing great until last week and she's having severe left should, left low back and neck pain. Pt request a call to discuss other options. Patient states the we can speak with her husband if she's not available.

## 2016-05-03 NOTE — Telephone Encounter (Signed)
Please advise. See message below.  

## 2016-05-06 NOTE — Telephone Encounter (Signed)
Called patient and left a voicemail for her to return our call

## 2016-05-09 NOTE — Telephone Encounter (Signed)
Called again no answer LMOM .Marland Kitchen If she calls back please ask her what is going on?!

## 2016-05-23 ENCOUNTER — Ambulatory Visit (INDEPENDENT_AMBULATORY_CARE_PROVIDER_SITE_OTHER): Payer: Self-pay

## 2016-05-23 ENCOUNTER — Ambulatory Visit (INDEPENDENT_AMBULATORY_CARE_PROVIDER_SITE_OTHER): Payer: BC Managed Care – PPO | Admitting: Family

## 2016-05-23 ENCOUNTER — Encounter (INDEPENDENT_AMBULATORY_CARE_PROVIDER_SITE_OTHER): Payer: Self-pay | Admitting: Family

## 2016-05-23 VITALS — Ht 60.0 in | Wt 200.0 lb

## 2016-05-23 DIAGNOSIS — M25512 Pain in left shoulder: Secondary | ICD-10-CM | POA: Diagnosis not present

## 2016-05-23 DIAGNOSIS — S46812A Strain of other muscles, fascia and tendons at shoulder and upper arm level, left arm, initial encounter: Secondary | ICD-10-CM | POA: Diagnosis not present

## 2016-05-23 DIAGNOSIS — S42202D Unspecified fracture of upper end of left humerus, subsequent encounter for fracture with routine healing: Secondary | ICD-10-CM | POA: Diagnosis not present

## 2016-05-23 DIAGNOSIS — G8929 Other chronic pain: Secondary | ICD-10-CM | POA: Diagnosis not present

## 2016-05-23 DIAGNOSIS — M542 Cervicalgia: Secondary | ICD-10-CM | POA: Diagnosis not present

## 2016-05-23 NOTE — Progress Notes (Signed)
Office Visit Note   Patient: Michelle Torres           Date of Birth: September 08, 1960           MRN: [id number] Visit Date: 05/23/2016              Requested by: Harrison Mons, PA-C [address] [place], [place] [address] PCP: Harrison Mons, PA-C  Chief Complaint  Patient presents with  . Left Shoulder - Pain      HPI: The patient is a 56 year old woman who presents today for evaluation of left shoulder pain as well as left-sided neck pain. She is status post a proximal humerus fracture on September 2 last year. Dr. Sherrian Divers followed her for this. She was released back in November of left shoe. Has been doing physical therapy since for shoulder pain. Mainly has been working on range of motion. The patient does state they have been doing dry needling and both her left trapezius as well as left bicep. Also states using a TENS unit.  Patient feel she is now regressing. Feels that she has worse range of motion. Continues to have pain.  Assessment & Plan: Visit Diagnoses:  1. Trapezius strain, left, initial encounter   2. Chronic left shoulder pain   3. Closed fracture of proximal end of left humerus with routine healing, subsequent encounter   4. Neck pain     Plan: We'll update orders with [facility]. They will continue working with her for her shoulder and now her neck. Discussed that she may continue using heat for her neck pain.  Follow-Up Instructions: Return in about 4 weeks (around 06/20/2016), or if symptoms worsen or fail to improve.   Physical Exam  Constitutional: Appears well-developed.  Head: Normocephalic.  Eyes: EOM are normal.  Neck: Normal range of motion.  Cardiovascular: Normal rate.   Pulmonary/Chest: Effort normal.  Neurological: Is alert.  Skin: Skin is warm.  Psychiatric: Has a normal mood and affect. Ecchymosis to the left upper arm from dry needling. Back Exam   Tenderness  The patient is experiencing tenderness in the  cervical.  Comments:  Full flexion and extension and rotation of her neck. Negative Spurling's.  Does have trapezius tenderness on the left   Left Hand Exam   Muscle Strength  Grip:  5/5    Left Shoulder Exam   Tenderness  Left shoulder tenderness location: Global.  Tests  Drop Arm: negative  Other  Pulse: present   Comments:  Full abduction. Able to touch her head.       Imaging: Xr Cervical Spine 2 Or 3 Views  Result Date: 05/23/2016 Radiograpsh of the cervical spine show stable alignment. Disc spaces are well-preserved. There is no bone spurring. No fracture.  Xr Shoulder Left  Result Date: 05/23/2016 Grafts of the left shoulder show a healed proximal humerus fracture   Labs: Lab Results  Component Value Date   HGBA1C 5.4 12/21/2015   HGBA1C 6.4 09/20/2014   HGBA1C 7.8 (A) 06/30/2014   GRAMSTAIN Rare 01/02/2016   GRAMSTAIN WBC present-predominately PMN 01/02/2016   GRAMSTAIN No Squamous Epithelial Cells Seen 01/02/2016   GRAMSTAIN No Organisms Seen 01/02/2016   LABORGA NO GROWTH 2 DAYS 01/02/2016    Orders:  Orders Placed This Encounter  Procedures  . XR Shoulder Left  . XR Cervical Spine 2 or 3 views   No orders of the defined types were placed in this encounter.    Procedures: No procedures performed  Clinical Data:  No additional findings.  ROS:  All other systems negative, except as noted in the HPI. Review of Systems  Constitutional: Negative for chills and fever.  Musculoskeletal: Positive for arthralgias, myalgias and neck pain. Negative for neck stiffness.  Neurological: Positive for numbness. Negative for weakness.    Objective: Vital Signs: Ht 5' (1.524 m)   Wt 200 lb (90.7 kg)   BMI 39.06 kg/m   Specialty Comments:  No specialty comments available.  PMFS History: Patient Active Problem List   Diagnosis Date Noted  . Closed fracture of proximal end of left humerus with routine healing, subsequent encounter 12/15/2015   . GERD (gastroesophageal reflux disease) 02/15/2014  . IBS (irritable bowel syndrome) 02/15/2014  . Osteopenia 02/08/2014  . Ocular myasthenia gravis ([place]) 10/07/2013  . Vitamin D deficiency 08/27/2013  . Hypothyroidism 08/27/2013  . Migraine 08/14/2013  . Bipolar 2 disorder ([place]) 03/05/2011  . Diabetes mellitus ([place]) 03/05/2011  . Fatty infiltration of liver 03/02/2011   Past Medical History:  Diagnosis Date  . Anxiety   . Bipolar disorder ([place])   . Depression   . Diabetes mellitus   . Fatty liver disease, nonalcoholic   . GERD (gastroesophageal reflux disease)   . High cholesterol   . Hypothyroidism     Family History  Problem Relation Age of Onset  . Mental illness Father     Manic Depression  . Alcohol abuse Father   . Stroke Father   . Heart disease Father   . Arthritis Son   . Depression Son   . Cancer Mother 77    peritoneal  . Breast cancer Maternal Grandmother   . Breast cancer Paternal Grandmother     Past Surgical History:  Procedure Laterality Date  . DILATION AND CURETTAGE OF UTERUS    . ERCP  03/03/2011   Procedure: ENDOSCOPIC RETROGRADE CHOLANGIOPANCREATOGRAPHY (ERCP);  Surgeon: Lafayette Dragon, MD;  Location: [facility] ENDOSCOPY;  Service: Endoscopy;  Laterality: N/A;  . [place]   Social History   Occupational History  . PROCESSING ASSIST[employer]   Social History Main Topics  . Smoking status: Never Smoker  . Smokeless tobacco: Never Used  . Alcohol use 0.6 oz/week    1 Standard drinks or equivalent per week     Comment: once every couple months  . Drug use: No  . Sexual activity: Yes    Partners: Male    Birth control/ protection: Post-menopausal

## 2016-06-20 ENCOUNTER — Ambulatory Visit (INDEPENDENT_AMBULATORY_CARE_PROVIDER_SITE_OTHER): Payer: BC Managed Care – PPO | Admitting: Family

## 2016-06-26 ENCOUNTER — Ambulatory Visit (INDEPENDENT_AMBULATORY_CARE_PROVIDER_SITE_OTHER): Payer: BC Managed Care – PPO | Admitting: Physician Assistant

## 2016-06-26 VITALS — BP 104/68 | HR 93 | Temp 98.1°F | Resp 16 | Ht 60.0 in | Wt 200.2 lb

## 2016-06-26 DIAGNOSIS — G43109 Migraine with aura, not intractable, without status migrainosus: Secondary | ICD-10-CM

## 2016-06-26 MED ORDER — KETOROLAC TROMETHAMINE 30 MG/ML IJ SOLN
30.0000 mg | Freq: Once | INTRAMUSCULAR | Status: AC
  Administered 2016-06-26: 30 mg via INTRAMUSCULAR

## 2016-06-26 NOTE — Progress Notes (Signed)
PRIMARY CARE AT [facility] [address], [place] [address] [phone]  Date:  06/26/2016   Name:  Nurah Petrides   DOB:  05/20/1960   MRN:  [id number]  PCP:  Harrison Mons, PA-C    History of Present Illness:  Peni Rupard is a 56 y.o. female patient who presents to PCP with  Chief Complaint  Patient presents with  . Migraine    began during the night - unrelieved by relpax     --3:15a,  With headache and mild nausea.  Abdominal pain and head pain.  She took relpax, which did not help.   Left side of head that is pulsating.  No flashes or halos.  Pain initially 8/10, She would rate the pain currently at 6/10.  She is hydrating well with water.  She does have mild allergies, which she takes zyrtec and flonase, but has not taken it regularly.   She will have her a1c and labs rechecked by her endocrinologist.  Patient Active Problem List   Diagnosis Date Noted  . Closed fracture of proximal end of left humerus with routine healing, subsequent encounter 12/15/2015  . GERD (gastroesophageal reflux disease) 02/15/2014  . IBS (irritable bowel syndrome) 02/15/2014  . Osteopenia 02/08/2014  . Ocular myasthenia gravis ([place]) 10/07/2013  . Vitamin D deficiency 08/27/2013  . Hypothyroidism 08/27/2013  . Migraine 08/14/2013  . Bipolar 2 disorder ([place]) 03/05/2011  . Diabetes mellitus ([place]) 03/05/2011  . Fatty infiltration of liver 03/02/2011    Past Medical History:  Diagnosis Date  . Anxiety   . Bipolar disorder ([place])   . Depression   . Diabetes mellitus   . Fatty liver disease, nonalcoholic   . GERD (gastroesophageal reflux disease)   . High cholesterol   . Hypothyroidism     Past Surgical History:  Procedure Laterality Date  . DILATION AND CURETTAGE OF UTERUS    . ERCP  03/03/2011   Procedure: ENDOSCOPIC RETROGRADE CHOLANGIOPANCREATOGRAPHY (ERCP);  Surgeon: Lafayette Dragon, MD;  Location: [facility] ENDOSCOPY;  Service: Endoscopy;  Laterality: N/A;  . [place]    Social History  Substance Use Topics  . Smoking status: Never Smoker  . Smokeless tobacco: Never Used  . Alcohol use 0.6 oz/week    1 Standard drinks or equivalent per week     Comment: once every couple months    Family History  Problem Relation Age of Onset  . Mental illness Father     Manic Depression  . Alcohol abuse Father   . Stroke Father   . Heart disease Father   . Arthritis Son   . Depression Son   . Cancer Mother 43    peritoneal  . Breast cancer Maternal Grandmother   . Breast cancer Paternal Grandmother     Allergies  Allergen Reactions  . Mestinon [Pyridostigmine Bromide] Nausea And Vomiting    "violently ill"  . Risperidone And Related Other (See Comments)    liver damage    Medication list has been reviewed and updated.  Current Outpatient Prescriptions on File Prior to Visit  Medication Sig Dispense Refill  . alosetron (LOTRONEX) 0.5 MG tablet Take 0.5 mg by mouth 2 (two) times daily.    Marland Kitchen azaTHIOprine (IMURAN) 50 MG tablet Take 50 mg by mouth 3 (three) times daily.     Marland Kitchen buPROPion (WELLBUTRIN XL) 150 MG 24 hr tablet Take 150 mg by mouth daily.    . calcium carbonate 1250 MG capsule Take 1,000 mg by  mouth daily.    . cetirizine (ZYRTEC) 10 MG tablet Take 10 mg by mouth daily.    . cholecalciferol (VITAMIN D) 1000 UNITS tablet Take 5,000 Units by mouth daily.    . clonazePAM (KLONOPIN) 1 MG tablet Take 4 mg by mouth at bedtime. May take 1/2 to 1 tablet during the day as needed    . divalproex (DEPAKOTE ER) 500 MG 24 hr tablet Take 1,500 mg by mouth at bedtime.     Marland Kitchen eletriptan (RELPAX) 40 MG tablet Take 1 tablet (40 mg total) by mouth as needed for migraine. One tablet by mouth at onset of headache. May repeat in 2 hours if headache persists or recurs. 10 tablet 3  . fluticasone (FLONASE) 50 MCG/ACT nasal spray Place 2 sprays into the nose as needed for rhinitis.    Marland Kitchen gabapentin (NEURONTIN) 600 MG tablet Take 600 mg by mouth at bedtime.      .  insulin degludec (TRESIBA) 100 UNIT/ML SOPN FlexTouch Pen Inject into the skin daily at 10 pm.    . Insulin Lispro (HUMALOG KWIKPEN) 200 UNIT/ML SOPN Inject into the skin.    Marland Kitchen lamoTRIgine (LAMICTAL) 100 MG tablet Take 200 mg by mouth every morning.     Marland Kitchen levothyroxine (SYNTHROID, LEVOTHROID) 75 MCG tablet Take 75 mcg by mouth daily before breakfast.    . Melatonin 10 MG CAPS Take by mouth.    . metFORMIN (GLUCOPHAGE-XR) 500 MG 24 hr tablet Take 1,000 mg by mouth 2 (two) times daily.    . naproxen (NAPROSYN) 500 MG tablet Take 500 mg by mouth 2 (two) times daily as needed (for pain).     Marland Kitchen NEEDLE, DISP, 30 G 30G X 3/4" MISC Use as directed, daily blood sugars ( please dispense what patient had previously) 100 each 3  . omeprazole (PRILOSEC) 40 MG capsule Take 1 capsule (40 mg total) by mouth daily. 30 capsule 3  . ondansetron (ZOFRAN-ODT) 8 MG disintegrating tablet Take 0.5-1 tablets (4-8 mg total) by mouth every 8 (eight) hours as needed for nausea. 90 tablet 0  . ONETOUCH VERIO test strip   12  . OXcarbazepine (TRILEPTAL) 150 MG tablet Take 150 mg by mouth at bedtime.     . pravastatin (PRAVACHOL) 40 MG tablet Take 40 mg by mouth daily.    . pregabalin (LYRICA) 75 MG capsule Take 75 mg by mouth every morning.     . promethazine (PHENERGAN) 25 MG tablet Take 1 tablet (25 mg total) by mouth every 8 (eight) hours as needed for nausea or vomiting. 20 tablet 0  . sertraline (ZOLOFT) 25 MG tablet Take 25 mg by mouth daily.    Marland Kitchen topiramate (TOPAMAX) 50 MG tablet Take 1 tablet (50 mg total) by mouth 2 (two) times daily. 180 tablet 3  . TRULICITY 1.5 SF/6.8LE SOPN INJECT 1.5 MG WEEKLY FOR DIABETES  11   No current facility-administered medications on file prior to visit.     ROS ROS otherwise unremarkable unless listed above.  Physical Examination: BP 104/68   Pulse 93   Temp 98.1 F (36.7 C) (Oral)   Resp 16   Ht 5' (1.524 m)   Wt 200 lb 3.2 oz (90.8 kg)   SpO2 97%   BMI 39.10 kg/m   Ideal Body Weight: Weight in (lb) to have BMI = 25: 127.7  Physical Exam  Constitutional: She is oriented to person, place, and time. She appears well-developed and well-nourished. No distress.  HENT:  Head: Normocephalic  and atraumatic.  Right Ear: Tympanic membrane, external ear and ear canal normal.  Left Ear: Tympanic membrane, external ear and ear canal normal.  Nose: Mucosal edema and rhinorrhea present. Right sinus exhibits no maxillary sinus tenderness and no frontal sinus tenderness. Left sinus exhibits no maxillary sinus tenderness and no frontal sinus tenderness.  Mouth/Throat: No uvula swelling. No oropharyngeal exudate, posterior oropharyngeal edema or posterior oropharyngeal erythema.  Eyes: Conjunctivae and EOM are normal. Pupils are equal, round, and reactive to light.  Cardiovascular: Normal rate and regular rhythm.  Exam reveals no gallop, no distant heart sounds and no friction rub.   No murmur heard. Pulmonary/Chest: Effort normal. No respiratory distress. She has no decreased breath sounds. She has no wheezes. She has no rhonchi.  Lymphadenopathy:       Head (right side): No submandibular, no tonsillar, no preauricular and no posterior auricular adenopathy present.       Head (left side): No submandibular, no tonsillar, no preauricular and no posterior auricular adenopathy present.  Neurological: She is alert and oriented to person, place, and time. She has normal strength. No cranial nerve deficit. Gait normal.  Normal f to n  Skin: She is not diaphoretic.  Psychiatric: She has a normal mood and affect. Her behavior is normal.     Assessment and Plan: Phaedra Colgate is a 56 y.o. female who is here today for cc of headache.  Vitals within normal limits. Typical migraine symptoms for patient. Will give toradol 30 today.  Follow up as needed. Migraine with aura and without status migrainosus, not intractable - Plan: ketorolac (TORADOL) 30 MG/ML injection 30  mg  Ivar Drape, PA-C [facility] 5/9/20189:30 PM

## 2016-06-27 ENCOUNTER — Encounter: Payer: Self-pay | Admitting: Physician Assistant

## 2016-07-24 ENCOUNTER — Ambulatory Visit (INDEPENDENT_AMBULATORY_CARE_PROVIDER_SITE_OTHER): Payer: BC Managed Care – PPO | Admitting: Physician Assistant

## 2016-07-24 ENCOUNTER — Encounter: Payer: Self-pay | Admitting: Physician Assistant

## 2016-07-24 VITALS — BP 112/64 | HR 89 | Temp 98.1°F | Resp 16 | Ht 60.0 in | Wt 197.8 lb

## 2016-07-24 DIAGNOSIS — G7000 Myasthenia gravis without (acute) exacerbation: Secondary | ICD-10-CM | POA: Diagnosis not present

## 2016-07-24 DIAGNOSIS — F3181 Bipolar II disorder: Secondary | ICD-10-CM

## 2016-07-24 DIAGNOSIS — E119 Type 2 diabetes mellitus without complications: Secondary | ICD-10-CM

## 2016-07-24 DIAGNOSIS — Z794 Long term (current) use of insulin: Secondary | ICD-10-CM

## 2016-07-24 DIAGNOSIS — J069 Acute upper respiratory infection, unspecified: Secondary | ICD-10-CM

## 2016-07-24 MED ORDER — GUAIFENESIN ER 1200 MG PO TB12: 1.0000 | ORAL_TABLET | Freq: Two times a day (BID) | ORAL | 1 refills | Status: DC | PRN

## 2016-07-24 MED ORDER — AZELASTINE HCL 0.1 % NA SOLN: 2.0000 | Freq: Two times a day (BID) | NASAL | 0 refills | Status: AC

## 2016-07-24 NOTE — Patient Instructions (Addendum)
   IF you received an x-ray today, you will receive an invoice from [facility]. Please contact [facility] at [phone] with questions or concerns regarding your invoice.   IF you received labwork today, you will receive an invoice from [facility]. Please contact [facility] at [phone] with questions or concerns regarding your invoice.   Our billing staff will not be able to assist you with questions regarding bills from these companies.  You will be contacted with the lab results as soon as they are available. The fastest way to get your results is to activate your My Chart account. Instructions are located on the last page of this paperwork. If you have not heard from us regarding the results in 2 weeks, please contact this office.      Upper Respiratory Infection, Adult Most upper respiratory infections (URIs) are a viral infection of the air passages leading to the lungs. A URI affects the nose, throat, and upper air passages. The most common type of URI is nasopharyngitis and is typically referred to as "the common cold." URIs run their course and usually go away on their own. Most of the time, a URI does not require medical attention, but sometimes a bacterial infection in the upper airways can follow a viral infection. This is called a secondary infection. Sinus and middle ear infections are common types of secondary upper respiratory infections. Bacterial pneumonia can also complicate a URI. A URI can worsen asthma and chronic obstructive pulmonary disease (COPD). Sometimes, these complications can require emergency medical care and may be life threatening. What are the causes? Almost all URIs are caused by viruses. A virus is a type of germ and can spread from one person to another. What increases the risk? You may be at risk for a URI if:  You smoke.  You have chronic heart or lung disease.  You have a weakened defense (immune) system.  You are very  young or very old.  You have nasal allergies or asthma.  You work in crowded or poorly ventilated areas.  You work in health care facilities or schools. What are the signs or symptoms? Symptoms typically develop 2-3 days after you come in contact with a cold virus. Most viral URIs last 7-10 days. However, viral URIs from the influenza virus (flu virus) can last 14-18 days and are typically more severe. Symptoms may include:  Runny or stuffy (congested) nose.  Sneezing.  Cough.  Sore throat.  Headache.  Fatigue.  Fever.  Loss of appetite.  Pain in your forehead, behind your eyes, and over your cheekbones (sinus pain).  Muscle aches. How is this diagnosed? Your health care provider may diagnose a URI by:  Physical exam.  Tests to check that your symptoms are not due to another condition such as:  Strep throat.  Sinusitis.  Pneumonia.  Asthma. How is this treated? A URI goes away on its own with time. It cannot be cured with medicines, but medicines may be prescribed or recommended to relieve symptoms. Medicines may help:  Reduce your fever.  Reduce your cough.  Relieve nasal congestion. Follow these instructions at home:  Take medicines only as directed by your health care provider.  Gargle warm saltwater or take cough drops to comfort your throat as directed by your health care provider.  Use a warm mist humidifier or inhale steam from a shower to increase air moisture. This may make it easier to breathe.  Drink enough fluid to keep your urine clear   or pale yellow.  Eat soups and other clear broths and maintain good nutrition.  Rest as needed.  Return to work when your temperature has returned to normal or as your health care provider advises. You may need to stay home longer to avoid infecting others. You can also use a face mask and careful hand washing to prevent spread of the virus.  Increase the usage of your inhaler if you have asthma.  Do not  use any tobacco products, including cigarettes, chewing tobacco, or electronic cigarettes. If you need help quitting, ask your health care provider. How is this prevented? The best way to protect yourself from getting a cold is to practice good hygiene.  Avoid oral or hand contact with people with cold symptoms.  Wash your hands often if contact occurs. There is no clear evidence that vitamin C, vitamin E, echinacea, or exercise reduces the chance of developing a cold. However, it is always recommended to get plenty of rest, exercise, and practice good nutrition. Contact a health care provider if:  You are getting worse rather than better.  Your symptoms are not controlled by medicine.  You have chills.  You have worsening shortness of breath.  You have brown or red mucus.  You have yellow or brown nasal discharge.  You have pain in your face, especially when you bend forward.  You have a fever.  You have swollen neck glands.  You have pain while swallowing.  You have white areas in the back of your throat. Get help right away if:  You have severe or persistent:  Headache.  Ear pain.  Sinus pain.  Chest pain.  You have chronic lung disease and any of the following:  Wheezing.  Prolonged cough.  Coughing up blood.  A change in your usual mucus.  You have a stiff neck.  You have changes in your:  Vision.  Hearing.  Thinking.  Mood. This information is not intended to replace advice given to you by your health care provider. Make sure you discuss any questions you have with your health care provider. Document Released: 08/01/2000 Document Revised: 10/09/2015 Document Reviewed: 05/13/2013 Elsevier Interactive Patient Education  2017 Elsevier Inc.  

## 2016-07-24 NOTE — Progress Notes (Signed)
Patient ID: Michelle Torres, female    DOB: 1960/06/19, 56 y.o.   MRN: [id number]  PCP: Harrison Mons, PA-C  Chief Complaint  Patient presents with  . Dizziness    this morning upon awaking; with nausea  . Hyperglycemia    per patient, glucose this morning was 129  . Sinus Problem    per patient, pressure in sinus region    Subjective:   Presents for evaluation of dizziness, present upon waking this morning. She is accompanied by her husband.  Associated headache, ringing in her ears, nausea, sore throat. Mild non-productive cough. Notes several nights of sleep disturbance due to post-nasal drainage. No fever, chills. No vomiting. No diarrhea. No myalgias.  She took a dose of Relpax and headache resolved.  Ome glucose was higher than normal for her this morning: 129 (usually low-70's).  Review of Systems Constitutional: Positive for fatigue.  HENT: Positive for congestion, postnasal drip, sinus pressure, sore throat and tinnitus.   Eyes: Negative.   Respiratory: Positive for cough.   Cardiovascular: Negative.   Gastrointestinal: Positive for nausea.  Endocrine: Negative.   Genitourinary: Negative.   Musculoskeletal: Negative.   Skin: Negative.   Allergic/Immunologic: Negative.   Neurological: Positive for dizziness.  Hematological: Negative.   Psychiatric/Behavioral: Negative.    Patient Active Problem List   Diagnosis Date Noted  . GERD (gastroesophageal reflux disease) 02/15/2014  . IBS (irritable bowel syndrome) 02/15/2014  . Osteopenia 02/08/2014  . Ocular myasthenia gravis ([place]) 10/07/2013  . Vitamin D deficiency 08/27/2013  . Hypothyroidism 08/27/2013  . Migraine 08/14/2013  . Bipolar 2 disorder ([place]) 03/05/2011  . Diabetes mellitus ([place]) 03/05/2011  . Fatty infiltration of liver 03/02/2011     Prior to Admission medications   Medication Sig Start Date End Date Taking? Authorizing Provider  alosetron (LOTRONEX) 0.5 MG tablet Take 0.5 mg by mouth  2 (two) times daily.   Yes [provider]  azaTHIOprine (IMURAN) 50 MG tablet Take 50 mg by mouth 3 (three) times daily.    Yes [provider]  buPROPion (WELLBUTRIN XL) 150 MG 24 hr tablet Take 150 mg by mouth daily.   Yes [provider]  calcium carbonate 1250 MG capsule Take 1,000 mg by mouth daily.   Yes [provider]  cetirizine (ZYRTEC) 10 MG tablet Take 10 mg by mouth daily.   Yes [provider]  cholecalciferol (VITAMIN D) 1000 UNITS tablet Take 5,000 Units by mouth daily.   Yes [provider]  clonazePAM (KLONOPIN) 1 MG tablet Take 3 mg by mouth at bedtime. May take 1/2 to 1 tablet during the day as needed    Yes [provider]  divalproex (DEPAKOTE ER) 500 MG 24 hr tablet Take 1,500 mg by mouth at bedtime.    Yes [provider]  eletriptan (RELPAX) 40 MG tablet Take 1 tablet (40 mg total) by mouth as needed for migraine. One tablet by mouth at onset of headache. May repeat in 2 hours if headache persists or recurs. 03/27/16  Yes Payslee Bateson, PA-C  fluticasone (FLONASE) 50 MCG/ACT nasal spray Place 2 sprays into the nose as needed for rhinitis.   Yes [provider]  gabapentin (NEURONTIN) 600 MG tablet Take 600 mg by mouth at bedtime.     Yes [provider]  insulin degludec (TRESIBA) 100 UNIT/ML SOPN FlexTouch Pen Inject 50 Units into the skin daily at 10 pm.    Yes [provider]  Insulin Lispro (HUMALOG  KWIKPEN) 200 UNIT/ML SOPN Inject into the skin.   Yes [provider]  lamoTRIgine (LAMICTAL) 100 MG tablet Take 200 mg by mouth every morning.    Yes [provider]  levothyroxine (SYNTHROID, LEVOTHROID) 75 MCG tablet Take 75 mcg by mouth daily before breakfast.   Yes [provider]  Melatonin 10 MG CAPS Take by mouth.   Yes [provider]  metFORMIN (GLUCOPHAGE-XR) 500 MG 24 hr tablet Take 1,000 mg by mouth 2 (two) times daily.   Yes  [provider]  naproxen (NAPROSYN) 500 MG tablet Take 500 mg by mouth 2 (two) times daily as needed (for pain).    Yes [provider]  NEEDLE, DISP, 30 G 30G X 3/4" MISC Use as directed, daily blood sugars ( please dispense what patient had previously) 07/14/13  Yes Le, Thao P, DO  omeprazole (PRILOSEC) 40 MG capsule Take 1 capsule (40 mg total) by mouth daily. 02/08/14  Yes Lanecia Sliva, PA-C  ondansetron (ZOFRAN-ODT) 8 MG disintegrating tablet Take 0.5-1 tablets (4-8 mg total) by mouth every 8 (eight) hours as needed for nausea. 08/26/14  Yes Harrison Mons, PA-C  ONETOUCH VERIO test strip  01/25/14  Yes [provider]  OXcarbazepine (TRILEPTAL) 150 MG tablet Take 150 mg by mouth at bedtime.    Yes [provider]  pravastatin (PRAVACHOL) 40 MG tablet Take 40 mg by mouth daily.   Yes [provider]  pregabalin (LYRICA) 75 MG capsule Take 75 mg by mouth every morning.    Yes [provider]  promethazine (PHENERGAN) 25 MG tablet Take 1 tablet (25 mg total) by mouth every 8 (eight) hours as needed for nausea or vomiting. 03/27/16  Yes Aniesa Boback, PA-C  sertraline (ZOLOFT) 25 MG tablet Take 25 mg by mouth daily.   Yes [provider]  topiramate (TOPAMAX) 50 MG tablet Take 1 tablet (50 mg total) by mouth 2 (two) times daily. 03/27/16  Yes Nohemi Nicklaus, PA-C  TRULICITY 1.5 IR/5.1OA SOPN INJECT 1.5 MG WEEKLY FOR DIABETES 09/22/14  Yes [provider]     Allergies  Allergen Reactions  . Mestinon [Pyridostigmine Bromide] Nausea And Vomiting    "violently ill"  . Risperidone And Related Other (See Comments)    liver damage       Objective:  Physical Exam  Constitutional: She is oriented to person, place, and time. She appears well-developed and well-nourished. No distress.  BP 112/64 (BP Location: Right Arm, Patient Position: Sitting, Cuff Size: Large)   Pulse 89   Temp 98.1 F (36.7 C) (Oral)   Resp 16   Ht 5'  (1.524 m)   Wt 197 lb 12.8 oz (89.7 kg)   SpO2 93%   BMI 38.63 kg/m    HENT:  Head: Normocephalic and atraumatic.  Right Ear: Hearing, tympanic membrane, external ear and ear canal normal.  Left Ear: Hearing, tympanic membrane, external ear and ear canal normal.  Nose: Mucosal edema and rhinorrhea present.  No foreign bodies. Right sinus exhibits no maxillary sinus tenderness and no frontal sinus tenderness. Left sinus exhibits no maxillary sinus tenderness and no frontal sinus tenderness.  Mouth/Throat: Uvula is midline, oropharynx is clear and moist and mucous membranes are normal. No uvula swelling. No oropharyngeal exudate.  Eyes: Conjunctivae and EOM are normal. Pupils are equal, round, and reactive to light. Right eye exhibits no discharge. Left eye exhibits no discharge. No scleral icterus.  Neck: Trachea normal, normal range of motion and full passive  range of motion without pain. Neck supple. No thyroid mass and no thyromegaly present.  Cardiovascular: Normal rate, regular rhythm and normal heart sounds.   Pulmonary/Chest: Effort normal and breath sounds normal.  Lymphadenopathy:       Head (right side): No submandibular, no tonsillar, no preauricular, no posterior auricular and no occipital adenopathy present.       Head (left side): No submandibular, no tonsillar, no preauricular and no occipital adenopathy present.    She has no cervical adenopathy.       Right: No supraclavicular adenopathy present.       Left: No supraclavicular adenopathy present.  Neurological: She is alert and oriented to person, place, and time. She has normal strength. No cranial nerve deficit or sensory deficit.  Skin: Skin is warm, dry and intact. No rash noted.  Psychiatric: She has a normal mood and affect. Her speech is normal and behavior is normal.        Assessment & Plan:   Problem List Items Addressed This Visit    Bipolar 2 disorder ([place])    Continue treatment and follow-up per  psychiatry.      Diabetes mellitus ([place])    Continue treatment and follow-up per endocrinology.      Ocular myasthenia gravis [facility])    Continue follow-up with specialty care.       Other Visit Diagnoses    Viral URI    -  Primary   supportive care. Anticipatory guidance provided.   Relevant Medications   Guaifenesin (MUCINEX MAXIMUM STRENGTH) 1200 MG TB12   azelastine (ASTELIN) 0.1 % nasal spray       Return if symptoms worsen or fail to improve.   Fara Chute, PA-C [facility] [place]

## 2016-07-24 NOTE — Progress Notes (Signed)
Subjective:    Patient ID: Michelle Torres, female    DOB: [birth year], 56 y.o.   MRN: [id number]  HPI Sinus P, HA, dizzy when moving around, ringing in ears, nausea, sore throat. Little cough- nonproductive Blood sugar 129 this am normally 71. No vision changes. Going on for since sudnay night.  Sleep is hard dt post nasal drip and difficulty breathing. eating and drinking good. No V, D, C, fever or chills.   Patient Active Problem List   Diagnosis Date Noted  . GERD (gastroesophageal reflux disease) 02/15/2014  . IBS (irritable bowel syndrome) 02/15/2014  . Osteopenia 02/08/2014  . Ocular myasthenia gravis ([place]) 10/07/2013  . Vitamin D deficiency 08/27/2013  . Hypothyroidism 08/27/2013  . Migraine 08/14/2013  . Bipolar 2 disorder ([place]) 03/05/2011  . Diabetes mellitus ([place]) 03/05/2011  . Fatty infiltration of liver 03/02/2011   Prior to Admission medications   Medication Sig Start Date End Date Taking? Authorizing Provider  alosetron (LOTRONEX) 0.5 MG tablet Take 0.5 mg by mouth 2 (two) times daily.   Yes [provider]  azaTHIOprine (IMURAN) 50 MG tablet Take 50 mg by mouth 3 (three) times daily.    Yes [provider]  buPROPion (WELLBUTRIN XL) 150 MG 24 hr tablet Take 150 mg by mouth daily.   Yes [provider]  calcium carbonate 1250 MG capsule Take 1,000 mg by mouth daily.   Yes [provider]  cetirizine (ZYRTEC) 10 MG tablet Take 10 mg by mouth daily.   Yes [provider]  cholecalciferol (VITAMIN D) 1000 UNITS tablet Take 5,000 Units by mouth daily.   Yes [provider]  clonazePAM (KLONOPIN) 1 MG tablet Take 3 mg by mouth at bedtime. May take 1/2 to 1 tablet during the day as needed    Yes [provider]  divalproex (DEPAKOTE ER) 500 MG 24 hr tablet Take 1,500 mg by mouth at bedtime.    Yes [provider]  eletriptan (RELPAX) 40 MG tablet Take 1 tablet (40 mg total) by mouth as needed for migraine.  One tablet by mouth at onset of headache. May repeat in 2 hours if headache persists or recurs. 03/27/16  Yes Jeffery, Chelle, PA-C  fluticasone (FLONASE) 50 MCG/ACT nasal spray Place 2 sprays into the nose as needed for rhinitis.   Yes [provider]  gabapentin (NEURONTIN) 600 MG tablet Take 600 mg by mouth at bedtime.     Yes [provider]  insulin degludec (TRESIBA) 100 UNIT/ML SOPN FlexTouch Pen Inject 50 Units into the skin daily at 10 pm.    Yes [provider]  Insulin Lispro (HUMALOG KWIKPEN) 200 UNIT/ML SOPN Inject into the skin.   Yes [provider]  lamoTRIgine (LAMICTAL) 100 MG tablet Take 200 mg by mouth every morning.    Yes [provider]  levothyroxine (SYNTHROID, LEVOTHROID) 75 MCG tablet Take 75 mcg by mouth daily before breakfast.   Yes [provider]  Melatonin 10 MG CAPS Take by mouth.   Yes [provider]  metFORMIN (GLUCOPHAGE-XR) 500 MG 24 hr tablet Take 1,000 mg by mouth 2 (two) times daily.   Yes [provider]  naproxen (NAPROSYN) 500 MG tablet Take 500 mg by mouth 2 (two) times daily as needed (for pain).    Yes [provider]  NEEDLE, DISP, 30 G 30G X 3/4" MISC Use as directed, daily blood sugars ( please dispense what patient had previously) 07/14/13  Yes Le, Thao P,  DO  omeprazole (PRILOSEC) 40 MG capsule Take 1 capsule (40 mg total) by mouth daily. 02/08/14  Yes Jeffery, Chelle, PA-C  ondansetron (ZOFRAN-ODT) 8 MG disintegrating tablet Take 0.5-1 tablets (4-8 mg total) by mouth every 8 (eight) hours as needed for nausea. 08/26/14  Yes Harrison Mons, PA-C  ONETOUCH VERIO test strip  01/25/14  Yes [provider]  OXcarbazepine (TRILEPTAL) 150 MG tablet Take 150 mg by mouth at bedtime.    Yes [provider]  pravastatin (PRAVACHOL) 40 MG tablet Take 40 mg by mouth daily.   Yes [provider]  pregabalin (LYRICA) 75 MG capsule Take 75 mg by mouth every  morning.    Yes [provider]  promethazine (PHENERGAN) 25 MG tablet Take 1 tablet (25 mg total) by mouth every 8 (eight) hours as needed for nausea or vomiting. 03/27/16  Yes Jeffery, Chelle, PA-C  sertraline (ZOLOFT) 25 MG tablet Take 25 mg by mouth daily.   Yes [provider]  topiramate (TOPAMAX) 50 MG tablet Take 1 tablet (50 mg total) by mouth 2 (two) times daily. 03/27/16  Yes Jeffery, Chelle, PA-C  TRULICITY 1.5 HB/7.1IR SOPN INJECT 1.5 MG WEEKLY FOR DIABETES 09/22/14  Yes [provider]  azelastine (ASTELIN) 0.1 % nasal spray Place 2 sprays into both nostrils 2 (two) times daily. Use in each nostril as directed 07/24/16   Harrison Mons, PA-C  Guaifenesin (MUCINEX MAXIMUM STRENGTH) 1200 MG TB12 Take 1 tablet (1,200 mg total) by mouth every 12 (twelve) hours as needed. 07/24/16   Harrison Mons, PA-C   Allergies  Allergen Reactions  . Mestinon [Pyridostigmine Bromide] Nausea And Vomiting    "violently ill"  . Risperidone And Related Other (See Comments)    liver damage    Review of Systems  Constitutional: Positive for fatigue.  HENT: Positive for congestion, postnasal drip, sinus pressure, sore throat and tinnitus.   Eyes: Negative.   Respiratory: Positive for cough.   Cardiovascular: Negative.   Gastrointestinal: Positive for nausea.  Endocrine: Negative.   Genitourinary: Negative.   Musculoskeletal: Negative.   Skin: Negative.   Allergic/Immunologic: Negative.   Neurological: Positive for dizziness.  Hematological: Negative.   Psychiatric/Behavioral: Negative.        Objective:   Physical Exam  Constitutional: She is oriented to person, place, and time. She appears well-developed and well-nourished. No distress.  HENT:  Head: Normocephalic and atraumatic.  Right Ear: External ear normal.  Left Ear: External ear normal.  Eyes: Conjunctivae and EOM are normal. Pupils are equal, round, and reactive to light.  Neck: Normal range of motion. Neck  supple.  Cardiovascular: Normal rate, regular rhythm, normal heart sounds and intact distal pulses.   Pulmonary/Chest: Effort normal and breath sounds normal.  Abdominal: Soft. Bowel sounds are normal.  Lymphadenopathy:    She has no cervical adenopathy.  Neurological: She is alert and oriented to person, place, and time.  Skin: Skin is warm and dry.  Psychiatric: She has a normal mood and affect. Her behavior is normal. Judgment and thought content normal.          Assessment & Plan:

## 2016-07-25 NOTE — Assessment & Plan Note (Signed)
Continue treatment and follow-up per endocrinology.

## 2016-07-25 NOTE — Assessment & Plan Note (Signed)
Continue treatment and follow-up per psychiatry. 

## 2016-07-25 NOTE — Assessment & Plan Note (Signed)
Continue follow-up with specialty care.

## 2016-08-09 DIAGNOSIS — R740 Nonspecific elevation of levels of transaminase and lactic acid dehydrogenase [LDH]: Secondary | ICD-10-CM

## 2016-08-09 DIAGNOSIS — E782 Mixed hyperlipidemia: Secondary | ICD-10-CM | POA: Insufficient documentation

## 2016-08-09 DIAGNOSIS — R7401 Elevation of levels of liver transaminase levels: Secondary | ICD-10-CM | POA: Insufficient documentation

## 2016-08-21 ENCOUNTER — Telehealth: Payer: Self-pay | Admitting: Physician Assistant

## 2016-08-21 NOTE — Telephone Encounter (Signed)
Message sent to Michelle Torres already spoke with her about this.

## 2016-08-21 NOTE — Telephone Encounter (Signed)
PATIENT WOULD LIKE CHELLE TO KNOW THAT SHE HAS TO FLY ON 08/30/16. [employer] HAS TOLD HER THAT SHE WILL NEED TO GET A LETTER FROM HER PRIMARY CARE PROVIDER AS TO ALL THE MEDICATIONS THAT IS PRESCRIBED BY CHELLE AND Korryn WILL ALSO NEED TO PROVIDE TO THEM HER ORIGINAL BOTTLES. SHE WILL NEED THE LETTER NO LATER THAN 08/29/16 BY 12:00 NOON. SHE WANTS TO KNOW IF CHELLE WILL NEED HER TO SCHEDULE AN APPOINTMENT FIRST OR IF SHE CAN JUST TYPE IT OUT FOR HER? BEST PHONE [phone] (WORK FROM 8:00 - 5:00) OR [phone] IS HER HOME. [place]

## 2016-08-23 NOTE — Telephone Encounter (Signed)
Per Chelle, we need to verify that all medications are correct on MAR. Left message with husband to have wife call back with that information.

## 2016-08-23 NOTE — Telephone Encounter (Signed)
Please advise 

## 2016-08-23 NOTE — Telephone Encounter (Signed)
PT CALLED BACK WITH HER CORRECT MEDS  AZELASTINE,CETIRIZINE,RELPAX,FLONASE,PRILOSEC,ZOFRAN-ODT,PHENERGAN and LYRICA  SAME MG and INSTRUCTIONS HOW HOW TO USE  PT ALSO STATES THAT SHE NEED THIS LETTER ON A LETTER HEAD SIGNED BY CHELLE  BY Tuesday 08-28-16 FOR HER FLIGHT OUT

## 2016-08-23 NOTE — Telephone Encounter (Signed)
Left message and sent My Chart message.  The list provided in the previous phone message doesn't include some of her medications.  If patient calls back: Please remove any medications from the list that she no longer takes. Please add any medications she takes that are not on the list.  Route the message back to me for creating the letter, printing and signing.

## 2016-08-28 ENCOUNTER — Ambulatory Visit (INDEPENDENT_AMBULATORY_CARE_PROVIDER_SITE_OTHER): Payer: BC Managed Care – PPO | Admitting: Emergency Medicine

## 2016-08-28 ENCOUNTER — Encounter: Payer: Self-pay | Admitting: Emergency Medicine

## 2016-08-28 VITALS — BP 128/72 | HR 86 | Temp 98.2°F | Resp 17 | Ht 60.0 in

## 2016-08-28 DIAGNOSIS — R112 Nausea with vomiting, unspecified: Secondary | ICD-10-CM | POA: Diagnosis not present

## 2016-08-28 DIAGNOSIS — G7000 Myasthenia gravis without (acute) exacerbation: Secondary | ICD-10-CM

## 2016-08-28 DIAGNOSIS — G43109 Migraine with aura, not intractable, without status migrainosus: Secondary | ICD-10-CM | POA: Diagnosis not present

## 2016-08-28 DIAGNOSIS — E119 Type 2 diabetes mellitus without complications: Secondary | ICD-10-CM | POA: Diagnosis not present

## 2016-08-28 DIAGNOSIS — Z794 Long term (current) use of insulin: Secondary | ICD-10-CM

## 2016-08-28 LAB — COMPREHENSIVE METABOLIC PANEL
ALT: 24 IU/L (ref 0–32)
AST: 39 IU/L (ref 0–40)
Albumin/Globulin Ratio: 1.6 (ref 1.2–2.2)
Albumin: 4.5 g/dL (ref 3.5–5.5)
Alkaline Phosphatase: 67 IU/L (ref 39–117)
BUN/Creatinine Ratio: 17 (ref 9–23)
BUN: 17 mg/dL (ref 6–24)
Bilirubin Total: 0.4 mg/dL (ref 0.0–1.2)
CO2: 21 mmol/L (ref 20–29)
CREATININE: 1.01 mg/dL — AB (ref 0.57–1.00)
Calcium: 9.7 mg/dL (ref 8.7–10.2)
Chloride: 103 mmol/L (ref 96–106)
GFR, EST AFRICAN AMERICAN: 73 mL/min/{1.73_m2} (ref >59)
GFR, EST NON AFRICAN AMERICAN: 63 mL/min/{1.73_m2} (ref >59)
Globulin, Total: 2.8 g/dL (ref 1.5–4.5)
Glucose: 80 mg/dL (ref 65–99)
Potassium: 4.3 mmol/L (ref 3.5–5.2)
Sodium: 143 mmol/L (ref 134–144)
TOTAL PROTEIN: 7.3 g/dL (ref 6.0–8.5)

## 2016-08-28 LAB — POCT CBC
GRANULOCYTE PERCENT: 62.6 % (ref 37–80)
HEMATOCRIT: 39.1 % (ref 37.7–47.9)
HEMOGLOBIN: 12.9 g/dL (ref 12.2–16.2)
Lymph, poc: 3.8 — AB (ref 0.6–3.4)
MCH: 30.8 pg (ref 27–31.2)
MCHC: 33.1 g/dL (ref 31.8–35.4)
MCV: 93 fL (ref 80–97)
MID (cbc): 0.6 (ref 0–0.9)
MPV: 7.3 fL (ref 0–99.8)
PLATELET COUNT, POC: 314 10*3/uL (ref 142–424)
POC GRANULOCYTE: 7.4 — AB (ref 2–6.9)
POC LYMPH PERCENT: 32 %L (ref 10–50)
POC MID %: 5.4 %M (ref 0–12)
RBC: 4.2 M/uL (ref 4.04–5.48)
RDW, POC: 16 %
WBC: 11.8 10*3/uL — AB (ref 4.6–10.2)

## 2016-08-28 LAB — GLUCOSE, POCT (MANUAL RESULT ENTRY): POC Glucose: 81 mg/dl (ref 70–99)

## 2016-08-28 MED ORDER — ONDANSETRON HCL 4 MG/2ML IJ SOLN
2.0000 mg | Freq: Once | INTRAMUSCULAR | Status: AC
  Administered 2016-08-28: 2 mg via INTRAVENOUS

## 2016-08-28 MED ORDER — KETOROLAC TROMETHAMINE 30 MG/ML IJ SOLN: 30.0000 mg | Freq: Once | INTRAMUSCULAR | 0 refills | Status: DC

## 2016-08-28 MED ORDER — KETOROLAC TROMETHAMINE 30 MG/ML IJ SOLN: 30.0000 mg | Freq: Once | INTRAMUSCULAR | Status: DC

## 2016-08-28 MED ORDER — KETOROLAC TROMETHAMINE 30 MG/ML IJ SOLN
30.0000 mg | Freq: Once | INTRAMUSCULAR | Status: AC
  Administered 2016-08-28: 30 mg via INTRAVENOUS

## 2016-08-28 MED ORDER — ONDANSETRON HCL 4 MG PO TABS: 4.0000 mg | ORAL_TABLET | Freq: Three times a day (TID) | ORAL | 0 refills | Status: DC | PRN

## 2016-08-28 MED ORDER — SODIUM CHLORIDE 0.9 % IV BOLUS (SEPSIS)
1000.0000 mL | Freq: Once | INTRAVENOUS | Status: AC
  Administered 2016-08-28: 1000 mL via INTRAVENOUS

## 2016-08-28 NOTE — Progress Notes (Signed)
Michelle Torres 56 y.o.   Chief Complaint  Patient presents with  . Nausea  . Emesis    HISTORY OF PRESENT ILLNESS: This is a 56 y.o. female went to bed feeling fine last night and this am woke up with headache and nauseous; went to work and symptoms worsened; started vomiting then.  HPI   Prior to Admission medications   Medication Sig Start Date End Date Taking? Authorizing Provider  alosetron (LOTRONEX) 0.5 MG tablet Take 0.5 mg by mouth 2 (two) times daily.   Yes [provider]  azaTHIOprine (IMURAN) 50 MG tablet Take 50 mg by mouth 3 (three) times daily.    Yes [provider]  azelastine (ASTELIN) 0.1 % nasal spray Place 2 sprays into both nostrils 2 (two) times daily. Use in each nostril as directed 07/24/16  Yes Jeffery, Chelle, PA-C  buPROPion (WELLBUTRIN XL) 150 MG 24 hr tablet Take 150 mg by mouth daily.   Yes [provider]  calcium carbonate 1250 MG capsule Take 1,000 mg by mouth daily.   Yes [provider]  cetirizine (ZYRTEC) 10 MG tablet Take 10 mg by mouth daily.   Yes [provider]  cholecalciferol (VITAMIN D) 1000 UNITS tablet Take 5,000 Units by mouth daily.   Yes [provider]  clonazePAM (KLONOPIN) 1 MG tablet Take 3 mg by mouth at bedtime. May take 1/2 to 1 tablet during the day as needed    Yes [provider]  divalproex (DEPAKOTE ER) 500 MG 24 hr tablet Take 1,500 mg by mouth at bedtime.    Yes [provider]  eletriptan (RELPAX) 40 MG tablet Take 1 tablet (40 mg total) by mouth as needed for migraine. One tablet by mouth at onset of headache. May repeat in 2 hours if headache persists or recurs. 03/27/16  Yes Jeffery, Chelle, PA-C  fluticasone (FLONASE) 50 MCG/ACT nasal spray Place 2 sprays into the nose as needed for rhinitis.   Yes [provider]  gabapentin (NEURONTIN) 600 MG tablet Take 600 mg by mouth at bedtime.     Yes [provider]  insulin degludec (TRESIBA)  100 UNIT/ML SOPN FlexTouch Pen Inject 50 Units into the skin daily at 10 pm.    Yes [provider]  Insulin Lispro (HUMALOG KWIKPEN) 200 UNIT/ML SOPN Inject into the skin.   Yes [provider]  lamoTRIgine (LAMICTAL) 100 MG tablet Take 200 mg by mouth every morning.    Yes [provider]  levothyroxine (SYNTHROID, LEVOTHROID) 75 MCG tablet Take 75 mcg by mouth daily before breakfast.   Yes [provider]  Melatonin 10 MG CAPS Take by mouth.   Yes [provider]  metFORMIN (GLUCOPHAGE-XR) 500 MG 24 hr tablet Take 1,000 mg by mouth 2 (two) times daily.   Yes [provider]  naproxen (NAPROSYN) 500 MG tablet Take 500 mg by mouth 2 (two) times daily as needed (for pain).    Yes [provider]  NEEDLE, DISP, 30 G 30G X 3/4" MISC Use as directed, daily blood sugars ( please dispense what patient had previously) 07/14/13  Yes Le, Thao P, DO  omeprazole (PRILOSEC) 40 MG capsule Take 1 capsule (40 mg total) by mouth daily. 02/08/14  Yes Jeffery, Chelle, PA-C  ondansetron (ZOFRAN-ODT) 8 MG disintegrating tablet Take 0.5-1 tablets (4-8 mg total) by mouth every 8 (eight) hours as needed for nausea. 08/26/14  Yes Jeffery, Domingo Mend, PA-C  ONETOUCH VERIO test strip  01/25/14  Yes  [provider]  OXcarbazepine (TRILEPTAL) 150 MG tablet Take 150 mg by mouth at bedtime.    Yes [provider]  pravastatin (PRAVACHOL) 40 MG tablet Take 40 mg by mouth daily.   Yes [provider]  pregabalin (LYRICA) 75 MG capsule Take 75 mg by mouth every morning.    Yes [provider]  promethazine (PHENERGAN) 25 MG tablet Take 1 tablet (25 mg total) by mouth every 8 (eight) hours as needed for nausea or vomiting. 03/27/16  Yes Jeffery, Chelle, PA-C  sertraline (ZOLOFT) 25 MG tablet Take 25 mg by mouth daily.   Yes [provider]  topiramate (TOPAMAX) 50 MG tablet Take 1 tablet (50 mg total) by mouth 2 (two) times daily.  03/27/16  Yes Jeffery, Chelle, PA-C  TRULICITY 1.5 PI/9.5JO SOPN INJECT 1.5 MG WEEKLY FOR DIABETES 09/22/14  Yes [provider]    Allergies  Allergen Reactions  . Mestinon [Pyridostigmine Bromide] Nausea And Vomiting    "violently ill"  . Risperidone And Related Other (See Comments)    liver damage    Patient Active Problem List   Diagnosis Date Noted  . GERD (gastroesophageal reflux disease) 02/15/2014  . IBS (irritable bowel syndrome) 02/15/2014  . Osteopenia 02/08/2014  . Ocular myasthenia gravis ([place]) 10/07/2013  . Vitamin D deficiency 08/27/2013  . Hypothyroidism 08/27/2013  . Migraine 08/14/2013  . Bipolar 2 disorder ([place]) 03/05/2011  . Diabetes mellitus ([place]) 03/05/2011  . Fatty infiltration of liver 03/02/2011    Past Medical History:  Diagnosis Date  . Anxiety   . Bipolar disorder ([place])   . Closed fracture of proximal end of left humerus with routine healing, subsequent encounter 12/15/2015  . Depression   . Diabetes mellitus   . Fatty liver disease, nonalcoholic   . GERD (gastroesophageal reflux disease)   . High cholesterol   . Hypothyroidism     Past Surgical History:  Procedure Laterality Date  . DILATION AND CURETTAGE OF UTERUS    . ERCP  03/03/2011   Procedure: ENDOSCOPIC RETROGRADE CHOLANGIOPANCREATOGRAPHY (ERCP);  Surgeon: Lafayette Dragon, MD;  Location: [facility] ENDOSCOPY;  Service: Endoscopy;  Laterality: N/A;  . [place]    Social History   Social History  . Marital status: Married    Spouse name: [name] Goodgame  . Number of children: 2  . Years of education: 14   Occupational History  . [employer]ING [employer]IST[employer]   Social History Main Topics  . Smoking status: Never Smoker  . Smokeless tobacco: Never Used  . Alcohol use 0.6 oz/week    1 Standard drinks or equivalent per week     Comment: once every couple months  . Drug use: No  . Sexual activity: Yes    Partners: Male    Birth control/  protection: Post-menopausal   Other Topics Concern  . Not on file   Social History Narrative   Lives with her husband.  Their daughter, lives with them intermittently.  Her son is at [employer] and lives with them. Her husband has a child from a previous relationship who lives in [place].    Family History  Problem Relation Age of Onset  . Mental illness Father        Manic Depression  . Alcohol abuse Father   . Stroke Father   . Heart disease Father   . Arthritis Son   . Depression Son   . Cancer Mother 35       peritoneal  .  Breast cancer Maternal Grandmother   . Breast cancer Paternal Grandmother      Review of Systems  Constitutional: Negative.  Negative for chills and fever.  HENT: Negative.  Negative for ear pain, nosebleeds and sore throat.   Eyes: Positive for blurred vision. Negative for discharge and redness.       No vision from left eye  Respiratory: Negative.  Negative for cough and shortness of breath.   Cardiovascular: Negative.  Negative for chest pain and palpitations.  Gastrointestinal: Positive for nausea and vomiting. Negative for abdominal pain, blood in stool, diarrhea and melena.  Genitourinary: Negative.  Negative for dysuria and hematuria.  Musculoskeletal: Negative for back pain, myalgias and neck pain.  Skin: Negative.  Negative for rash.  Neurological: Positive for headaches. Negative for dizziness, sensory change and focal weakness.  Endo/Heme/Allergies: Negative.   All other systems reviewed and are negative.  Vitals:   08/28/16 1110  BP: 128/72  Pulse: 86  Resp: 17  Temp: 98.2 F (36.8 C)     Physical Exam  Constitutional: She is oriented to person, place, and time. She appears well-developed and well-nourished.  HENT:  Head: Normocephalic and atraumatic.  Nose: Nose normal.  Mouth/Throat: Oropharynx is clear and moist.  Eyes:  Left eye blacked out with lens.  Neck: Normal range of motion. Neck supple. No JVD present.  Cardiovascular:  Normal rate, regular rhythm and normal heart sounds.   Pulmonary/Chest: Effort normal and breath sounds normal.  Abdominal: Soft. Bowel sounds are normal. She exhibits no distension. There is no tenderness.  Musculoskeletal: Normal range of motion.  Lymphadenopathy:    She has no cervical adenopathy.  Neurological: She is alert and oriented to person, place, and time. No sensory deficit. She exhibits normal muscle tone.  Skin: Skin is dry. Capillary refill takes less than 2 seconds.  Psychiatric: She has a normal mood and affect.  Vitals reviewed.  Much improved after IVF's and IV Zofran and Toradol.  Results for orders placed or performed in visit on 08/28/16 (from the past 24 hour(s))  POCT glucose (manual entry)     Status: None   Collection Time: 08/28/16 11:43 AM  Result Value Ref Range   POC Glucose 81 70 - 99 mg/dl  POCT CBC     Status: Abnormal   Collection Time: 08/28/16 11:45 AM  Result Value Ref Range   WBC 11.8 (A) 4.6 - 10.2 K/uL   Lymph, poc 3.8 (A) 0.6 - 3.4   POC LYMPH PERCENT 32.0 10 - 50 %L   MID (cbc) 0.6 0 - 0.9   POC MID % 5.4 0 - 12 %M   POC Granulocyte 7.4 (A) 2 - 6.9   Granulocyte percent 62.6 37 - 80 %G   RBC 4.20 4.04 - 5.48 M/uL   Hemoglobin 12.9 12.2 - 16.2 g/dL   HCT, POC 39.1 37.7 - 47.9 %   MCV 93.0 80 - 97 fL   MCH, POC 30.8 27 - 31.2 pg   MCHC 33.1 31.8 - 35.4 g/dL   RDW, POC 16.0 %   Platelet Count, POC 314 142 - 424 K/uL   MPV 7.3 0 - 99.8 fL     ASSESSMENT & PLAN: Bianey was seen today for nausea and emesis.  Diagnoses and all orders for this visit:  Nausea and vomiting, intractability of vomiting not specified, unspecified vomiting type -     Care order/instruction: -     ondansetron (ZOFRAN) injection 2 mg; Inject 1 mL (  2 mg total) into the vein once. -     Comprehensive metabolic panel -     POCT CBC -     POCT glucose (manual entry) -     sodium chloride 0.9 % bolus 1,000 mL; Inject 1,000 mLs into the vein once.  Ocular  myasthenia gravis ([place])  Type 2 diabetes mellitus without complication, with long-term current use of insulin (HCC)  Migraine with aura and without status migrainosus, not intractable -     Discontinue: ketorolac (TORADOL) 30 MG/ML injection 30 mg; Inject 1 mL (30 mg total) into the muscle once. -     ketorolac (TORADOL) 30 MG/ML injection 30 mg; Inject 1 mL (30 mg total) into the vein once.  Other orders -     Discontinue: ketorolac (TORADOL) 30 MG/ML injection; Inject 1 mL (30 mg total) into the vein once. -     ondansetron (ZOFRAN) 4 MG tablet; Take 1 tablet (4 mg total) by mouth every 8 (eight) hours as needed for nausea or vomiting.    Patient Instructions   pc    IF you received an x-ray today, you will receive an invoice from [facility]. Please contact [facility] at [phone] with questions or concerns regarding your invoice.   IF you received labwork today, you will receive an invoice from [facility]. Please contact [facility] at [phone] with questions or concerns regarding your invoice.   Our billing staff will not be able to assist you with questions regarding bills from these companies.  You will be contacted with the lab results as soon as they are available. The fastest way to get your results is to activate your My Chart account. Instructions are located on the last page of this paperwork. If you have not heard from [place] regarding the results in 2 weeks, please contact this office.       Nausea and Vomiting, Adult Feeling sick to your stomach (nausea) means that your stomach is upset or you feel like you have to throw up (vomit). Feeling more and more sick to your stomach can lead to throwing up. Throwing up happens when food and liquid from your stomach are thrown up and out the mouth. Throwing up can make you feel weak and cause you to get dehydrated. Dehydration can make you tired and thirsty, make you have a dry mouth, and make it so you pee  (urinate) less often. Older adults and people with other diseases or a weak defense system (immune system) are at higher risk for dehydration. If you feel sick to your stomach or if you throw up, it is important to follow instructions from your doctor about how to take care of yourself. Follow these instructions at home: Eating and drinking Follow these instructions as told by your doctor:  Take an oral rehydration solution (ORS). This is a drink that is sold at pharmacies and stores.  Drink clear fluids in small amounts as you are able, such as: ? Water. ? Ice chips. ? Diluted fruit juice. ? Low-calorie sports drinks.  Eat bland, easy-to-digest foods in small amounts as you are able, such as: ? Bananas. ? Applesauce. ? Rice. ? Low-fat (lean) meats. ? Toast. ? Crackers.  Avoid fluids that have a lot of sugar or caffeine in them.  Avoid alcohol.  Avoid spicy or fatty foods.  General instructions  Drink enough fluid to keep your pee (urine) clear or pale yellow.  Wash your hands often. If you cannot use soap and water,  use hand sanitizer.  Make sure that all people in your home wash their hands well and often.  Take over-the-counter and prescription medicines only as told by your doctor.  Rest at home while you get better.  Watch your condition for any changes.  Breathe slowly and deeply when you feel sick to your stomach.  Keep all follow-up visits as told by your doctor. This is important. Contact a doctor if:  You have a fever.  You cannot keep fluids down.  Your symptoms get worse.  You have new symptoms.  You feel sick to your stomach for more than two days.  You feel light-headed or dizzy.  You have a headache.  You have muscle cramps. Get help right away if:  You have pain in your chest, neck, arm, or jaw.  You feel very weak or you pass out (faint).  You throw up again and again.  You see blood in your throw-up.  Your throw-up looks like  black coffee grounds.  You have bloody or black poop (stools) or poop that look like tar.  You have a very bad headache, a stiff neck, or both.  You have a rash.  You have very bad pain, cramping, or bloating in your belly (abdomen).  You have trouble breathing.  You are breathing very quickly.  Your heart is beating very quickly.  Your skin feels cold and clammy.  You feel confused.  You have pain when you pee.  You have signs of dehydration, such as: ? Dark pee, hardly any pee, or no pee. ? Cracked lips. ? Dry mouth. ? Sunken eyes. ? Sleepiness. ? Weakness. These symptoms may be an emergency. Do not wait to see if the symptoms will go away. Get medical help right away. Call your local emergency services (911 in the [place] Do not drive yourself to the hospital. This information is not intended to replace advice given to you by your health care provider. Make sure you discuss any questions you have with your health care provider. Document Released: 07/25/2007 Document Revised: 08/26/2015 Document Reviewed: 10/12/2014 Elsevier Interactive Patient Education  2018 [employer]vier Inc.      Agustina Caroli, MD [facility]

## 2016-08-28 NOTE — Patient Instructions (Addendum)
pc    IF you received an x-ray today, you will receive an invoice from [facility]. Please contact [facility] at [phone] with questions or concerns regarding your invoice.   IF you received labwork today, you will receive an invoice from [facility]. Please contact [facility] at [phone] with questions or concerns regarding your invoice.   Our billing staff will not be able to assist you with questions regarding bills from these companies.  You will be contacted with the lab results as soon as they are available. The fastest way to get your results is to activate your My Chart account. Instructions are located on the last page of this paperwork. If you have not heard from [place] regarding the results in 2 weeks, please contact this office.       Nausea and Vomiting, Adult Feeling sick to your stomach (nausea) means that your stomach is upset or you feel like you have to throw up (vomit). Feeling more and more sick to your stomach can lead to throwing up. Throwing up happens when food and liquid from your stomach are thrown up and out the mouth. Throwing up can make you feel weak and cause you to get dehydrated. Dehydration can make you tired and thirsty, make you have a dry mouth, and make it so you pee (urinate) less often. Older adults and people with other diseases or a weak defense system (immune system) are at higher risk for dehydration. If you feel sick to your stomach or if you throw up, it is important to follow instructions from your doctor about how to take care of yourself. Follow these instructions at home: Eating and drinking Follow these instructions as told by your doctor:  Take an oral rehydration solution (ORS). This is a drink that is sold at pharmacies and stores.  Drink clear fluids in small amounts as you are able, such as: ? Water. ? Ice chips. ? Diluted fruit juice. ? Low-calorie sports drinks.  Eat bland, easy-to-digest foods in small amounts  as you are able, such as: ? Bananas. ? Applesauce. ? Rice. ? Low-fat (lean) meats. ? Toast. ? Crackers.  Avoid fluids that have a lot of sugar or caffeine in them.  Avoid alcohol.  Avoid spicy or fatty foods.  General instructions  Drink enough fluid to keep your pee (urine) clear or pale yellow.  Wash your hands often. If you cannot use soap and water, use hand sanitizer.  Make sure that all people in your home wash their hands well and often.  Take over-the-counter and prescription medicines only as told by your doctor.  Rest at home while you get better.  Watch your condition for any changes.  Breathe slowly and deeply when you feel sick to your stomach.  Keep all follow-up visits as told by your doctor. This is important. Contact a doctor if:  You have a fever.  You cannot keep fluids down.  Your symptoms get worse.  You have new symptoms.  You feel sick to your stomach for more than two days.  You feel light-headed or dizzy.  You have a headache.  You have muscle cramps. Get help right away if:  You have pain in your chest, neck, arm, or jaw.  You feel very weak or you pass out (faint).  You throw up again and again.  You see blood in your throw-up.  Your throw-up looks like black coffee grounds.  You have bloody or black poop (stools) or poop that look like  tar.  You have a very bad headache, a stiff neck, or both.  You have a rash.  You have very bad pain, cramping, or bloating in your belly (abdomen).  You have trouble breathing.  You are breathing very quickly.  Your heart is beating very quickly.  Your skin feels cold and clammy.  You feel confused.  You have pain when you pee.  You have signs of dehydration, such as: ? Dark pee, hardly any pee, or no pee. ? Cracked lips. ? Dry mouth. ? Sunken eyes. ? Sleepiness. ? Weakness. These symptoms may be an emergency. Do not wait to see if the symptoms will go away. Get medical  help right away. Call your local emergency services (911 in the [place] Do not drive yourself to the hospital. This information is not intended to replace advice given to you by your health care provider. Make sure you discuss any questions you have with your health care provider. Document Released: 07/25/2007 Document Revised: 08/26/2015 Document Reviewed: 10/12/2014 Elsevier Interactive Patient Education  2018 [employer].

## 2016-08-29 ENCOUNTER — Encounter: Payer: Self-pay | Admitting: Emergency Medicine

## 2016-09-03 ENCOUNTER — Encounter: Payer: Self-pay | Admitting: Physician Assistant

## 2016-09-03 ENCOUNTER — Ambulatory Visit (INDEPENDENT_AMBULATORY_CARE_PROVIDER_SITE_OTHER): Payer: BC Managed Care – PPO | Admitting: Physician Assistant

## 2016-09-03 VITALS — BP 109/71 | HR 81 | Temp 97.7°F | Resp 18 | Ht 60.0 in | Wt 199.8 lb

## 2016-09-03 DIAGNOSIS — R05 Cough: Secondary | ICD-10-CM | POA: Diagnosis not present

## 2016-09-03 DIAGNOSIS — J029 Acute pharyngitis, unspecified: Secondary | ICD-10-CM

## 2016-09-03 DIAGNOSIS — J3489 Other specified disorders of nose and nasal sinuses: Secondary | ICD-10-CM

## 2016-09-03 DIAGNOSIS — R0981 Nasal congestion: Secondary | ICD-10-CM

## 2016-09-03 DIAGNOSIS — R059 Cough, unspecified: Secondary | ICD-10-CM

## 2016-09-03 MED ORDER — BENZONATATE 100 MG PO CAPS: 100.0000 mg | ORAL_CAPSULE | Freq: Three times a day (TID) | ORAL | 0 refills | Status: DC | PRN

## 2016-09-03 MED ORDER — MUCINEX DM MAXIMUM STRENGTH 60-1200 MG PO TB12: 1.0000 | ORAL_TABLET | Freq: Two times a day (BID) | ORAL | 1 refills | Status: DC

## 2016-09-03 NOTE — Progress Notes (Signed)
Michelle Torres  MRN: [id number] DOB: [birth year]  PCP: Harrison Mons, PA-C  Subjective:  Pt is a 56 year old female who presents to clinic for sinus problems x 2 days. She was on an airplane a few days ago and the following day developed sinus pressure, runny nose and fatigue. Endorses mild sore throat and cough. She is not sleeping well due to sinus pressure and cough. She has not taken anything to feel better.  Denies fever, chills, n/v, chest pain, wheezing, shob.   Review of Systems  Constitutional: Negative for chills, diaphoresis, fatigue and fever.  HENT: Positive for congestion, postnasal drip, rhinorrhea, sinus pressure and sore throat. Negative for sneezing.   Respiratory: Positive for cough. Negative for chest tightness, shortness of breath and wheezing.   Cardiovascular: Negative for chest pain and palpitations.  Gastrointestinal: Negative for abdominal pain, diarrhea, nausea and vomiting.  Neurological: Negative for weakness, light-headedness and headaches.  Psychiatric/Behavioral: Positive for sleep disturbance.    Patient Active Problem List   Diagnosis Date Noted  . Nausea and vomiting 08/28/2016  . GERD (gastroesophageal reflux disease) 02/15/2014  . IBS (irritable bowel syndrome) 02/15/2014  . Osteopenia 02/08/2014  . Ocular myasthenia gravis ([place]) 10/07/2013  . Vitamin D deficiency 08/27/2013  . Hypothyroidism 08/27/2013  . Migraine 08/14/2013  . Bipolar 2 disorder ([place]) 03/05/2011  . Diabetes mellitus ([place]) 03/05/2011  . Fatty infiltration of liver 03/02/2011    Current Outpatient Prescriptions on File Prior to Visit  Medication Sig Dispense Refill  . alosetron (LOTRONEX) 0.5 MG tablet Take 0.5 mg by mouth 2 (two) times daily.    Marland Kitchen azaTHIOprine (IMURAN) 50 MG tablet Take 50 mg by mouth 3 (three) times daily.     Marland Kitchen azelastine (ASTELIN) 0.1 % nasal spray Place 2 sprays into both nostrils 2 (two) times daily. Use in each nostril as directed 30 mL 0  .  buPROPion (WELLBUTRIN XL) 150 MG 24 hr tablet Take 150 mg by mouth daily.    . calcium carbonate 1250 MG capsule Take 1,000 mg by mouth daily.    . cetirizine (ZYRTEC) 10 MG tablet Take 10 mg by mouth daily.    . cholecalciferol (VITAMIN D) 1000 UNITS tablet Take 5,000 Units by mouth daily.    . clonazePAM (KLONOPIN) 1 MG tablet Take 3 mg by mouth at bedtime. May take 1/2 to 1 tablet during the day as needed     . divalproex (DEPAKOTE ER) 500 MG 24 hr tablet Take 1,500 mg by mouth at bedtime.     Marland Kitchen eletriptan (RELPAX) 40 MG tablet Take 1 tablet (40 mg total) by mouth as needed for migraine. One tablet by mouth at onset of headache. May repeat in 2 hours if headache persists or recurs. 10 tablet 3  . fluticasone (FLONASE) 50 MCG/ACT nasal spray Place 2 sprays into the nose as needed for rhinitis.    Marland Kitchen gabapentin (NEURONTIN) 600 MG tablet Take 600 mg by mouth at bedtime.      . insulin degludec (TRESIBA) 100 UNIT/ML SOPN FlexTouch Pen Inject 50 Units into the skin daily at 10 pm.     . Insulin Lispro (HUMALOG KWIKPEN) 200 UNIT/ML SOPN Inject into the skin.    Marland Kitchen lamoTRIgine (LAMICTAL) 100 MG tablet Take 200 mg by mouth every morning.     Marland Kitchen levothyroxine (SYNTHROID, LEVOTHROID) 75 MCG tablet Take 75 mcg by mouth daily before breakfast.    . Melatonin 10 MG CAPS Take by mouth.    . metFORMIN (  GLUCOPHAGE-XR) 500 MG 24 hr tablet Take 1,000 mg by mouth 2 (two) times daily.    . naproxen (NAPROSYN) 500 MG tablet Take 500 mg by mouth 2 (two) times daily as needed (for pain).     Marland Kitchen NEEDLE, DISP, 30 G 30G X 3/4" MISC Use as directed, daily blood sugars ( please dispense what patient had previously) 100 each 3  . omeprazole (PRILOSEC) 40 MG capsule Take 1 capsule (40 mg total) by mouth daily. 30 capsule 3  . ondansetron (ZOFRAN) 4 MG tablet Take 1 tablet (4 mg total) by mouth every 8 (eight) hours as needed for nausea or vomiting. 20 tablet 0  . ONETOUCH VERIO test strip   12  . OXcarbazepine (TRILEPTAL) 150  MG tablet Take 150 mg by mouth at bedtime.     . pravastatin (PRAVACHOL) 40 MG tablet Take 40 mg by mouth daily.    . pregabalin (LYRICA) 75 MG capsule Take 75 mg by mouth every morning.     . promethazine (PHENERGAN) 25 MG tablet Take 1 tablet (25 mg total) by mouth every 8 (eight) hours as needed for nausea or vomiting. 20 tablet 0  . sertraline (ZOLOFT) 25 MG tablet Take 25 mg by mouth daily.    Marland Kitchen topiramate (TOPAMAX) 50 MG tablet Take 1 tablet (50 mg total) by mouth 2 (two) times daily. 180 tablet 3  . TRULICITY 1.5 GE/3.6OQ SOPN INJECT 1.5 MG WEEKLY FOR DIABETES  11  . ondansetron (ZOFRAN-ODT) 8 MG disintegrating tablet Take 0.5-1 tablets (4-8 mg total) by mouth every 8 (eight) hours as needed for nausea. (Patient not taking: Reported on 09/03/2016) 90 tablet 0   No current facility-administered medications on file prior to visit.     Allergies  Allergen Reactions  . Mestinon [Pyridostigmine Bromide] Nausea And Vomiting    "violently ill"  . Risperidone And Related Other (See Comments)    liver damage     Objective:  BP 109/71 (BP Location: Right Arm, Patient Position: Sitting, Cuff Size: Large)   Pulse 81   Temp 97.7 F (36.5 C) (Oral)   Resp 18   Ht 5' (1.524 m)   Wt 199 lb 12.8 oz (90.6 kg)   SpO2 95%   BMI 39.02 kg/m   Physical Exam  Constitutional: She is oriented to person, place, and time and well-developed, well-nourished, and in no distress. No distress.  HENT:  Right Ear: Tympanic membrane normal.  Left Ear: Tympanic membrane normal.  Nose: Mucosal edema present. No rhinorrhea. Right sinus exhibits no maxillary sinus tenderness and no frontal sinus tenderness. Left sinus exhibits no maxillary sinus tenderness and no frontal sinus tenderness.  Mouth/Throat: Oropharynx is clear and moist and mucous membranes are normal.  Cardiovascular: Normal rate, regular rhythm and normal heart sounds.   Pulmonary/Chest: Breath sounds normal. No respiratory distress. She has no  wheezes. She has no rales.  Neurological: She is alert and oriented to person, place, and time. GCS score is 15.  Skin: Skin is warm and dry.  Psychiatric: Mood, memory, affect and judgment normal.  Vitals reviewed.   Assessment and Plan :  1. Acute pharyngitis, unspecified etiology 2. Sinus pressure 3. Cough 4. Nasal congestion - benzonatate (TESSALON) 100 MG capsule; Take 1-2 capsules (100-200 mg total) by mouth 3 (three) times daily as needed for cough.  Dispense: 40 capsule; Refill: 0 - Dextromethorphan-Guaifenesin (MUCINEX DM MAXIMUM STRENGTH) 60-1200 MG TB12; Take 1 tablet by mouth every 12 (twelve) hours.  Dispense: 14 each; Refill:  1 - Will treat supportively: Push fluids, salt water gargles, nasal rinses, and rest. RTC in 7 days if no improvement.  Note written to stay out of work tomorrow. She will RTC tomorrow evening if she desires another day off work.    Mercer Pod, PA-C  [facility] 09/03/2016 5:43 PM

## 2016-09-03 NOTE — Patient Instructions (Addendum)
Use a neti pot to help clear your sinuses. Hot showers will break up your mucus.  Warm tea with agave and lemon. You may gargle liquid Benadryl for sore throat. See below for salt water gargle recipe.  If you are not well enough to go to work, come back and get another work note.  Come back in 7 days if you are not better.  Thank you for coming in today. I hope you feel we met your needs.  Feel free to call [facility] if you have any questions or further requests.  Please consider signing up for MyChart if you do not already have it, as this is a great way to communicate with me.  Best,  Whitney McVey, PA-C    Pharyngitis Pharyngitis is redness, pain, and swelling (inflammation) of your pharynx. What are the causes? Pharyngitis is usually caused by infection. Most of the time, these infections are from viruses (viral) and are part of a cold. However, sometimes pharyngitis is caused by bacteria (bacterial). Pharyngitis can also be caused by allergies. Viral pharyngitis may be spread from person to person by coughing, sneezing, and personal items or utensils (cups, forks, spoons, toothbrushes). Bacterial pharyngitis may be spread from person to person by more intimate contact, such as kissing. What are the signs or symptoms? Symptoms of pharyngitis include:  Sore throat.  Tiredness (fatigue).  Low-grade fever.  Headache.  Joint pain and muscle aches.  Skin rashes.  Swollen lymph nodes.  Plaque-like film on throat or tonsils (often seen with bacterial pharyngitis).  How is this diagnosed? Your health care provider will ask you questions about your illness and your symptoms. Your medical history, along with a physical exam, is often all that is needed to diagnose pharyngitis. Sometimes, a rapid strep test is done. Other lab tests may also be done, depending on the suspected cause. How is this treated? Viral pharyngitis will usually get better in 3-4 days without the use of medicine.  Bacterial pharyngitis is treated with medicines that kill germs (antibiotics). Follow these instructions at home:  Drink enough water and fluids to keep your urine clear or pale yellow.  Only take over-the-counter or prescription medicines as directed by your health care provider: ? If you are prescribed antibiotics, make sure you finish them even if you start to feel better. ? Do not take aspirin.  Get lots of rest.  Gargle with 8 oz of salt water ( tsp of salt per 1 qt of water) as often as every 1-2 hours to soothe your throat.  Throat lozenges (if you are not at risk for choking) or sprays may be used to soothe your throat. Contact a health care provider if:  You have large, tender lumps in your neck.  You have a rash.  You cough up green, yellow-brown, or bloody spit. Get help right away if:  Your neck becomes stiff.  You drool or are unable to swallow liquids.  You vomit or are unable to keep medicines or liquids down.  You have severe pain that does not go away with the use of recommended medicines.  You have trouble breathing (not caused by a stuffy nose). This information is not intended to replace advice given to you by your health care provider. Make sure you discuss any questions you have with your health care provider. Document Released: 02/05/2005 Document Revised: 07/14/2015 Document Reviewed: 10/13/2012 Elsevier Interactive Patient Education  2017 [employer].  IF you received an x-ray today, you will receive an  invoice from [facility]. Please contact [facility] at [phone] with questions or concerns regarding your invoice.   IF you received labwork today, you will receive an invoice from [place]. Please contact [facility] at [phone] with questions or concerns regarding your invoice.   Our billing staff will not be able to assist you with questions regarding bills from these companies.  You will be contacted with the lab results  as soon as they are available. The fastest way to get your results is to activate your My Chart account. Instructions are located on the last page of this paperwork. If you have not heard from [place] regarding the results in 2 weeks, please contact this office.

## 2016-09-17 ENCOUNTER — Ambulatory Visit (INDEPENDENT_AMBULATORY_CARE_PROVIDER_SITE_OTHER): Payer: BC Managed Care – PPO | Admitting: Physician Assistant

## 2016-09-17 ENCOUNTER — Encounter: Payer: Self-pay | Admitting: Physician Assistant

## 2016-09-17 VITALS — BP 84/61 | HR 84 | Temp 99.0°F | Resp 18 | Ht 60.0 in | Wt 196.8 lb

## 2016-09-17 DIAGNOSIS — G43009 Migraine without aura, not intractable, without status migrainosus: Secondary | ICD-10-CM

## 2016-09-17 DIAGNOSIS — R112 Nausea with vomiting, unspecified: Secondary | ICD-10-CM | POA: Diagnosis not present

## 2016-09-17 MED ORDER — KETOROLAC TROMETHAMINE 60 MG/2ML IM SOLN
60.0000 mg | Freq: Once | INTRAMUSCULAR | Status: AC
  Administered 2016-09-17: 60 mg via INTRAMUSCULAR

## 2016-09-17 MED ORDER — PROMETHAZINE HCL 25 MG/ML IJ SOLN
25.0000 mg | Freq: Once | INTRAMUSCULAR | Status: AC
  Administered 2016-09-17: 25 mg via INTRAMUSCULAR

## 2016-09-17 NOTE — Progress Notes (Signed)
Michelle Torres  MRN: [id number] DOB: [birth year]  PCP: Harrison Mons, PA-C  Chief Complaint  Patient presents with  . Migraine    started around 3am this morning pt feels it is on her right side and has taken her zofran today     Subjective:  Pt presents to clinic for migraine - she was awoken at 2 am from the headache- she took her relpax (she repeated) and zofran but she got no relief.  Emesis about 4 and 6 am and she had continued nausea.  She has been in the bed with ice packs all day.  She has been trying to get the headache to go away - she has been trying to stay hydrated.  She has not been able to eat today.  Headache right frontal area.  No neck pain.  She paresthesias.  Light sensitive.  This is her typical migraine headache.    Glucose this am - 124 and 94 this afternoon   Problems with low BP - her BP today is normal for her  History is obtained by patient.  Review of Systems  Constitutional: Negative for chills and fever.  Eyes: Positive for photophobia. Negative for visual disturbance.  Gastrointestinal: Positive for nausea.  Neurological: Positive for headaches. Negative for dizziness and weakness.    Patient Active Problem List   Diagnosis Date Noted  . Nausea and vomiting 08/28/2016  . GERD (gastroesophageal reflux disease) 02/15/2014  . IBS (irritable bowel syndrome) 02/15/2014  . Osteopenia 02/08/2014  . Ocular myasthenia gravis ([place]) 10/07/2013  . Vitamin D deficiency 08/27/2013  . Hypothyroidism 08/27/2013  . Migraine 08/14/2013  . Bipolar 2 disorder ([place]) 03/05/2011  . Diabetes mellitus ([place]) 03/05/2011  . Fatty infiltration of liver 03/02/2011    Current Outpatient Prescriptions on File Prior to Visit  Medication Sig Dispense Refill  . alosetron (LOTRONEX) 0.5 MG tablet Take 0.5 mg by mouth 2 (two) times daily.    Marland Kitchen azaTHIOprine (IMURAN) 50 MG tablet Take 50 mg by mouth 3 (three) times daily.     Marland Kitchen azelastine (ASTELIN) 0.1 % nasal spray Place  2 sprays into both nostrils 2 (two) times daily. Use in each nostril as directed 30 mL 0  . buPROPion (WELLBUTRIN XL) 150 MG 24 hr tablet Take 150 mg by mouth daily.    . calcium carbonate 1250 MG capsule Take 1,000 mg by mouth daily.    . cetirizine (ZYRTEC) 10 MG tablet Take 10 mg by mouth daily.    . cholecalciferol (VITAMIN D) 1000 UNITS tablet Take 5,000 Units by mouth daily.    . clonazePAM (KLONOPIN) 1 MG tablet Take 3 mg by mouth at bedtime. May take 1/2 to 1 tablet during the day as needed     . divalproex (DEPAKOTE ER) 500 MG 24 hr tablet Take 1,500 mg by mouth at bedtime.     Marland Kitchen eletriptan (RELPAX) 40 MG tablet Take 1 tablet (40 mg total) by mouth as needed for migraine. One tablet by mouth at onset of headache. May repeat in 2 hours if headache persists or recurs. 10 tablet 3  . fluticasone (FLONASE) 50 MCG/ACT nasal spray Place 2 sprays into the nose as needed for rhinitis.    Marland Kitchen gabapentin (NEURONTIN) 600 MG tablet Take 600 mg by mouth at bedtime.      . insulin degludec (TRESIBA) 100 UNIT/ML SOPN FlexTouch Pen Inject 50 Units into the skin daily at 10 pm.     . Insulin Lispro (HUMALOG KWIKPEN) 200  UNIT/ML SOPN Inject into the skin.    Marland Kitchen lamoTRIgine (LAMICTAL) 100 MG tablet Take 200 mg by mouth every morning.     Marland Kitchen levothyroxine (SYNTHROID, LEVOTHROID) 75 MCG tablet Take 75 mcg by mouth daily before breakfast.    . Melatonin 10 MG CAPS Take by mouth.    . metFORMIN (GLUCOPHAGE-XR) 500 MG 24 hr tablet Take 1,000 mg by mouth 2 (two) times daily.    . naproxen (NAPROSYN) 500 MG tablet Take 500 mg by mouth 2 (two) times daily as needed (for pain).     Marland Kitchen NEEDLE, DISP, 30 G 30G X 3/4" MISC Use as directed, daily blood sugars ( please dispense what patient had previously) 100 each 3  . omeprazole (PRILOSEC) 40 MG capsule Take 1 capsule (40 mg total) by mouth daily. 30 capsule 3  . ondansetron (ZOFRAN) 4 MG tablet Take 1 tablet (4 mg total) by mouth every 8 (eight) hours as needed for nausea  or vomiting. 20 tablet 0  . ondansetron (ZOFRAN-ODT) 8 MG disintegrating tablet Take 0.5-1 tablets (4-8 mg total) by mouth every 8 (eight) hours as needed for nausea. 90 tablet 0  . ONETOUCH VERIO test strip   12  . OXcarbazepine (TRILEPTAL) 150 MG tablet Take 150 mg by mouth at bedtime.     . pravastatin (PRAVACHOL) 40 MG tablet Take 40 mg by mouth daily.    . pregabalin (LYRICA) 75 MG capsule Take 75 mg by mouth every morning.     . promethazine (PHENERGAN) 25 MG tablet Take 1 tablet (25 mg total) by mouth every 8 (eight) hours as needed for nausea or vomiting. 20 tablet 0  . sertraline (ZOLOFT) 25 MG tablet Take 25 mg by mouth daily.    Marland Kitchen topiramate (TOPAMAX) 50 MG tablet Take 1 tablet (50 mg total) by mouth 2 (two) times daily. 180 tablet 3  . TRULICITY 1.5 WN/4.6EV SOPN INJECT 1.5 MG WEEKLY FOR DIABETES  11   No current facility-administered medications on file prior to visit.     Allergies  Allergen Reactions  . Mestinon [Pyridostigmine Bromide] Nausea And Vomiting    "violently ill"  . Risperidone And Related Other (See Comments)    liver damage    Past Medical History:  Diagnosis Date  . Anxiety   . Bipolar disorder ([place])   . Closed fracture of proximal end of left humerus with routine healing, subsequent encounter 12/15/2015  . Depression   . Diabetes mellitus   . Fatty liver disease, nonalcoholic   . GERD (gastroesophageal reflux disease)   . High cholesterol   . Hypothyroidism    Social History   Social History Narrative   Lives with her husband.  Their daughter, lives with them intermittently.  Her son is at [facility] and lives with them. Her husband has a child from a previous relationship who lives in [place].   Social History  Substance Use Topics  . Smoking status: Never Smoker  . Smokeless tobacco: Never Used  . Alcohol use 0.6 oz/week    1 Standard drinks or equivalent per week     Comment: once every couple months   family history includes Alcohol abuse in  her father; Arthritis in her son; Breast cancer in her maternal grandmother and paternal grandmother; Cancer (age of onset: 73) in her mother; Depression in her son; Heart disease in her father; Mental illness in her father; Stroke in her father.     Objective:  BP (!) 84/61   Pulse 84  Temp 99 F (37.2 C) (Oral)   Resp 18   Ht 5' (1.524 m)   Wt 196 lb 12.8 oz (89.3 kg)   SpO2 97%   BMI 38.43 kg/m  Body mass index is 38.43 kg/m.  Physical Exam  Constitutional: She is oriented to person, place, and time and well-developed, well-nourished, and in no distress.  HENT:  Head: Normocephalic and atraumatic.  Right Ear: Hearing and external ear normal.  Left Ear: Hearing and external ear normal.  Eyes: Pupils are equal, round, and reactive to light. Conjunctivae, EOM and lids are normal.  photophobic  Neck: Normal range of motion.  Cardiovascular: Normal rate, regular rhythm and normal heart sounds.   No murmur heard. Pulmonary/Chest: Effort normal and breath sounds normal. She has no wheezes.  Neurological: She is alert and oriented to person, place, and time. She has normal sensation, normal strength, normal reflexes and intact cranial nerves. She displays no weakness and facial symmetry. No sensory deficit. Gait normal. Coordination and gait normal.  Skin: Skin is warm and dry.  Psychiatric: Mood, memory, affect and judgment normal.  Vitals reviewed.   Assessment and Plan :  Migraine without aura and without status migrainosus, not intractable - Plan: ketorolac (TORADOL) injection 60 mg, promethazine (PHENERGAN) injection 25 mg  Non-intractable vomiting with nausea, unspecified vomiting type - Plan: promethazine (PHENERGAN) injection 25 mg   Pt has a typical migraine that has not been responsive to her home treatment regimen and she will need injectable treatment at this time.  Medications were given and she will go home and rest.  Her husband is with her to drive her  home.  Windell Hummingbird PA-C  [facility] 09/18/2016 1:58 PM

## 2016-09-17 NOTE — Patient Instructions (Signed)
     IF you received an x-ray today, you will receive an invoice from [facility]. Please contact [facility] at [phone] with questions or concerns regarding your invoice.   IF you received labwork today, you will receive an invoice from [facility]. Please contact [facility] at [phone] with questions or concerns regarding your invoice.   Our billing staff will not be able to assist you with questions regarding bills from these companies.  You will be contacted with the lab results as soon as they are available. The fastest way to get your results is to activate your My Chart account. Instructions are located on the last page of this paperwork. If you have not heard from us regarding the results in 2 weeks, please contact this office.     

## 2016-09-22 ENCOUNTER — Other Ambulatory Visit: Payer: Self-pay | Admitting: Physician Assistant

## 2016-09-22 DIAGNOSIS — J069 Acute upper respiratory infection, unspecified: Secondary | ICD-10-CM

## 2016-10-16 ENCOUNTER — Ambulatory Visit (INDEPENDENT_AMBULATORY_CARE_PROVIDER_SITE_OTHER): Payer: BC Managed Care – PPO | Admitting: Family Medicine

## 2016-10-16 ENCOUNTER — Encounter: Payer: Self-pay | Admitting: Family Medicine

## 2016-10-16 VITALS — BP 108/57 | HR 84 | Temp 97.9°F | Resp 18 | Ht 60.5 in | Wt 204.0 lb

## 2016-10-16 DIAGNOSIS — G43009 Migraine without aura, not intractable, without status migrainosus: Secondary | ICD-10-CM | POA: Diagnosis not present

## 2016-10-16 MED ORDER — PROMETHAZINE HCL 25 MG/ML IJ SOLN
25.0000 mg | Freq: Once | INTRAMUSCULAR | Status: AC
Start: 2016-10-16 — End: 2016-10-16
  Administered 2016-10-16: 25 mg via INTRAMUSCULAR

## 2016-10-16 MED ORDER — PROMETHAZINE HCL 25 MG PO TABS: 25.0000 mg | ORAL_TABLET | Freq: Three times a day (TID) | ORAL | 0 refills | Status: DC | PRN

## 2016-10-16 MED ORDER — KETOROLAC TROMETHAMINE 60 MG/2ML IM SOLN
60.0000 mg | Freq: Once | INTRAMUSCULAR | Status: AC
  Administered 2016-10-16: 60 mg via INTRAMUSCULAR

## 2016-10-16 NOTE — Patient Instructions (Signed)
     IF you received an x-ray today, you will receive an invoice from [facility]. Please contact [facility] at [phone] with questions or concerns regarding your invoice.   IF you received labwork today, you will receive an invoice from [facility]. Please contact [facility] at [phone] with questions or concerns regarding your invoice.   Our billing staff will not be able to assist you with questions regarding bills from these companies.  You will be contacted with the lab results as soon as they are available. The fastest way to get your results is to activate your My Chart account. Instructions are located on the last page of this paperwork. If you have not heard from us regarding the results in 2 weeks, please contact this office.     

## 2016-10-16 NOTE — Progress Notes (Signed)
8/28/201810:00 AM  Michelle Torres Sep 23, 1960, 56 y.o. female [id number]  Chief Complaint  Patient presents with  . Migraine    HPI:   Patient is a 56 y.o. female with known migraines who presents today for migraine that started this morning, right sided, behind her eye, sharp, mild photophobia, + nausea and vomiting x 1. Took her home regime of relpax and zofran without relief of symptoms. tolerating ice chips, sips of water. Has done well with toradol and phenegran when this happens. Gets about one migraine a month. Last seen for similar July 2018. Her husband is here to drive her back.  Depression screen [facility] 2/9 09/17/2016 09/03/2016 07/24/2016  Decreased Interest 0 0 1  Down, Depressed, Hopeless 0 0 2  PHQ - 2 Score 0 0 3  Altered sleeping - - 3  Tired, decreased energy - - 2  Change in appetite - - 2  Feeling bad or failure about yourself  - - 3  Trouble concentrating - - 1  Moving slowly or fidgety/restless - - 2  Suicidal thoughts - - 0  PHQ-9 Score - - 16  Difficult doing work/chores - - Somewhat difficult    Allergies  Allergen Reactions  . Mestinon [Pyridostigmine Bromide] Nausea And Vomiting    "violently ill"  . Risperidone And Related Other (See Comments)    liver damage    Current Outpatient Prescriptions on File Prior to Visit  Medication Sig Dispense Refill  . alosetron (LOTRONEX) 0.5 MG tablet Take 0.5 mg by mouth 2 (two) times daily.    Marland Kitchen azaTHIOprine (IMURAN) 50 MG tablet Take 50 mg by mouth 3 (three) times daily.     Marland Kitchen azelastine (ASTELIN) 0.1 % nasal spray Place 2 sprays into both nostrils 2 (two) times daily. Use in each nostril as directed 30 mL 0  . buPROPion (WELLBUTRIN XL) 150 MG 24 hr tablet Take 150 mg by mouth daily.    . calcium carbonate 1250 MG capsule Take 1,000 mg by mouth daily.    . cetirizine (ZYRTEC) 10 MG tablet Take 10 mg by mouth daily.    . cholecalciferol (VITAMIN D) 1000 UNITS tablet Take 5,000 Units by mouth daily.    .  clonazePAM (KLONOPIN) 1 MG tablet Take 3 mg by mouth at bedtime. May take 1/2 to 1 tablet during the day as needed     . divalproex (DEPAKOTE ER) 500 MG 24 hr tablet Take 1,500 mg by mouth at bedtime.     Marland Kitchen eletriptan (RELPAX) 40 MG tablet Take 1 tablet (40 mg total) by mouth as needed for migraine. One tablet by mouth at onset of headache. May repeat in 2 hours if headache persists or recurs. 10 tablet 3  . fluticasone (FLONASE) 50 MCG/ACT nasal spray Place 2 sprays into the nose as needed for rhinitis.    Marland Kitchen gabapentin (NEURONTIN) 600 MG tablet Take 600 mg by mouth at bedtime.      . insulin degludec (TRESIBA) 100 UNIT/ML SOPN FlexTouch Pen Inject 50 Units into the skin daily at 10 pm.     . Insulin Lispro (HUMALOG KWIKPEN) 200 UNIT/ML SOPN Inject into the skin.    Marland Kitchen lamoTRIgine (LAMICTAL) 100 MG tablet Take 200 mg by mouth every morning.     Marland Kitchen levothyroxine (SYNTHROID, LEVOTHROID) 75 MCG tablet Take 75 mcg by mouth daily before breakfast.    . Melatonin 10 MG CAPS Take by mouth.    . metFORMIN (GLUCOPHAGE-XR) 500 MG 24 hr tablet Take 1,000  mg by mouth 2 (two) times daily.    . naproxen (NAPROSYN) 500 MG tablet Take 500 mg by mouth 2 (two) times daily as needed (for pain).     Marland Kitchen NEEDLE, DISP, 30 G 30G X 3/4" MISC Use as directed, daily blood sugars ( please dispense what patient had previously) 100 each 3  . omeprazole (PRILOSEC) 40 MG capsule Take 1 capsule (40 mg total) by mouth daily. 30 capsule 3  . ondansetron (ZOFRAN) 4 MG tablet Take 1 tablet (4 mg total) by mouth every 8 (eight) hours as needed for nausea or vomiting. 20 tablet 0  . ondansetron (ZOFRAN-ODT) 8 MG disintegrating tablet Take 0.5-1 tablets (4-8 mg total) by mouth every 8 (eight) hours as needed for nausea. 90 tablet 0  . ONETOUCH VERIO test strip   12  . pravastatin (PRAVACHOL) 40 MG tablet Take 40 mg by mouth daily.    . pregabalin (LYRICA) 75 MG capsule Take 75 mg by mouth every morning.     . sertraline (ZOLOFT) 25 MG  tablet Take 25 mg by mouth daily.    Marland Kitchen topiramate (TOPAMAX) 50 MG tablet Take 1 tablet (50 mg total) by mouth 2 (two) times daily. 180 tablet 3  . TRULICITY 1.5 YQ/6.5HQ SOPN INJECT 1.5 MG WEEKLY FOR DIABETES  11  . OXcarbazepine (TRILEPTAL) 150 MG tablet Take 150 mg by mouth at bedtime.      No current facility-administered medications on file prior to visit.     Past Medical History:  Diagnosis Date  . Anxiety   . Bipolar disorder ([place])   . Closed fracture of proximal end of left humerus with routine healing, subsequent encounter 12/15/2015  . Depression   . Diabetes mellitus   . Fatty liver disease, nonalcoholic   . GERD (gastroesophageal reflux disease)   . High cholesterol   . Hypothyroidism     Past Surgical History:  Procedure Laterality Date  . DILATION AND CURETTAGE OF UTERUS    . ERCP  03/03/2011   Procedure: ENDOSCOPIC RETROGRADE CHOLANGIOPANCREATOGRAPHY (ERCP);  Surgeon: Lafayette Dragon, MD;  Location: [facility] ENDOSCOPY;  Service: Endoscopy;  Laterality: N/A;  . [place]    Social History  Substance Use Topics  . Smoking status: Never Smoker  . Smokeless tobacco: Never Used  . Alcohol use 0.6 oz/week    1 Standard drinks or equivalent per week     Comment: once every couple months    Family History  Problem Relation Age of Onset  . Mental illness Father        Manic Depression  . Alcohol abuse Father   . Stroke Father   . Heart disease Father   . Arthritis Son   . Depression Son   . Cancer Mother 70       peritoneal  . Breast cancer Maternal Grandmother   . Breast cancer Paternal Grandmother     Review of Systems  Constitutional: Negative for chills and fever.  Eyes: Positive for photophobia.  Gastrointestinal: Positive for nausea and vomiting.  Neurological: Positive for dizziness and headaches. Negative for speech change and focal weakness.     OBJECTIVE:  Blood pressure (!) 108/57, pulse 84, temperature 97.9 F (36.6 C),  temperature source Oral, resp. rate 18, height 5' 0.5" (1.537 m), weight 204 lb (92.5 kg), SpO2 98 %.  Physical Exam  Constitutional: She is oriented to person, place, and time and well-developed, well-nourished, and in no distress.  Sitting in room with large  dark sunglasses, mildly uncomfortable.  HENT:  Head: Normocephalic and atraumatic.  Mouth/Throat: Oropharynx is clear and moist. No oropharyngeal exudate.  Eyes: Pupils are equal, round, and reactive to light. EOM are normal. No scleral icterus.  Neck: Neck supple.  Cardiovascular: Normal rate, regular rhythm and normal heart sounds.  Exam reveals no gallop and no friction rub.   No murmur heard. Pulmonary/Chest: Effort normal and breath sounds normal. She has no wheezes. She has no rales.  Musculoskeletal: She exhibits no edema.  Neurological: She is alert and oriented to person, place, and time. Gait normal.  Skin: Skin is warm and dry.      ASSESSMENT and PLAN:  1. Migraine without aura and without status migrainosus, not intractable Treating acute migraine with toradol and phenegran as she has done well with this in the past. Refilling home phenegran. Routine home care discussed. Work excuse given. - promethazine (PHENERGAN) 25 MG tablet; Take 1 tablet (25 mg total) by mouth every 8 (eight) hours as needed for nausea or vomiting.  Dispense: 20 tablet; Refill: 0 - ketorolac (TORADOL) injection 60 mg; Inject 2 mLs (60 mg total) into the muscle once. - promethazine (PHENERGAN) injection 25 mg; Inject 1 mL (25 mg total) into the muscle once.       Rutherford Guys, MD Primary Care at [place], [place] [address] Ph.  [phone] Fax [phone]

## 2016-10-24 ENCOUNTER — Encounter: Payer: Self-pay | Admitting: Family Medicine

## 2016-10-24 ENCOUNTER — Ambulatory Visit (INDEPENDENT_AMBULATORY_CARE_PROVIDER_SITE_OTHER): Payer: BC Managed Care – PPO | Admitting: Family Medicine

## 2016-10-24 VITALS — BP 112/84 | HR 90 | Temp 98.5°F | Resp 18 | Ht 60.5 in | Wt 199.6 lb

## 2016-10-24 DIAGNOSIS — K529 Noninfective gastroenteritis and colitis, unspecified: Secondary | ICD-10-CM | POA: Diagnosis not present

## 2016-10-24 NOTE — Patient Instructions (Addendum)
IF you received an x-ray today, you will receive an invoice from [facility]. Please contact [facility] at [phone] with questions or concerns regarding your invoice.   IF you received labwork today, you will receive an invoice from [facility]. Please contact [facility] at [phone] with questions or concerns regarding your invoice.   Our billing staff will not be able to assist you with questions regarding bills from these companies.  You will be contacted with the lab results as soon as they are available. The fastest way to get your results is to activate your My Chart account. Instructions are located on the last page of this paperwork. If you have not heard from [place] regarding the results in 2 weeks, please contact this office.     Nausea and Vomiting, Adult Nausea is the feeling that you have an upset stomach or have to vomit. As nausea gets worse, it can lead to vomiting. Vomiting occurs when stomach contents are thrown up and out of the mouth. Vomiting can make you feel weak and cause you to become dehydrated. Dehydration can make you tired and thirsty, cause you to have a dry mouth, and decrease how often you urinate. Older adults and people with other diseases or a weak immune system are at higher risk for dehydration. It is important to treat your nausea and vomiting as told by your health care provider. Follow these instructions at home: Follow instructions from your health care provider about how to care for yourself at home. Eating and drinking Follow these recommendations as told by your health care provider:  Take an oral rehydration solution (ORS). This is a drink that is sold at pharmacies and retail stores.  Drink clear fluids in small amounts as you are able. Clear fluids include water, ice chips, diluted fruit juice, and low-calorie sports drinks.  Eat bland, easy-to-digest foods in small amounts as you are able. These foods include bananas,  applesauce, rice, lean meats, toast, and crackers.  Avoid fluids that contain a lot of sugar or caffeine, such as energy drinks, sports drinks, and soda.  Avoid alcohol.  Avoid spicy or fatty foods.  General instructions  Drink enough fluid to keep your urine clear or pale yellow.  Wash your hands often. If soap and water are not available, use hand sanitizer.  Make sure that all people in your household wash their hands well and often.  Take over-the-counter and prescription medicines only as told by your health care provider.  Rest at home while you recover.  Watch your condition for any changes.  Breathe slowly and deeply when you feel nauseated.  Keep all follow-up visits as told by your health care provider. This is important. Contact a health care provider if:  You have a fever.  You cannot keep fluids down.  Your symptoms get worse.  You have new symptoms.  Your nausea does not go away after two days.  You feel light-headed or dizzy.  You have a headache.  You have muscle cramps. Get help right away if:  You have pain in your chest, neck, arm, or jaw.  You feel extremely weak or you faint.  You have persistent vomiting.  You see blood in your vomit.  Your vomit looks like black coffee grounds.  You have bloody or black stools or stools that look like tar.  You have a severe headache, a stiff neck, or both.  You have a rash.  You have severe pain, cramping, or bloating  in your abdomen.  You have trouble breathing or you are breathing very quickly.  Your heart is beating very quickly.  Your skin feels cold and clammy.  You feel confused.  You have pain when you urinate.  You have signs of dehydration, such as: ? Dark urine, very little urine, or no urine. ? Cracked lips. ? Dry mouth. ? Sunken eyes. ? Sleepiness. ? Weakness. These symptoms may represent a serious problem that is an emergency. Do not wait to see if the symptoms will go  away. Get medical help right away. Call your local emergency services (911 in the [place] Do not drive yourself to the hospital. This information is not intended to replace advice given to you by your health care provider. Make sure you discuss any questions you have with your health care provider. Document Released: 02/05/2005 Document Revised: 07/11/2015 Document Reviewed: 10/12/2014 Elsevier Interactive Patient Education  2017 [employer].

## 2016-10-24 NOTE — Progress Notes (Signed)
9/5/20185:51 PM  Michelle Torres [birth year], 56 y.o. female [id number]  Chief Complaint  Patient presents with  . Nausea    started yesterday   . Emesis    twice yesterday   . Abdominal Pain    with cramping     HPI:   Patient is a 56 y.o. female who presents today for 2 days of diffuse cramping abd pain, nausea and vomiting. Better with zofran and phenegran. Tolerating fluids. No diarrhea, no fever or chills. Mild URI symptoms of cough and nasal congestion. Needs excuse for work.     Allergies  Allergen Reactions  . Mestinon [Pyridostigmine Bromide] Nausea And Vomiting    "violently ill"  . Risperidone And Related Other (See Comments)    liver damage    Current Outpatient Prescriptions on File Prior to Visit  Medication Sig Dispense Refill  . alosetron (LOTRONEX) 0.5 MG tablet Take 0.5 mg by mouth 2 (two) times daily.    Marland Kitchen azaTHIOprine (IMURAN) 50 MG tablet Take 50 mg by mouth 3 (three) times daily.     Marland Kitchen azelastine (ASTELIN) 0.1 % nasal spray Place 2 sprays into both nostrils 2 (two) times daily. Use in each nostril as directed 30 mL 0  . buPROPion (WELLBUTRIN XL) 150 MG 24 hr tablet Take 150 mg by mouth daily.    . calcium carbonate 1250 MG capsule Take 1,000 mg by mouth daily.    . cetirizine (ZYRTEC) 10 MG tablet Take 10 mg by mouth daily.    . cholecalciferol (VITAMIN D) 1000 UNITS tablet Take 5,000 Units by mouth daily.    . clonazePAM (KLONOPIN) 1 MG tablet Take 3 mg by mouth at bedtime. May take 1/2 to 1 tablet during the day as needed     . divalproex (DEPAKOTE ER) 500 MG 24 hr tablet Take 1,500 mg by mouth at bedtime.     Marland Kitchen eletriptan (RELPAX) 40 MG tablet Take 1 tablet (40 mg total) by mouth as needed for migraine. One tablet by mouth at onset of headache. May repeat in 2 hours if headache persists or recurs. 10 tablet 3  . fluticasone (FLONASE) 50 MCG/ACT nasal spray Place 2 sprays into the nose as needed for rhinitis.    Marland Kitchen gabapentin (NEURONTIN) 600 MG tablet  Take 600 mg by mouth at bedtime.      . insulin degludec (TRESIBA) 100 UNIT/ML SOPN FlexTouch Pen Inject 50 Units into the skin daily at 10 pm.     . Insulin Lispro (HUMALOG KWIKPEN) 200 UNIT/ML SOPN Inject into the skin.    Marland Kitchen lamoTRIgine (LAMICTAL) 100 MG tablet Take 200 mg by mouth every morning.     Marland Kitchen levothyroxine (SYNTHROID, LEVOTHROID) 75 MCG tablet Take 75 mcg by mouth daily before breakfast.    . Melatonin 10 MG CAPS Take by mouth.    . metFORMIN (GLUCOPHAGE-XR) 500 MG 24 hr tablet Take 1,000 mg by mouth 2 (two) times daily.    . naproxen (NAPROSYN) 500 MG tablet Take 500 mg by mouth 2 (two) times daily as needed (for pain).     Marland Kitchen NEEDLE, DISP, 30 G 30G X 3/4" MISC Use as directed, daily blood sugars ( please dispense what patient had previously) 100 each 3  . omeprazole (PRILOSEC) 40 MG capsule Take 1 capsule (40 mg total) by mouth daily. 30 capsule 3  . ondansetron (ZOFRAN) 4 MG tablet Take 1 tablet (4 mg total) by mouth every 8 (eight) hours as needed for nausea or vomiting. [place]  tablet 0  . ondansetron (ZOFRAN-ODT) 8 MG disintegrating tablet Take 0.5-1 tablets (4-8 mg total) by mouth every 8 (eight) hours as needed for nausea. 90 tablet 0  . ONETOUCH VERIO test strip   12  . OXcarbazepine (TRILEPTAL) 150 MG tablet Take 150 mg by mouth at bedtime.     . pravastatin (PRAVACHOL) 40 MG tablet Take 40 mg by mouth daily.    . pregabalin (LYRICA) 75 MG capsule Take 75 mg by mouth every morning.     . promethazine (PHENERGAN) 25 MG tablet Take 1 tablet (25 mg total) by mouth every 8 (eight) hours as needed for nausea or vomiting. 20 tablet 0  . sertraline (ZOLOFT) 25 MG tablet Take 25 mg by mouth daily.    Marland Kitchen topiramate (TOPAMAX) 50 MG tablet Take 1 tablet (50 mg total) by mouth 2 (two) times daily. 180 tablet 3  . TRULICITY 1.5 SE/8.3TD SOPN INJECT 1.5 MG WEEKLY FOR DIABETES  11   No current facility-administered medications on file prior to visit.     Past Medical History:  Diagnosis Date   . Anxiety   . Bipolar disorder ([place])   . Closed fracture of proximal end of left humerus with routine healing, subsequent encounter 12/15/2015  . Depression   . Diabetes mellitus   . Fatty liver disease, nonalcoholic   . GERD (gastroesophageal reflux disease)   . High cholesterol   . Hypothyroidism     Past Surgical History:  Procedure Laterality Date  . DILATION AND CURETTAGE OF UTERUS    . ERCP  03/03/2011   Procedure: ENDOSCOPIC RETROGRADE CHOLANGIOPANCREATOGRAPHY (ERCP);  Surgeon: Lafayette Dragon, MD;  Location: [facility] ENDOSCOPY;  Service: Endoscopy;  Laterality: N/A;  . [place]    Social History  Substance Use Topics  . Smoking status: Never Smoker  . Smokeless tobacco: Never Used  . Alcohol use 0.6 oz/week    1 Standard drinks or equivalent per week     Comment: once every couple months    Family History  Problem Relation Age of Onset  . Mental illness Father        Manic Depression  . Alcohol abuse Father   . Stroke Father   . Heart disease Father   . Arthritis Son   . Depression Son   . Cancer Mother 64       peritoneal  . Breast cancer Maternal Grandmother   . Breast cancer Paternal Grandmother     Review of Systems  Constitutional: Negative for chills and fever.  HENT: Positive for congestion. Negative for ear pain and sore throat.   Respiratory: Positive for cough. Negative for sputum production and shortness of breath.   Cardiovascular: Negative for chest pain, palpitations and leg swelling.  Gastrointestinal: Positive for abdominal pain, nausea and vomiting. Negative for constipation and diarrhea.  Genitourinary: Negative for dysuria and hematuria.     OBJECTIVE:  Blood pressure 112/84, pulse 90, temperature 98.5 F (36.9 C), temperature source Oral, resp. rate 18, height 5' 0.5" (1.537 m), weight 199 lb 9.6 oz (90.5 kg), SpO2 97 %.  Physical Exam  Constitutional: She is oriented to person, place, and time and  well-developed, well-nourished, and in no distress.  HENT:  Head: Normocephalic and atraumatic.  Right Ear: Hearing, tympanic membrane, external ear and ear canal normal.  Left Ear: Hearing, tympanic membrane, external ear and ear canal normal.  Mouth/Throat: Oropharynx is clear and moist.  Eyes: Pupils are equal, round, and reactive to light.  EOM are normal.  Neck: Neck supple.  Cardiovascular: Normal rate, regular rhythm and normal heart sounds.  Exam reveals no gallop and no friction rub.   No murmur heard. Pulmonary/Chest: Effort normal and breath sounds normal. She has no wheezes. She has no rales.  Abdominal: Soft. Bowel sounds are normal. She exhibits no distension and no mass. There is tenderness (mild, diffuse). There is no rebound and no guarding.  Lymphadenopathy:    She has no cervical adenopathy.  Neurological: She is alert and oriented to person, place, and time. Gait normal.  Skin: Skin is warm and dry.       ASSESSMENT and PLAN:  1. Gastroenteritis, acute Discussed supportive care, patient educational handout given. RTC precautions discussed. Work excuse given.       Rutherford Guys, MD Primary Care at [place], [place] [address] Ph.  [phone] Fax [phone]

## 2017-01-21 ENCOUNTER — Ambulatory Visit (INDEPENDENT_AMBULATORY_CARE_PROVIDER_SITE_OTHER): Payer: BC Managed Care – PPO

## 2017-01-21 ENCOUNTER — Ambulatory Visit: Payer: BC Managed Care – PPO | Admitting: Physician Assistant

## 2017-01-21 ENCOUNTER — Other Ambulatory Visit: Payer: Self-pay

## 2017-01-21 ENCOUNTER — Ambulatory Visit: Payer: BC Managed Care – PPO

## 2017-01-21 DIAGNOSIS — M542 Cervicalgia: Secondary | ICD-10-CM

## 2017-01-21 DIAGNOSIS — M25551 Pain in right hip: Secondary | ICD-10-CM | POA: Diagnosis not present

## 2017-01-21 DIAGNOSIS — S20221A Contusion of right back wall of thorax, initial encounter: Secondary | ICD-10-CM

## 2017-01-21 DIAGNOSIS — R220 Localized swelling, mass and lump, head: Secondary | ICD-10-CM | POA: Diagnosis not present

## 2017-01-21 DIAGNOSIS — W19XXXA Unspecified fall, initial encounter: Secondary | ICD-10-CM

## 2017-01-21 DIAGNOSIS — S9001XA Contusion of right ankle, initial encounter: Secondary | ICD-10-CM | POA: Diagnosis not present

## 2017-01-21 DIAGNOSIS — S0510XA Contusion of eyeball and orbital tissues, unspecified eye, initial encounter: Secondary | ICD-10-CM

## 2017-01-21 DIAGNOSIS — R6889 Other general symptoms and signs: Secondary | ICD-10-CM | POA: Diagnosis not present

## 2017-01-21 DIAGNOSIS — S7001XA Contusion of right hip, initial encounter: Secondary | ICD-10-CM | POA: Diagnosis not present

## 2017-01-21 DIAGNOSIS — K529 Noninfective gastroenteritis and colitis, unspecified: Secondary | ICD-10-CM

## 2017-01-21 DIAGNOSIS — M549 Dorsalgia, unspecified: Secondary | ICD-10-CM

## 2017-01-21 LAB — POCT CBC
GRANULOCYTE PERCENT: 60 % (ref 37–80)
HCT, POC: 46.4 % (ref 37.7–47.9)
Hemoglobin: 15.2 g/dL (ref 12.2–16.2)
Lymph, poc: 2.3 (ref 0.6–3.4)
MCH: 28.7 pg (ref 27–31.2)
MCHC: 32.7 g/dL (ref 31.8–35.4)
MCV: 87.7 fL (ref 80–97)
MID (CBC): 0.7 (ref 0–0.9)
MPV: 6.7 fL (ref 0–99.8)
POC Granulocyte: 4.4 (ref 2–6.9)
POC LYMPH PERCENT: 31.1 %L (ref 10–50)
POC MID %: 8.9 % (ref 0–12)
Platelet Count, POC: 207 10*3/uL (ref 142–424)
RBC: 5.29 M/uL (ref 4.04–5.48)
RDW, POC: 15.1 %
WBC: 7.4 10*3/uL (ref 4.6–10.2)

## 2017-01-21 LAB — POC INFLUENZA A&B (BINAX/QUICKVUE)
Influenza A, POC: NEGATIVE
Influenza B, POC: NEGATIVE

## 2017-01-21 LAB — GLUCOSE, POCT (MANUAL RESULT ENTRY): POC GLUCOSE: 114 mg/dL — AB (ref 70–99)

## 2017-01-21 MED ORDER — PROMETHAZINE HCL 12.5 MG RE SUPP: 12.5000 mg | Freq: Four times a day (QID) | RECTAL | 0 refills | Status: DC | PRN

## 2017-01-21 NOTE — Patient Instructions (Addendum)
These xrays are looking normal and negative.  I will contact you regarding the pelvis. I would like you to ice the area three times per day for 15 minutes.   I would like you to take tylenol for pain.  You can take mucinex and tylenol for throat pain and congestion.  Make sure you are hydrating well with water.  This appears to be a viral respiratory illness and may take 7-10 days to run its course.  The best treatment for these are rest and hydration.    Contusion A contusion is a deep bruise. Contusions happen when an injury causes bleeding under the skin. Symptoms of bruising include pain, swelling, and discolored skin. The skin may turn blue, purple, or yellow. Follow these instructions at home:  Rest the injured area.  If told, put ice on the injured area. ? Put ice in a plastic bag. ? Place a towel between your skin and the bag. ? Leave the ice on for 20 minutes, 2-3 times per day.  If told, put light pressure (compression) on the injured area using an elastic bandage. Make sure the bandage is not too tight. Remove it and put it back on as told by your doctor.  If possible, raise (elevate) the injured area above the level of your heart while you are sitting or lying down.  Take over-the-counter and prescription medicines only as told by your doctor. Contact a doctor if:  Your symptoms do not get better after several days of treatment.  Your symptoms get worse.  You have trouble moving the injured area. Get help right away if:  You have very bad pain.  You have a loss of feeling (numbness) in a hand or foot.  Your hand or foot turns pale or cold. This information is not intended to replace advice given to you by your health care provider. Make sure you discuss any questions you have with your health care provider. Document Released: 07/25/2007 Document Revised: 07/14/2015 Document Reviewed: 06/23/2014 Elsevier Interactive Patient Education  2018 [employer].     IF  you received an x-ray today, you will receive an invoice from [facility]. Please contact [facility] at [phone] with questions or concerns regarding your invoice.   IF you received labwork today, you will receive an invoice from [facility]. Please contact [facility] at [phone] with questions or concerns regarding your invoice.   Our billing staff will not be able to assist you with questions regarding bills from these companies.  You will be contacted with the lab results as soon as they are available. The fastest way to get your results is to activate your My Chart account. Instructions are located on the last page of this paperwork. If you have not heard from [place] regarding the results in 2 weeks, please contact this office.

## 2017-01-21 NOTE — Progress Notes (Signed)
PRIMARY CARE AT [name] [address], [place] [address] [phone]  Date:  01/21/2017   Name:  Michelle Torres   DOB:  04-Dec-1960   MRN:  [id number]  PCP:  Harrison Mons, PA-C    History of Present Illness:  Michelle Torres is a 56 y.o. female patient who presents to PCP with  Chief Complaint  Patient presents with  . Fall    today/ right side  . Sore Throat    x yesterday  . Sinusitis    x yesterday  . Chills     She was eating yesterday, developed emesis in several episodes.   She has had mild sore throat and congestion started yesterday.  She is not having any coughing, no ear discomfort.   She was walking and took 2 steps back Right side of hip, knee and foot pain.    She is alternating ice and heat.   She did get her flu vaccine 09/2016 this year.   No bloody or black stool.      Patient Active Problem List   Diagnosis Date Noted  . Nausea and vomiting 08/28/2016  . GERD (gastroesophageal reflux disease) 02/15/2014  . IBS (irritable bowel syndrome) 02/15/2014  . Osteopenia 02/08/2014  . 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 Vitamin D deficiency 08/27/2013  . Hypothyroidism 08/27/2013  . Migraine 08/14/2013  . Bipolar 2 disorder ([place]) 03/05/2011  . Diabetes mellitus ([place]) 03/05/2011  . Fatty infiltration of liver 03/02/2011    Past Medical History:  Diagnosis Date  . Anxiety   . Bipolar disorder ([place])   . Closed fracture of proximal end of left humerus with routine healing, subsequent encounter 12/15/2015  . Depression   . Diabetes mellitus   . Fatty liver disease, nonalcoholic   . GERD (gastroesophageal reflux disease)   . High cholesterol   . Hypothyroidism     Past Surgical History:  Procedure Laterality Date  . DILATION AND CURETTAGE OF UTERUS    . ERCP  03/03/2011   Procedure: ENDOSCOPIC RETROGRADE CHOLANGIOPANCREATOGRAPHY (ERCP);  Surgeon: Lafayette Dragon, MD;  Location: [facility] ENDOSCOPY;  Service: Endoscopy;  Laterality: N/A;  .  [place] History   Tobacco Use  . Smoking status: Never Smoker  . Smokeless tobacco: Never Used  Substance Use Topics  . Alcohol use: Yes    Alcohol/week: 0.6 oz    Types: 1 Standard drinks or equivalent per week    Comment: once every couple months  . Drug use: No    Family History  Problem Relation Age of Onset  . Mental illness Father        Manic Depression  . Alcohol abuse Father   . Stroke Father   . Heart disease Father   . Arthritis Son   . Depression Son   . Cancer Mother 59       peritoneal  . Breast cancer Maternal Grandmother   . Breast cancer Paternal Grandmother     Allergies  Allergen Reactions  . Mestinon [Pyridostigmine Bromide] Nausea And Vomiting    "violently ill"  . Risperidone And Related Other (See Comments)    liver damage    Medication list has been reviewed and updated.  Current Outpatient Medications on File Prior to Visit  Medication Sig Dispense Refill  . alosetron (LOTRONEX) 0.5 MG tablet Take 0.5 mg by mouth 2 (two) times daily.    Marland Kitchen azaTHIOprine (IMURAN) 50 MG tablet Take 50 mg by mouth  3 (three) times daily.     Marland Kitchen azelastine (ASTELIN) 0.1 % nasal spray Place 2 sprays into both nostrils 2 (two) times daily. Use in each nostril as directed 30 mL 0  . buPROPion (WELLBUTRIN XL) 150 MG 24 hr tablet Take 150 mg by mouth daily.    . calcium carbonate 1250 MG capsule Take 1,000 mg by mouth daily.    . cetirizine (ZYRTEC) 10 MG tablet Take 10 mg by mouth daily.    . cholecalciferol (VITAMIN D) 1000 UNITS tablet Take 5,000 Units by mouth daily.    . clonazePAM (KLONOPIN) 1 MG tablet Take 3 mg by mouth at bedtime. May take 1/2 to 1 tablet during the day as needed     . divalproex (DEPAKOTE ER) 500 MG 24 hr tablet Take 1,500 mg by mouth at bedtime.     Marland Kitchen eletriptan (RELPAX) 40 MG tablet Take 1 tablet (40 mg total) by mouth as needed for migraine. One tablet by mouth at onset of headache. May repeat in 2 hours  if headache persists or recurs. 10 tablet 3  . empagliflozin (JARDIANCE) 10 MG TABS tablet Take 10 mg by mouth daily.    . fluticasone (FLONASE) 50 MCG/ACT nasal spray Place 2 sprays into the nose as needed for rhinitis.    Marland Kitchen gabapentin (NEURONTIN) 600 MG tablet Take 600 mg by mouth at bedtime.      . insulin degludec (TRESIBA) 100 UNIT/ML SOPN FlexTouch Pen Inject 50 Units into the skin daily at 10 pm.     . Insulin Lispro (HUMALOG KWIKPEN) 200 UNIT/ML SOPN Inject into the skin.    Marland Kitchen lamoTRIgine (LAMICTAL) 100 MG tablet Take 200 mg by mouth every morning.     Marland Kitchen levothyroxine (SYNTHROID, LEVOTHROID) 75 MCG tablet Take 75 mcg by mouth daily before breakfast.    . loperamide (IMODIUM) 2 MG capsule Take by mouth as needed for diarrhea or loose stools.    . Melatonin 10 MG CAPS Take by mouth.    . metFORMIN (GLUCOPHAGE-XR) 500 MG 24 hr tablet Take 1,000 mg by mouth 2 (two) times daily.    . naproxen (NAPROSYN) 500 MG tablet Take 500 mg by mouth 2 (two) times daily as needed (for pain).     Marland Kitchen NEEDLE, DISP, 30 G 30G X 3/4" MISC Use as directed, daily blood sugars ( please dispense what patient had previously) 100 each 3  . omeprazole (PRILOSEC) 40 MG capsule Take 1 capsule (40 mg total) by mouth daily. 30 capsule 3  . ondansetron (ZOFRAN) 4 MG tablet Take 1 tablet (4 mg total) by mouth every 8 (eight) hours as needed for nausea or vomiting. 20 tablet 0  . ondansetron (ZOFRAN-ODT) 8 MG disintegrating tablet Take 0.5-1 tablets (4-8 mg total) by mouth every 8 (eight) hours as needed for nausea. 90 tablet 0  . ONETOUCH VERIO test strip   12  . pravastatin (PRAVACHOL) 40 MG tablet Take 40 mg by mouth daily.    . pregabalin (LYRICA) 75 MG capsule Take 75 mg by mouth every morning.     . promethazine (PHENERGAN) 12.5 MG suppository Place 12.5 mg rectally every 6 (six) hours as needed for nausea or vomiting.    . promethazine (PHENERGAN) 25 MG tablet Take 1 tablet (25 mg total) by mouth every 8 (eight) hours  as needed for nausea or vomiting. 20 tablet 0  . sertraline (ZOLOFT) 25 MG tablet Take 25 mg by mouth daily.    Marland Kitchen topiramate (TOPAMAX) 50  MG tablet Take 1 tablet (50 mg total) by mouth 2 (two) times daily. 180 tablet 3  . TRULICITY 1.5 WV/3.7TG SOPN INJECT 1.5 MG WEEKLY FOR DIABETES  11  . OXcarbazepine (TRILEPTAL) 150 MG tablet Take 150 mg by mouth at bedtime.      No current facility-administered medications on file prior to visit.     ROS ROS otherwise unremarkable unless listed above.  Physical Examination: There were no vitals taken for this visit. Ideal Body Weight:    Physical Exam  Constitutional: She is oriented to person, place, and time. She appears well-developed and well-nourished. No distress.  HENT:  Head: Normocephalic and atraumatic.  Right Ear: Tympanic membrane, external ear and ear canal normal.  Left Ear: Tympanic membrane, external ear and ear canal normal.  Nose: Mucosal edema present. No rhinorrhea. Right sinus exhibits no maxillary sinus tenderness and no frontal sinus tenderness. Left sinus exhibits no maxillary sinus tenderness and no frontal sinus tenderness.  Mouth/Throat: No uvula swelling. No oropharyngeal exudate, posterior oropharyngeal edema or posterior oropharyngeal erythema.  Eyes: Conjunctivae and EOM are normal. Pupils are equal, round, and reactive to light.  Cardiovascular: Normal rate and regular rhythm. Exam reveals no gallop, no distant heart sounds and no friction rub.  No murmur heard. Pulmonary/Chest: Effort normal. No respiratory distress. She has no decreased breath sounds. She has no wheezes. She has no rhonchi.  Musculoskeletal:  Tender along the trochanter,  Medial malleolus of the right side.  Tender along just proximal along the tibia.   Right side of posterior trunk with abrasions midback with tenderness.  No crepitus.     Lymphadenopathy:       Head (right side): No submandibular, no tonsillar, no preauricular and no posterior  auricular adenopathy present.       Head (left side): No submandibular, no tonsillar, no preauricular and no posterior auricular adenopathy present.  Neurological: She is alert and oriented to person, place, and time.  Skin: She is not diaphoretic.  Psychiatric: She has a normal mood and affect. Her behavior is normal.    Results for orders placed or performed in visit on 01/21/17  POC Influenza A&B(BINAX/QUICKVUE)  Result Value Ref Range   Influenza A, POC Negative Negative   Influenza B, POC Negative Negative   Dg Ribs Unilateral W/chest Right  Result Date: 01/21/2017 CLINICAL DATA:  Recent fall with right-sided rib pain EXAM: RIGHT RIBS AND CHEST - 3+ VIEW COMPARISON:  None. FINDINGS: Cardiac shadow is within normal limits. The lungs are well aerated bilaterally. No focal infiltrate, effusion or pneumothorax is seen. Old proximal left humeral fracture is seen. The rib cage is well visualized and shows no definitive rib fractures. No other focal abnormality is seen. IMPRESSION: No definitive rib fracture is noted. Electronically Signed   By: Inez Catalina M.D.   On: 01/21/2017 16:42   Dg Elbow Complete Right  Result Date: 01/21/2017 CLINICAL DATA:  Injury with tenderness along the lateral elbow. Initial encounter. EXAM: RIGHT ELBOW - COMPLETE 3+ VIEW COMPARISON:  None. FINDINGS: There is no evidence of fracture, dislocation, or joint effusion. There is no evidence of arthropathy or other focal bone abnormality. Soft tissues are unremarkable. IMPRESSION: Negative. Electronically Signed   By: Monte Fantasia M.D.   On: 01/21/2017 16:40   Dg Tibia/fibula Right  Result Date: 01/21/2017 CLINICAL DATA:  Fall.  Fibula pain. EXAM: RIGHT TIBIA AND FIBULA - 2 VIEW COMPARISON:  None. FINDINGS: There is no evidence of fracture or other focal  bone lesions. Soft tissues are unremarkable. IMPRESSION: Negative. Electronically Signed   By: Misty Stanley M.D.   On: 01/21/2017 16:41   Dg Ankle Complete  Right  Result Date: 01/21/2017 CLINICAL DATA:  Fall.  Lateral malleolus tenderness. EXAM: RIGHT ANKLE - COMPLETE 3+ VIEW COMPARISON:  None. FINDINGS: There is no evidence of fracture, dislocation, or joint effusion. There is no evidence of arthropathy or other focal bone abnormality. Soft tissues are unremarkable. IMPRESSION: Negative. Electronically Signed   By: Misty Stanley M.D.   On: 01/21/2017 16:40   Dg Hip Unilat W Or W/o Pelvis 2-3 Views Right  Result Date: 01/21/2017 CLINICAL DATA:  Patient fell.  Right hip pain EXAM: DG HIP (WITH OR WITHOUT PELVIS) 2-3V RIGHT COMPARISON:  None. FINDINGS: There is no evidence of hip fracture or dislocation. There is no evidence of arthropathy or other focal bone abnormality. IMPRESSION: Negative. Electronically Signed   By: Misty Stanley M.D.   On: 01/21/2017 17:15      Assessment and Plan: Michelle Torres is a 56 y.o. female who is here today for cc of  Chief Complaint  Patient presents with  . Fall    today/ right side  . Sore Throat    x yesterday  . Sinusitis    x yesterday  . Chills  --likely passing gi virus.   --advised rice at this time --follow up if no improvement.  Flu-like symptoms - Plan: POC Influenza A&B(BINAX/QUICKVUE), POCT glucose (manual entry), POCT CBC  Fall, initial encounter - Plan: DG Ribs Unilateral W/Chest Right, DG Tibia/Fibula Right, DG Ankle Complete Right, DG Elbow Complete Right, CANCELED: DG Ribs Unilateral Right  Right hip pain - Plan: DG HIP UNILAT W OR W/O PELVIS 2-3 VIEWS RIGHT  Ivar Drape, PA-C [facility] 12/6/201812:27 PM

## 2017-01-22 ENCOUNTER — Telehealth: Payer: Self-pay | Admitting: Physician Assistant

## 2017-01-22 NOTE — Telephone Encounter (Signed)
Please advise patient that her hip xray was normal.

## 2017-01-22 NOTE — Telephone Encounter (Signed)
Patient advised.

## 2017-02-25 ENCOUNTER — Other Ambulatory Visit: Payer: Self-pay

## 2017-02-25 ENCOUNTER — Encounter: Payer: Self-pay | Admitting: Family Medicine

## 2017-02-25 ENCOUNTER — Ambulatory Visit: Payer: BC Managed Care – PPO | Admitting: Family Medicine

## 2017-02-25 VITALS — BP 118/72 | HR 85 | Temp 97.6°F | Resp 16 | Ht 60.5 in | Wt 201.8 lb

## 2017-02-25 DIAGNOSIS — G43101 Migraine with aura, not intractable, with status migrainosus: Secondary | ICD-10-CM | POA: Diagnosis not present

## 2017-02-25 DIAGNOSIS — W19XXXS Unspecified fall, sequela: Secondary | ICD-10-CM

## 2017-02-25 DIAGNOSIS — M79604 Pain in right leg: Secondary | ICD-10-CM

## 2017-02-25 DIAGNOSIS — S20229S Contusion of unspecified back wall of thorax, sequela: Secondary | ICD-10-CM | POA: Diagnosis not present

## 2017-02-25 MED ORDER — KETOROLAC TROMETHAMINE 60 MG/2ML IM SOLN
60.0000 mg | Freq: Once | INTRAMUSCULAR | Status: AC
  Administered 2017-02-25: 60 mg via INTRAMUSCULAR

## 2017-02-25 MED ORDER — PROMETHAZINE HCL 25 MG/ML IJ SOLN
25.0000 mg | Freq: Once | INTRAMUSCULAR | Status: AC
  Administered 2017-02-25: 25 mg via INTRAMUSCULAR

## 2017-02-25 NOTE — Patient Instructions (Addendum)
If no improvement in symptoms within a week or two or worsening at all, come back in for xray imaging of your low back and pelvis as well as a dedicated neurologic/musculoskeletal exam.    IF you received an x-ray today, you will receive an invoice from [facility]. Please contact [facility] at [phone] with questions or concerns regarding your invoice.   IF you received labwork today, you will receive an invoice from [place]. Please contact [facility] at [phone] with questions or concerns regarding your invoice.   Our billing staff will not be able to assist you with questions regarding bills from these companies.  You will be contacted with the lab results as soon as they are available. The fastest way to get your results is to activate your My Chart account. Instructions are located on the last page of this paperwork. If you have not heard from [place] regarding the results in 2 weeks, please contact this office.      Piriformis Syndrome Rehab Ask your health care provider which exercises are safe for you. Do exercises exactly as told by your health care provider and adjust them as directed. It is normal to feel mild stretching, pulling, tightness, or discomfort as you do these exercises, but you should stop right away if you feel sudden pain or your pain gets worse.Do not begin these exercises until told by your health care provider. Stretching and range of motion exercises These exercises warm up your muscles and joints and improve the movement and flexibility of your hip and pelvis. These exercises also help to relieve pain, numbness, and tingling. Exercise A: Hip rotators  1. Lie on your back on a firm surface. 2. Pull your left / right knee toward your same shoulder with your left / right hand until your knee is pointing toward the ceiling. Hold your left / right ankle with your other hand. 3. Keeping your knee steady, gently pull your left / right ankle toward  your other shoulder until you feel a stretch in your buttocks. 4. Hold this position for __________ seconds. Repeat __________ times. Complete this stretch __________ times a day. Exercise B: Hip extensors 1. Lie on your back on a firm surface. Both of your legs should be straight. 2. Pull your left / right knee to your chest. Hold your leg in this position by holding onto the back of your thigh or the front of your knee. 3. Hold this position for __________ seconds. 4. Slowly return to the starting position. Repeat __________ times. Complete this stretch __________ times a day. Strengthening exercises These exercises build strength and endurance in your hip and thigh muscles. Endurance is the ability to use your muscles for a long time, even after they get tired. Exercise C: Straight leg raises ( hip abductors) 1. Lie on your side with your left / right leg in the top position. Lie so your head, shoulder, knee, and hip line up. Bend your bottom knee to help you balance. 2. Lift your top leg up 4-6 inches (10-15 cm), keeping your toes pointed straight ahead. 3. Hold this position for __________ seconds. 4. Slowly lower your leg to the starting position. Let your muscles relax completely. Repeat __________ times. Complete this exercise__________ times a day. Exercise D: Hip abductors and rotators, quadruped  1. Get on your hands and knees on a firm, lightly padded surface. Your hands should be directly below your shoulders, and your knees should be directly below your hips. 2. Lift your  left / right knee out to the side. Keep your knee bent. Do not twist your body. 3. Hold this position for __________ seconds. 4. Slowly lower your leg. Repeat __________ times. Complete this exercise__________ times a day. Exercise E: Straight leg raises ( hip extensors) 1. Lie on your abdomen on a bed or a firm surface with a pillow under your hips. 2. Squeeze your buttock muscles and lift your left /  right thigh off the bed. Do not let your back arch. 3. Hold this position for __________ seconds. 4. Slowly return to the starting position. Let your muscles relax completely before doing another repetition. Repeat __________ times. Complete this exercise__________ times a day. This information is not intended to replace advice given to you by your health care provider. Make sure you discuss any questions you have with your health care provider. Document Released: 02/05/2005 Document Revised: 10/11/2015 Document Reviewed: 01/18/2015 Elsevier Interactive Patient Education  [employer].

## 2017-02-25 NOTE — Progress Notes (Signed)
Subjective:  By signing my name below, I, Essence Howell, attest that this documentation has been prepared under the direction and in the presence of Delman Cheadle, MD Electronically Signed: Ladene Artist, ED Scribe 02/25/2017 at 1:53 PM.   Patient ID: Michelle Torres, female    DOB: [birth year], 57 y.o.   MRN: [id number]  Chief Complaint  Patient presents with  . Migraine    took relpax at 4 and 8 am, with no relief   HPI Michelle Torres is a 57 y.o. female who presents to Primary Care at [facility] complaining of migraine HA. Pt states that this migraine is milder than others. She reports less nausea, hoarseness and visual disturbances such as "halos" with this HA. Pt reports a h/o migraines for the past 25 yrs but states she only has a migraine every few months. She took Relpax at 4 AM and again at 8 AM without any relief. She did not take Phenergan since she was afraid she would oversleep for her appointment. Pt has not taken any NSAIDs today.  Pt also presents with pain to the R foot, R shin, R hand and R low back/buttock x 1 week. Denies swelling, weakness. She does report falling on the L side ~4 wks ago.  Past Medical History:  Diagnosis Date  . Anxiety   . Bipolar disorder ([place])   . Closed fracture of proximal end of left humerus with routine healing, subsequent encounter 12/15/2015  . Depression   . Diabetes mellitus   . Fatty liver disease, nonalcoholic   . GERD (gastroesophageal reflux disease)   . High cholesterol   . Hypothyroidism    Current Outpatient Medications on File Prior to Visit  Medication Sig Dispense Refill  . alosetron (LOTRONEX) 0.5 MG tablet Take 0.5 mg by mouth 2 (two) times daily.    Marland Kitchen azaTHIOprine (IMURAN) 50 MG tablet Take 50 mg by mouth 3 (three) times daily.     Marland Kitchen azelastine (ASTELIN) 0.1 % nasal spray Place 2 sprays into both nostrils 2 (two) times daily. Use in each nostril as directed 30 mL 0  . buPROPion (WELLBUTRIN XL) 150 MG 24 hr tablet Take 150 mg by  mouth daily.    . calcium carbonate 1250 MG capsule Take 1,000 mg by mouth daily.    . cetirizine (ZYRTEC) 10 MG tablet Take 10 mg by mouth daily.    . cholecalciferol (VITAMIN D) 1000 UNITS tablet Take 5,000 Units by mouth daily.    . clonazePAM (KLONOPIN) 1 MG tablet Take 3 mg by mouth at bedtime. May take 1/2 to 1 tablet during the day as needed     . divalproex (DEPAKOTE ER) 500 MG 24 hr tablet Take 1,500 mg by mouth at bedtime.     Marland Kitchen eletriptan (RELPAX) 40 MG tablet Take 1 tablet (40 mg total) by mouth as needed for migraine. One tablet by mouth at onset of headache. May repeat in 2 hours if headache persists or recurs. 10 tablet 3  . empagliflozin (JARDIANCE) 10 MG TABS tablet Take 10 mg by mouth daily.    . fluticasone (FLONASE) 50 MCG/ACT nasal spray Place 2 sprays into the nose as needed for rhinitis.    Marland Kitchen gabapentin (NEURONTIN) 600 MG tablet Take 600 mg by mouth at bedtime.      . insulin degludec (TRESIBA) 100 UNIT/ML SOPN FlexTouch Pen Inject 50 Units into the skin daily at 10 pm.     . Insulin Lispro (HUMALOG KWIKPEN) 200 UNIT/ML SOPN Inject into  the skin.    Marland Kitchen lamoTRIgine (LAMICTAL) 100 MG tablet Take 200 mg by mouth every morning.     Marland Kitchen levothyroxine (SYNTHROID, LEVOTHROID) 75 MCG tablet Take 75 mcg by mouth daily before breakfast.    . loperamide (IMODIUM) 2 MG capsule Take by mouth as needed for diarrhea or loose stools.    . Melatonin 10 MG CAPS Take by mouth.    . metFORMIN (GLUCOPHAGE-XR) 500 MG 24 hr tablet Take 1,000 mg by mouth 2 (two) times daily.    . naproxen (NAPROSYN) 500 MG tablet Take 500 mg by mouth 2 (two) times daily as needed (for pain).     Marland Kitchen NEEDLE, DISP, 30 G 30G X 3/4" MISC Use as directed, daily blood sugars ( please dispense what patient had previously) 100 each 3  . omeprazole (PRILOSEC) 40 MG capsule Take 1 capsule (40 mg total) by mouth daily. 30 capsule 3  . ondansetron (ZOFRAN) 4 MG tablet Take 1 tablet (4 mg total) by mouth every 8 (eight) hours as  needed for nausea or vomiting. 20 tablet 0  . ondansetron (ZOFRAN-ODT) 8 MG disintegrating tablet Take 0.5-1 tablets (4-8 mg total) by mouth every 8 (eight) hours as needed for nausea. 90 tablet 0  . ONETOUCH VERIO test strip   12  . OXcarbazepine (TRILEPTAL) 150 MG tablet Take 150 mg by mouth at bedtime.     . pravastatin (PRAVACHOL) 40 MG tablet Take 40 mg by mouth daily.    . pregabalin (LYRICA) 75 MG capsule Take 75 mg by mouth every morning.     . promethazine (PHENERGAN) 12.5 MG suppository Place 1 suppository (12.5 mg total) rectally every 6 (six) hours as needed for nausea or vomiting. 12 each 0  . promethazine (PHENERGAN) 25 MG tablet Take 1 tablet (25 mg total) by mouth every 8 (eight) hours as needed for nausea or vomiting. 20 tablet 0  . sertraline (ZOLOFT) 25 MG tablet Take 25 mg by mouth daily.    Marland Kitchen topiramate (TOPAMAX) 50 MG tablet Take 1 tablet (50 mg total) by mouth 2 (two) times daily. 180 tablet 3  . TRULICITY 1.5 EQ/6.8TM SOPN INJECT 1.5 MG WEEKLY FOR DIABETES  11   No current facility-administered medications on file prior to visit.    Allergies  Allergen Reactions  . Mestinon [Pyridostigmine Bromide] Nausea And Vomiting    "violently ill"  . Risperidone And Related Other (See Comments)    liver damage   Past Surgical History:  Procedure Laterality Date  . DILATION AND CURETTAGE OF UTERUS    . ERCP  03/03/2011   Procedure: ENDOSCOPIC RETROGRADE CHOLANGIOPANCREATOGRAPHY (ERCP);  Surgeon: Lafayette Dragon, MD;  Location: [facility] ENDOSCOPY;  Service: Endoscopy;  Laterality: N/A;  . LAPAROSCOPIC CHOLECYSTECTOMY  1993   Family History  Problem Relation Age of Onset  . Mental illness Father        Manic Depression  . Alcohol abuse Father   . Stroke Father   . Heart disease Father   . Arthritis Son   . Depression Son   . Cancer Mother 16       peritoneal  . Breast cancer Maternal Grandmother   . Breast cancer Paternal Grandmother    Social History   Socioeconomic  History  . Marital status: Married    Spouse name: [name] Sword  . Number of children: 2  . Years of education: 22  . Highest education level: None  Social Needs  . Financial resource strain: None  .  Food insecurity - worry: None  . Food insecurity - inability: None  . Transportation needs - medical: None  . Transportation needs - non-medical: None  Occupational History  . Occupation: PROCESSING ASSISTANT    Employer: [employer]  Tobacco Use  . Smoking status: Never Smoker  . Smokeless tobacco: Never Used  Substance and Sexual Activity  . Alcohol use: Yes    Alcohol/week: 0.6 oz    Types: 1 Standard drinks or equivalent per week    Comment: once every couple months  . Drug use: No  . Sexual activity: Yes    Partners: Male    Birth control/protection: Post-menopausal  Other Topics Concern  . None  Social History Narrative   Lives with her husband.  Their daughter, lives with them intermittently.  Her son is at [facility] and lives with them. Her husband has a child from a previous relationship who lives in [place].   Depression screen [facility] 2/9 02/25/2017 01/21/2017 09/17/2016 09/03/2016 07/24/2016  Decreased Interest 0 0 0 0 1  Down, Depressed, Hopeless 0 0 0 0 2  PHQ - 2 Score 0 0 0 0 3  Altered sleeping - - - - 3  Tired, decreased energy - - - - 2  Change in appetite - - - - 2  Feeling bad or failure about yourself  - - - - 3  Trouble concentrating - - - - 1  Moving slowly or fidgety/restless - - - - 2  Suicidal thoughts - - - - 0  PHQ-9 Score - - - - 16  Difficult doing work/chores - - - - Somewhat difficult    Review of Systems  HENT: Negative for voice change.   Eyes: Positive for photophobia. Negative for visual disturbance.  Gastrointestinal: Positive for nausea (mild).  Musculoskeletal: Positive for back pain and myalgias. Negative for joint swelling.  Neurological: Positive for headaches. Negative for weakness.      Objective:   Physical Exam    Constitutional: She is oriented to person, place, and time. She appears well-developed and well-nourished. No distress.  HENT:  Head: Normocephalic and atraumatic.  Right Ear: Tympanic membrane normal.  Left Ear: Tympanic membrane normal.  Nose: Nose normal.  Mouth/Throat: Oropharynx is clear and moist.  Eyes: Conjunctivae and EOM are normal.  Neck: Neck supple. No tracheal deviation present.  Cardiovascular: Normal rate, regular rhythm and normal heart sounds.  Pulmonary/Chest: Effort normal and breath sounds normal. No respiratory distress.  Musculoskeletal: Normal range of motion.  Neurological: She is alert and oriented to person, place, and time.  Skin: Skin is warm and dry.  Psychiatric: She has a normal mood and affect. Her behavior is normal.  Nursing note and vitals reviewed.  BP 118/72   Pulse 85   Temp 97.6 F (36.4 C)   Resp 16   Ht 5' 0.5" (1.537 m)   Wt 201 lb 12.8 oz (91.5 kg)   SpO2 96%   BMI 38.76 kg/m     Assessment & Plan:   1. Migraine with aura and with status migrainosus, not intractable   2. Lower extremity pain, right   3. Fall, sequela   4. Contusion of mid back, unspecified laterality, sequela      Meds ordered this encounter  Medications  . promethazine (PHENERGAN) injection 25 mg  . ketorolac (TORADOL) injection 60 mg    I personally performed the services described in this documentation, which was scribed in my presence. The recorded information has been reviewed  and considered, and addended by me as needed.   Delman Cheadle, M.D.  Primary Care at [facility] [address] [place], [place] [address] [phone] phone [phone] fax  02/28/17 1:21 PM

## 2017-03-08 ENCOUNTER — Encounter: Payer: Self-pay | Admitting: Physician Assistant

## 2017-03-08 ENCOUNTER — Ambulatory Visit: Payer: BC Managed Care – PPO | Admitting: Physician Assistant

## 2017-03-08 VITALS — BP 126/86 | HR 76 | Temp 98.6°F | Resp 18 | Ht 60.5 in | Wt 200.4 lb

## 2017-03-08 DIAGNOSIS — M79674 Pain in right toe(s): Secondary | ICD-10-CM

## 2017-03-08 DIAGNOSIS — G43101 Migraine with aura, not intractable, with status migrainosus: Secondary | ICD-10-CM | POA: Diagnosis not present

## 2017-03-08 DIAGNOSIS — G43109 Migraine with aura, not intractable, without status migrainosus: Secondary | ICD-10-CM | POA: Diagnosis not present

## 2017-03-08 DIAGNOSIS — R112 Nausea with vomiting, unspecified: Secondary | ICD-10-CM | POA: Diagnosis not present

## 2017-03-08 DIAGNOSIS — Z5181 Encounter for therapeutic drug level monitoring: Secondary | ICD-10-CM

## 2017-03-08 DIAGNOSIS — F3181 Bipolar II disorder: Secondary | ICD-10-CM | POA: Diagnosis not present

## 2017-03-08 MED ORDER — KETOROLAC TROMETHAMINE 60 MG/2ML IM SOLN
60.0000 mg | Freq: Once | INTRAMUSCULAR | Status: AC
  Administered 2017-03-08: 60 mg via INTRAMUSCULAR

## 2017-03-08 MED ORDER — ELETRIPTAN HYDROBROMIDE 40 MG PO TABS: 40.0000 mg | ORAL_TABLET | ORAL | 3 refills | Status: DC | PRN

## 2017-03-08 MED ORDER — ONDANSETRON 8 MG PO TBDP: 4.0000 mg | ORAL_TABLET | Freq: Three times a day (TID) | ORAL | 0 refills | Status: DC | PRN

## 2017-03-08 MED ORDER — PROMETHAZINE HCL 25 MG/ML IJ SOLN
25.0000 mg | Freq: Once | INTRAMUSCULAR | Status: AC
  Administered 2017-03-08: 25 mg via INTRAMUSCULAR

## 2017-03-08 NOTE — Progress Notes (Signed)
Subjective:    Patient ID: Michelle Torres, female    DOB: [birth year], 57 y.o.   MRN: [id number]  Chief Complaint  Patient presents with  . Migraine    Pt states migraine come on this morning. Pt states she hasn't had anything to eat since this morning. Pt states she has tried taking OTC Excerdrin and than tried Zofran and Relpax.  . Toe Pain    x2 months pt states she has a lump on her left big toe that is raised and red and it hurts to wear shoes.  . Medication Refill    Relpax 10 MG, Zofran 8 MG   Patient woke up this morning around 4 am with a mild headache. She took Replax and Zofran. She ate nabs this morning, but has not eaten anything else all day. Has stayed hydrated with lots of water. She feels nauseous and stomach feels bloated. Headache persisted into the afternoon. She took the medications again around 3 pm. Headache did not clear.  Bilateral temples. Light sensitive. Nauseous. No vomiting, but she has been "spitting up." This migraine is a typical occurrence for her.   Glucose this morning 134. Normal around 70.   Denies: neck pain, paresthesias, visual changes, mental status changes, vomiting, diarrhea, numbness, weakness, or dizziness.  Also notes right great toe pain. Started 2 months ago. Hurts when she walks. Switches her shoes regularly.   Review of Systems As above.   Patient Active Problem List   Diagnosis Date Noted  . Nausea and vomiting 08/28/2016  . GERD (gastroesophageal reflux disease) 02/15/2014  . IBS (irritable bowel syndrome) 02/15/2014  . Osteopenia 02/08/2014  . 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 Vitamin D deficiency 08/27/2013  . Hypothyroidism 08/27/2013  . Migraine 08/14/2013  . Bipolar 2 disorder ([place]) 03/05/2011  . Diabetes mellitus ([place]) 03/05/2011  . Fatty infiltration of liver 03/02/2011   Prior to Admission medications   Medication Sig Start Date End Date Taking? Authorizing Provider  alosetron (LOTRONEX) 0.5 MG tablet  Take 0.5 mg by mouth 2 (two) times daily.   Yes [provider]  azaTHIOprine (IMURAN) 50 MG tablet Take 50 mg by mouth 3 (three) times daily.    Yes [provider]  azelastine (ASTELIN) 0.1 % nasal spray Place 2 sprays into both nostrils 2 (two) times daily. Use in each nostril as directed 07/24/16  Yes Jeffery, Chelle, PA-C  buPROPion (WELLBUTRIN XL) 150 MG 24 hr tablet Take 150 mg by mouth daily.   Yes [provider]  calcium carbonate 1250 MG capsule Take 1,000 mg by mouth daily.   Yes [provider]  cetirizine (ZYRTEC) 10 MG tablet Take 10 mg by mouth daily.   Yes [provider]  cholecalciferol (VITAMIN D) 1000 UNITS tablet Take 5,000 Units by mouth daily.   Yes [provider]  clonazePAM (KLONOPIN) 1 MG tablet Take 3 mg by mouth at bedtime. May take 1/2 to 1 tablet during the day as needed    Yes [provider]  divalproex (DEPAKOTE ER) 500 MG 24 hr tablet Take 1,500 mg by mouth at bedtime.    Yes [provider]  eletriptan (RELPAX) 40 MG tablet Take 1 tablet (40 mg total) by mouth as needed for migraine. 1 tab PO at onset of headache Repeat in 2 hours if headache persists or recurs 03/08/17  Yes Jeffery, Chelle, PA-C  empagliflozin (JARDIANCE) 10 MG TABS tablet Take 10 mg by mouth daily.   Yes [provider]  fluticasone (FLONASE) 50 MCG/ACT nasal spray Place 2 sprays into the nose as needed for rhinitis.   Yes [provider]  gabapentin (NEURONTIN) 600 MG tablet Take 600 mg by mouth at bedtime.     Yes [provider]  insulin degludec (TRESIBA) 100 UNIT/ML SOPN FlexTouch Pen Inject 50 Units into the skin daily at 10 pm.    Yes [provider]  Insulin Lispro (HUMALOG KWIKPEN) 200 UNIT/ML SOPN Inject into the skin.   Yes [provider]  lamoTRIgine (LAMICTAL) 100 MG tablet Take 200 mg by mouth every morning.    Yes [provider]  levothyroxine (SYNTHROID,  LEVOTHROID) 75 MCG tablet Take 75 mcg by mouth daily before breakfast.   Yes [provider]  loperamide (IMODIUM) 2 MG capsule Take by mouth as needed for diarrhea or loose stools.   Yes [provider]  Melatonin 10 MG CAPS Take by mouth.   Yes [provider]  metFORMIN (GLUCOPHAGE-XR) 500 MG 24 hr tablet Take 1,000 mg by mouth 2 (two) times daily.   Yes [provider]  naproxen (NAPROSYN) 500 MG tablet Take 500 mg by mouth 2 (two) times daily as needed (for pain).    Yes [provider]  NEEDLE, DISP, 30 G 30G X 3/4" MISC Use as directed, daily blood sugars ( please dispense what patient had previously) 07/14/13  Yes Le, Thao P, DO  omeprazole (PRILOSEC) 40 MG capsule Take 1 capsule (40 mg total) by mouth daily. 02/08/14  Yes Jeffery, Chelle, PA-C  ondansetron (ZOFRAN-ODT) 8 MG disintegrating tablet Take 0.5-1 tablets (4-8 mg total) by mouth every 8 (eight) hours as needed for nausea. 03/08/17  Yes Jeffery, Domingo Mend, PA-C  ONETOUCH VERIO test strip  01/25/14  Yes [provider]  pravastatin (PRAVACHOL) 40 MG tablet Take 40 mg by mouth daily.   Yes [provider]  pregabalin (LYRICA) 75 MG capsule Take 75 mg by mouth every morning.    Yes [provider]  promethazine (PHENERGAN) 12.5 MG suppository Place 1 suppository (12.5 mg total) rectally every 6 (six) hours as needed for nausea or vomiting. 01/21/17  Yes English, Colletta [place] D, PA  promethazine (PHENERGAN) 25 MG tablet Take 1 tablet (25 mg total) by mouth every 8 (eight) hours as needed for nausea or vomiting. 10/16/16  Yes Rutherford Guys, MD  sertraline (ZOLOFT) 25 MG tablet Take 25 mg by mouth daily.   Yes [provider]  topiramate (TOPAMAX) 50 MG tablet Take 1 tablet (50 mg total) by mouth 2 (two) times daily. 03/27/16  Yes Jeffery, Chelle, PA-C  TRULICITY 1.5 JS/9.7WY SOPN INJECT 1.5 MG WEEKLY FOR DIABETES 09/22/14  Yes [provider]  OXcarbazepine  (TRILEPTAL) 150 MG tablet Take 150 mg by mouth at bedtime.     [provider]   Allergies  Allergen Reactions  . Mestinon [Pyridostigmine Bromide] Nausea And Vomiting    "violently ill"  . Risperidone And Related Other (See Comments)    liver damage      Objective:   Physical Exam  Constitutional: She is oriented to person, place, and time. She appears well-developed and well-nourished.  HENT:  Head: Normocephalic.  Eyes: EOM are normal. Pupils are equal, round, and reactive to light.  Neck: No JVD present. No thyromegaly present.  Cardiovascular: Normal rate, regular rhythm, normal heart sounds and intact distal pulses.  Pulmonary/Chest: Effort normal and breath sounds normal.  Musculoskeletal: She exhibits no edema.  Right foot: There is tenderness (right MCP joint. pain with flexion and extrension. No warmth or edema).  Lymphadenopathy:    She has no cervical adenopathy.  Neurological: She is alert and oriented to person, place, and time. She has normal reflexes. She displays no tremor. No sensory deficit. Coordination and gait normal.  Psychiatric: She has a normal mood and affect. Her behavior is normal.      Assessment & Plan:  1. Migraine with aura and without status migrainosus, not intractable Continue Eletriptan.  - eletriptan (RELPAX) 40 MG tablet; Take 1 tablet (40 mg total) by mouth as needed for migraine. 1 tab PO at onset of headache Repeat in 2 hours if headache persists or recurs  Dispense: 10 tablet; Refill: 3  2. Migraine with aura and with status migrainosus, not intractable Has responded well to Toradol and Phenergan injections in the past. Injections provided in office.  - ketorolac (TORADOL) injection 60 mg - promethazine (PHENERGAN) injection 25 mg  3. Non-intractable vomiting with nausea, unspecified vomiting type Refill sent.  - ondansetron (ZOFRAN-ODT) 8 MG disintegrating tablet; Take 0.5-1 tablets (4-8 mg total) by mouth every 8  (eight) hours as needed for nausea.  Dispense: 90 tablet; Refill: 0  4. Medication monitoring encounter 5. Bipolar 2 disorder [facility]) Psychiatrist advised the following labs. - Valproic acid level - Comprehensive metabolic panel - Lipid panel - Amylase  6. Pain of toe of right foot Etiology unclear. Possibly due to repeated friction. No signs of trauma or infection. Follow-up with podiatrist.  Return in about 3 months (around 06/06/2017) for Annual Castor, PA-S

## 2017-03-08 NOTE — Patient Instructions (Addendum)
Schedule with your podiatrist for the place on your toe.    IF you received an x-ray today, you will receive an invoice from [facility]. Please contact [facility] at [phone] with questions or concerns regarding your invoice.   IF you received labwork today, you will receive an invoice from [facility]. Please contact [facility] at [phone] with questions or concerns regarding your invoice.   Our billing staff will not be able to assist you with questions regarding bills from these companies.  You will be contacted with the lab results as soon as they are available. The fastest way to get your results is to activate your My Chart account. Instructions are located on the last page of this paperwork. If you have not heard from [place] regarding the results in 2 weeks, please contact this office.

## 2017-03-08 NOTE — Progress Notes (Signed)
Patient ID: Michelle Torres, female    DOB: May 18, 1960, 57 y.o.   MRN: [id number]  PCP: Harrison Mons, PA-C  Chief Complaint  Patient presents with  . Migraine    Pt states migraine come on this morning. Pt states she hasn't had anything to eat since this morning. Pt states she has tried taking OTC Excerdrin and than tried Zofran and Relpax.  . Toe Pain    x2 months pt states she has a lump on her left big toe that is raised and red and it hurts to wear shoes.  . Medication Refill    Relpax 10 MG, Zofran 8 MG    Subjective:   Presents for evaluation of migraine and toe pain.  1. Awoke at 4 am today with mild migraine headache. Used Relpax and Zofran per usual for headache. Has been able to drink, but nausea prevents eating. Had a pack of nabs this morning, nothing else since. Feels bloated. HA persisted, so took Relpax and Zofran again about 3 pm, without benefit. Pain is in both temples, of typical quality and severity for her. She has nausea, light senstivity, also typical for her. No weakness, blurred vision, paresthesias, dizziness.  Her fasting glucose was a little higher than usual this morning (134, compared to 70).  2. RIGHT great toe pain x 2 months. Painful with walking. Changes shoes regularly. No trauma/injury recalled.    Review of Systems As above.    Patient Active Problem List   Diagnosis Date Noted  . Nausea and vomiting 08/28/2016  . GERD (gastroesophageal reflux disease) 02/15/2014  . IBS (irritable bowel syndrome) 02/15/2014  . Osteopenia 02/08/2014  . 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 Vitamin D deficiency 08/27/2013  . Hypothyroidism 08/27/2013  . Migraine 08/14/2013  . Bipolar 2 disorder ([place]) 03/05/2011  . Diabetes mellitus ([place]) 03/05/2011  . Fatty infiltration of liver 03/02/2011     Prior to Admission medications   Medication Sig Start Date End Date Taking? Authorizing Provider  alosetron (LOTRONEX) 0.5 MG tablet Take 0.5 mg  by mouth 2 (two) times daily.   Yes [provider]  azaTHIOprine (IMURAN) 50 MG tablet Take 50 mg by mouth 3 (three) times daily.    Yes [provider]  azelastine (ASTELIN) 0.1 % nasal spray Place 2 sprays into both nostrils 2 (two) times daily. Use in each nostril as directed 07/24/16  Yes Jacqualynn Parco, PA-C  buPROPion (WELLBUTRIN XL) 150 MG 24 hr tablet Take 150 mg by mouth daily.   Yes [provider]  calcium carbonate 1250 MG capsule Take 1,000 mg by mouth daily.   Yes [provider]  cetirizine (ZYRTEC) 10 MG tablet Take 10 mg by mouth daily.   Yes [provider]  cholecalciferol (VITAMIN D) 1000 UNITS tablet Take 5,000 Units by mouth daily.   Yes [provider]  clonazePAM (KLONOPIN) 1 MG tablet Take 3 mg by mouth at bedtime. May take 1/2 to 1 tablet during the day as needed    Yes [provider]  divalproex (DEPAKOTE ER) 500 MG 24 hr tablet Take 1,500 mg by mouth at bedtime.    Yes [provider]  eletriptan (RELPAX) 40 MG tablet Take 1 tablet (40 mg total) by mouth as needed for migraine. One tablet by mouth at onset of headache. May repeat in 2 hours if headache persists or recurs. 03/27/16  Yes Aireonna Bauer, PA-C  empagliflozin (JARDIANCE) 10 MG TABS tablet Take 10 mg  by mouth daily.   Yes [provider]  fluticasone (FLONASE) 50 MCG/ACT nasal spray Place 2 sprays into the nose as needed for rhinitis.   Yes [provider]  gabapentin (NEURONTIN) 600 MG tablet Take 600 mg by mouth at bedtime.     Yes [provider]  insulin degludec (TRESIBA) 100 UNIT/ML SOPN FlexTouch Pen Inject 50 Units into the skin daily at 10 pm.    Yes [provider]  Insulin Lispro (HUMALOG KWIKPEN) 200 UNIT/ML SOPN Inject into the skin.   Yes [provider]  lamoTRIgine (LAMICTAL) 100 MG tablet Take 200 mg by mouth every morning.    Yes [provider]  levothyroxine  (SYNTHROID, LEVOTHROID) 75 MCG tablet Take 75 mcg by mouth daily before breakfast.   Yes [provider]  loperamide (IMODIUM) 2 MG capsule Take by mouth as needed for diarrhea or loose stools.   Yes [provider]  Melatonin 10 MG CAPS Take by mouth.   Yes [provider]  metFORMIN (GLUCOPHAGE-XR) 500 MG 24 hr tablet Take 1,000 mg by mouth 2 (two) times daily.   Yes [provider]  naproxen (NAPROSYN) 500 MG tablet Take 500 mg by mouth 2 (two) times daily as needed (for pain).    Yes [provider]  NEEDLE, DISP, 30 G 30G X 3/4" MISC Use as directed, daily blood sugars ( please dispense what patient had previously) 07/14/13  Yes Le, Thao P, DO  omeprazole (PRILOSEC) 40 MG capsule Take 1 capsule (40 mg total) by mouth daily. 02/08/14  Yes Lyza Houseworth, PA-C  ondansetron (ZOFRAN) 4 MG tablet Take 1 tablet (4 mg total) by mouth every 8 (eight) hours as needed for nausea or vomiting. 08/28/16  Yes Sagardia, Ines Bloomer, MD  ondansetron (ZOFRAN-ODT) 8 MG disintegrating tablet Take 0.5-1 tablets (4-8 mg total) by mouth every 8 (eight) hours as needed for nausea. 08/26/14  Yes Harrison Mons, PA-C  ONETOUCH VERIO test strip  01/25/14  Yes [provider]  pravastatin (PRAVACHOL) 40 MG tablet Take 40 mg by mouth daily.   Yes [provider]  pregabalin (LYRICA) 75 MG capsule Take 75 mg by mouth every morning.    Yes [provider]  promethazine (PHENERGAN) 12.5 MG suppository Place 1 suppository (12.5 mg total) rectally every 6 (six) hours as needed for nausea or vomiting. 01/21/17  Yes English, Colletta [place] D, PA  promethazine (PHENERGAN) 25 MG tablet Take 1 tablet (25 mg total) by mouth every 8 (eight) hours as needed for nausea or vomiting. 10/16/16  Yes Rutherford Guys, MD  sertraline (ZOLOFT) 25 MG tablet Take 25 mg by mouth daily.   Yes [provider]  topiramate (TOPAMAX) 50 MG tablet Take 1 tablet (50 mg total) by mouth  2 (two) times daily. 03/27/16  Yes Vernadine Coombs, PA-C  TRULICITY 1.5 KD/9.8PJ SOPN INJECT 1.5 MG WEEKLY FOR DIABETES 09/22/14  Yes [provider]  OXcarbazepine (TRILEPTAL) 150 MG tablet Take 150 mg by mouth at bedtime.     [provider]     Allergies  Allergen Reactions  . Mestinon [Pyridostigmine Bromide] Nausea And Vomiting    "violently ill"  . Risperidone And Related Other (See Comments)    liver damage       Objective:  Physical Exam  Constitutional: She is oriented to person, place, and time. She appears well-developed and well-nourished. She is active and cooperative. No distress.  BP 126/86 (BP Location: Left Arm, Patient Position:  Sitting, Cuff Size: Large)   Pulse 76   Temp 98.6 F (37 C) (Oral)   Resp 18   Ht 5' 0.5" (1.537 m)   Wt 200 lb 6.4 oz (90.9 kg)   SpO2 98%   BMI 38.49 kg/m  Roomed darkened for comfort.  HENT:  Head: Normocephalic and atraumatic.  Right Ear: Hearing normal.  Left Ear: Hearing normal.  Eyes: Conjunctivae are normal. No scleral icterus.  Neck: Normal range of motion. Neck supple. No thyromegaly present.  Cardiovascular: Normal rate, regular rhythm and normal heart sounds.  Pulses:      Radial pulses are 2+ on the right side, and 2+ on the left side.  Pulmonary/Chest: Effort normal and breath sounds normal.  Musculoskeletal:       Right foot: There is tenderness (First MTP.). There is normal range of motion (but with pain), no bony tenderness, no swelling, normal capillary refill, no crepitus, no deformity and no laceration.  Lymphadenopathy:       Head (right side): No tonsillar, no preauricular, no posterior auricular and no occipital adenopathy present.       Head (left side): No tonsillar, no preauricular, no posterior auricular and no occipital adenopathy present.    She has no cervical adenopathy.       Right: No supraclavicular adenopathy present.       Left: No supraclavicular adenopathy present.    Neurological: She is alert and oriented to person, place, and time. No sensory deficit.  Skin: Skin is warm, dry and intact. No rash noted. No cyanosis or erythema. Nails show no clubbing.  Psychiatric: She has a normal mood and affect. Her speech is normal and behavior is normal.           Assessment & Plan:   Problem List Items Addressed This Visit    Migraine - Primary (Chronic)    Acute treatment with toradol and promethazine IM here. Continue with home care as before.      Relevant Medications   ketorolac (TORADOL) injection 60 mg (Completed)   promethazine (PHENERGAN) injection 25 mg (Completed)   eletriptan (RELPAX) 40 MG tablet   Bipolar 2 disorder ([place])    Labs drawn for medication management on the request of her psychiatrist.      Relevant Orders   Valproic acid level (Completed)   Comprehensive metabolic panel (Completed)   Lipid panel (Completed)   Amylase (Completed)   Nausea and vomiting    Due to migraine. Continue home care as before.      Relevant Medications   ondansetron (ZOFRAN-ODT) 8 MG disintegrating tablet    Other Visit Diagnoses    Medication monitoring encounter       Relevant Orders   Valproic acid level (Completed)   Comprehensive metabolic panel (Completed)   Lipid panel (Completed)   Amylase (Completed)   Pain of toe of right foot       Consider wearing a hard soled shoe to reduce bending, and select shoes that are wide in the toe box. Follow up with her podiatrist.       Return in about 3 months (around 06/06/2017) for Annual Wellness Exam.   Fara Chute, PA-C [facility]

## 2017-03-09 LAB — SPECIMEN STATUS

## 2017-03-11 LAB — COMPREHENSIVE METABOLIC PANEL
A/G RATIO: 1.3 (ref 1.2–2.2)
ALK PHOS: 82 IU/L (ref 39–117)
ALT: 14 IU/L (ref 0–32)
AST: 25 IU/L (ref 0–40)
Albumin: 4.3 g/dL (ref 3.5–5.5)
BILIRUBIN TOTAL: 0.4 mg/dL (ref 0.0–1.2)
BUN / CREAT RATIO: 15 (ref 9–23)
BUN: 13 mg/dL (ref 6–24)
CHLORIDE: 104 mmol/L (ref 96–106)
CO2: 23 mmol/L (ref 20–29)
Calcium: 10.2 mg/dL (ref 8.7–10.2)
Creatinine, Ser: 0.88 mg/dL (ref 0.57–1.00)
GFR calc Af Amer: 86 mL/min/{1.73_m2} (ref >59)
GFR calc non Af Amer: 75 mL/min/{1.73_m2} (ref >59)
Globulin, Total: 3.4 g/dL (ref 1.5–4.5)
Glucose: 58 mg/dL — ABNORMAL LOW (ref 65–99)
POTASSIUM: 4.8 mmol/L (ref 3.5–5.2)
SODIUM: 144 mmol/L (ref 134–144)
Total Protein: 7.7 g/dL (ref 6.0–8.5)

## 2017-03-11 LAB — LIPID PANEL
CHOLESTEROL TOTAL: 151 mg/dL (ref 100–199)
Chol/HDL Ratio: 2.6 ratio (ref 0.0–4.4)
HDL: 57 mg/dL (ref >39)
LDL Calculated: 76 mg/dL (ref 0–99)
TRIGLYCERIDES: 92 mg/dL (ref 0–149)
VLDL Cholesterol Cal: 18 mg/dL (ref 5–40)

## 2017-03-11 LAB — VALPROIC ACID LEVEL: Valproic Acid Lvl: 43 ug/mL — ABNORMAL LOW (ref 50–100)

## 2017-03-11 LAB — AMYLASE: Amylase: 121 U/L (ref 31–124)

## 2017-03-15 ENCOUNTER — Encounter: Payer: Self-pay | Admitting: Physician Assistant

## 2017-03-19 ENCOUNTER — Other Ambulatory Visit: Payer: Self-pay

## 2017-03-19 ENCOUNTER — Telehealth: Payer: Self-pay | Admitting: Physician Assistant

## 2017-03-19 MED ORDER — ALOSETRON HCL 1 MG PO TABS: 0.5000 mg | ORAL_TABLET | Freq: Two times a day (BID) | ORAL | 1 refills | Status: AC

## 2017-03-19 MED ORDER — AZATHIOPRINE 50 MG PO TABS: 50.0000 mg | ORAL_TABLET | Freq: Three times a day (TID) | ORAL | 3 refills | Status: DC

## 2017-03-19 NOTE — Telephone Encounter (Signed)
Copied from [place]. Topic: Quick Communication - Rx Refill/Question >> Mar 19, 2017  1:27 PM Burnis Medin, NT wrote: Medication: azaTHIOprine (IMURAN) 50 MG tablet   Has the patient contacted their pharmacy? Yes    (Agent: If no, request that the patient contact the pharmacy for the refill.)   Preferred Pharmacy (with phone number or street name): [id number] [place], [place] [phone] (Phone) [phone] (Fax)     Agent: Please be advised that RX refills may take up to 3 business days. We ask that you follow-up with your pharmacy.

## 2017-03-20 ENCOUNTER — Ambulatory Visit: Payer: BC Managed Care – PPO | Admitting: Podiatry

## 2017-03-20 ENCOUNTER — Encounter: Payer: Self-pay | Admitting: Podiatry

## 2017-03-20 DIAGNOSIS — M21619 Bunion of unspecified foot: Secondary | ICD-10-CM | POA: Diagnosis not present

## 2017-03-20 DIAGNOSIS — M205X1 Other deformities of toe(s) (acquired), right foot: Secondary | ICD-10-CM | POA: Diagnosis not present

## 2017-03-20 DIAGNOSIS — M21961 Unspecified acquired deformity of right lower leg: Secondary | ICD-10-CM

## 2017-03-20 NOTE — Patient Instructions (Signed)
Seen for pain in right great toe joint. Noted of severe arthritic change with spur formation and elevated position of the first metatarsal bone. Reviewed findings and available options. Cortisone injection given. Surgery consent form reviewed.

## 2017-03-20 NOTE — Progress Notes (Signed)
SUBJECTIVE: 57 y.o. year old female presents complaining of pain in right foot. Patient points protruding bunion to be the source of foot pain.  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  Review of Systems  Constitutional: Negative.   HENT: Negative.   Eyes: Negative.   Respiratory: Negative.   Cardiovascular: Negative.   Gastrointestinal: Negative.   Genitourinary: Negative.   Musculoskeletal: Negative.   Skin: Negative.      OBJECTIVE: DERMATOLOGIC EXAMINATION: No growth deformity.  VASCULAR EXAMINATION OF LOWER LIMBS: All pedal pulses are palpable with normal pulsation.  Capillary Filling times within 3 seconds in all digits.  No visible edema or erythema noted. Temperature gradient from tibial crest to dorsum of foot is within normal bilateral.  NEUROLOGIC EXAMINATION OF THE LOWER LIMBS: All epicritic and tactile sensations grossly intact. Sharp and Dull discriminatory sensations at the plantar ball of hallux is intact bilateral.   MUSCULOSKELETAL EXAMINATION: Positive for sharp protruding bone dorsally over at the first metatarsal head right foot. Limited joint motion first MPJ and pain with motion.   RADIOGRAPHIC STUDIES:  AP View:  Narrowing of joint space with long first metatarsal bone right.  Lateral view:  Severely elevated first metatarsal bone with dorsal bone spur right.  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 Long first metatarsal. Dorsal bunion right. Pain with ambulation.  PLAN: Reviewed clinical findings and available treatment options. Right first MPJ injected with mixture of 4 mg Dexamethasone, 4 mg Triamcinolone, and 1 cc of 0.5% Marcaine plain. Patient tolerated well without difficulty.  Surgery consent form reviewed for Biplane Austin procedure right foot.

## 2017-03-21 ENCOUNTER — Telehealth: Payer: Self-pay

## 2017-03-21 ENCOUNTER — Encounter: Payer: Self-pay | Admitting: Physician Assistant

## 2017-03-21 NOTE — Telephone Encounter (Signed)
Called [employer] to get benefits for Michelle Torres bunionectomy. Procedure is covered at 80% after meeting $1250.00 deductible. $0.00 of the decutible has been met. Spoke with pts husband and advised him that half of the $1250.00 would be due prior to surgery. Pt will call back to let us know if she would like to proceed with surgery.

## 2017-03-21 NOTE — Telephone Encounter (Signed)
Please contact Dr. Altheimer's office for the most recent A1C on this patient. Please add it to her record as a Quick Abstraction so that the care gap can be closed. Once that is complete, please close this encounter.

## 2017-03-24 NOTE — Assessment & Plan Note (Signed)
Due to migraine. Continue home care as before.

## 2017-03-24 NOTE — Assessment & Plan Note (Addendum)
Acute treatment with toradol and promethazine IM here. Continue with home care as before.

## 2017-03-24 NOTE — Assessment & Plan Note (Signed)
Labs drawn for medication management on the request of her psychiatrist.

## 2017-03-25 NOTE — Telephone Encounter (Signed)
Pt returned call requesting to be scheduled for 04/05/17. Advised pt that $625.00 would be due up front. Pt verbalized understanding and will bring the payment prior to surgery date. Faxed information to [facility].

## 2017-03-26 ENCOUNTER — Ambulatory Visit (INDEPENDENT_AMBULATORY_CARE_PROVIDER_SITE_OTHER): Payer: BC Managed Care – PPO | Admitting: Physician Assistant

## 2017-03-26 ENCOUNTER — Other Ambulatory Visit: Payer: Self-pay | Admitting: Physician Assistant

## 2017-03-26 ENCOUNTER — Ambulatory Visit: Payer: BC Managed Care – PPO | Admitting: Podiatry

## 2017-03-26 DIAGNOSIS — Z5181 Encounter for therapeutic drug level monitoring: Secondary | ICD-10-CM

## 2017-03-27 ENCOUNTER — Ambulatory Visit: Payer: BC Managed Care – PPO | Admitting: Podiatry

## 2017-03-27 LAB — CBC WITH DIFFERENTIAL/PLATELET
BASOS ABS: 0.1 10*3/uL (ref 0.0–0.2)
BASOS: 1 %
EOS (ABSOLUTE): 0.1 10*3/uL (ref 0.0–0.4)
EOS: 1 %
HEMATOCRIT: 40.1 % (ref 34.0–46.6)
HEMOGLOBIN: 13.2 g/dL (ref 11.1–15.9)
IMMATURE GRANS (ABS): 0 10*3/uL (ref 0.0–0.1)
Immature Granulocytes: 0 %
LYMPHS: 38 %
Lymphocytes Absolute: 3.5 10*3/uL — ABNORMAL HIGH (ref 0.7–3.1)
MCH: 29.6 pg (ref 26.6–33.0)
MCHC: 32.9 g/dL (ref 31.5–35.7)
MCV: 90 fL (ref 79–97)
Monocytes Absolute: 0.7 10*3/uL (ref 0.1–0.9)
Monocytes: 8 %
NEUTROS ABS: 4.7 10*3/uL (ref 1.4–7.0)
Neutrophils: 52 %
Platelets: 372 10*3/uL (ref 150–379)
RBC: 4.46 x10E6/uL (ref 3.77–5.28)
RDW: 15.6 % — ABNORMAL HIGH (ref 12.3–15.4)
WBC: 9.1 10*3/uL (ref 3.4–10.8)

## 2017-03-29 ENCOUNTER — Encounter: Payer: Self-pay | Admitting: Physician Assistant

## 2017-04-04 ENCOUNTER — Other Ambulatory Visit: Payer: Self-pay | Admitting: Podiatry

## 2017-04-04 MED ORDER — HYDROCODONE-IBUPROFEN 7.5-200 MG PO TABS: 1.0000 | ORAL_TABLET | Freq: Four times a day (QID) | ORAL | 0 refills | Status: DC | PRN

## 2017-04-05 DIAGNOSIS — M2011 Hallux valgus (acquired), right foot: Secondary | ICD-10-CM | POA: Diagnosis not present

## 2017-04-05 HISTORY — PX: TOE SURGERY: SHX1073

## 2017-04-10 ENCOUNTER — Encounter: Payer: Self-pay | Admitting: *Deleted

## 2017-04-10 ENCOUNTER — Ambulatory Visit (INDEPENDENT_AMBULATORY_CARE_PROVIDER_SITE_OTHER): Payer: BC Managed Care – PPO | Admitting: Podiatry

## 2017-04-10 ENCOUNTER — Encounter: Payer: Self-pay | Admitting: Podiatry

## 2017-04-10 DIAGNOSIS — M205X1 Other deformities of toe(s) (acquired), right foot: Secondary | ICD-10-CM

## 2017-04-10 NOTE — Progress Notes (Signed)
Subjective: S/P Biplane Austin bunionectomy right foot 04/05/17.  Patient denies having chills or fever, or calf tenderness. Taking Advil for pain as needed. Objective: Surgical dressing is clean and well maintained. Surgical wound is clean and dry. No edema or erythema noted. Mild forefoot ecchymosis noted over the 1st and 2nd toe base. Assessment: Satisfactory wound healing without complications. Plan: Dressing change done. Continue with surgical shoes and ambulate as tolerated. Ok to return to clerical work. Elevate lower limb while sitting. Return in one week.

## 2017-04-10 NOTE — Patient Instructions (Addendum)
5 days post op visit. Normal wound healing with minimum edema. Continue with minimum weight bearing and range of motion exercise. Dressing change done.  Continue with surgical shoes and ambulate as tolerated. Return in one week.

## 2017-04-16 ENCOUNTER — Ambulatory Visit (INDEPENDENT_AMBULATORY_CARE_PROVIDER_SITE_OTHER): Payer: BC Managed Care – PPO | Admitting: Podiatry

## 2017-04-16 ENCOUNTER — Encounter: Payer: Self-pay | Admitting: Podiatry

## 2017-04-16 DIAGNOSIS — M205X1 Other deformities of toe(s) (acquired), right foot: Secondary | ICD-10-CM | POA: Diagnosis not present

## 2017-04-16 DIAGNOSIS — M21961 Unspecified acquired deformity of right lower leg: Secondary | ICD-10-CM

## 2017-04-16 NOTE — Patient Instructions (Signed)
2 week post op following right foot bunionectomy. Wound healing normal without complication. Good range of motion. Continue in surgical shoe for ambulation. Return in 2 weeks.

## 2017-04-16 NOTE — Progress Notes (Signed)
2 weeks S/P Biplane Austin bunionectomy right 04/05/17.  Patient denies any problem with her surgical foot. Post op X-ray is consistent with surgery performed. Suture removed. Full range of motion without limitation noted. Satisfactory recovery. Mefix tape, Tube foam gauze, compression stocinet placed with home care instruction. Continue in surgery shoe. Return in 2 weeks.

## 2017-04-22 ENCOUNTER — Ambulatory Visit (INDEPENDENT_AMBULATORY_CARE_PROVIDER_SITE_OTHER): Payer: BC Managed Care – PPO | Admitting: Podiatry

## 2017-04-22 ENCOUNTER — Encounter: Payer: Self-pay | Admitting: Podiatry

## 2017-04-22 DIAGNOSIS — M205X1 Other deformities of toe(s) (acquired), right foot: Secondary | ICD-10-CM

## 2017-04-22 NOTE — Patient Instructions (Signed)
Mefix taple replaced with home care instruction. Return in 2 weeks.

## 2017-04-22 NOTE — Progress Notes (Signed)
3 weeks S/P Biplane Austin bunionectomy right 04/05/17. Stated that she pulled off Mefix tape and made the incision site bled. Area is still covered with tape. Dry blood noted of incision line in center. Area cleansed and replaced old tape with new Mefix tape. Home care instruction given. Return in 2 weeks.

## 2017-04-30 ENCOUNTER — Encounter: Payer: BC Managed Care – PPO | Admitting: Podiatry

## 2017-05-07 ENCOUNTER — Ambulatory Visit (INDEPENDENT_AMBULATORY_CARE_PROVIDER_SITE_OTHER): Payer: BC Managed Care – PPO | Admitting: Podiatry

## 2017-05-07 ENCOUNTER — Encounter: Payer: Self-pay | Admitting: Podiatry

## 2017-05-07 ENCOUNTER — Encounter: Payer: Self-pay | Admitting: *Deleted

## 2017-05-07 DIAGNOSIS — M21961 Unspecified acquired deformity of right lower leg: Secondary | ICD-10-CM

## 2017-05-07 NOTE — Patient Instructions (Signed)
4 and a half week post op right foot bunion surgery. Healing well with good motion at the joint. Continue with compression stockinet. May use regular shoe. Return in 4 weeks.

## 2017-05-07 NOTE — Progress Notes (Signed)
4 and a half weeks S/P Biplane Austin bunionectomy right 04/05/17. Well coapted incision site with center dry blood crust. Mefix tape replaced and placed with Compression stockinet. Good joint motion at the first MPJ. Mild forefoot edema consistent with surgery. Maintained correction of the first metatarsal. Ok to use regular shoe with light activity. Return in 4 weeks.

## 2017-05-09 ENCOUNTER — Encounter: Payer: Self-pay | Admitting: Physician Assistant

## 2017-05-09 ENCOUNTER — Ambulatory Visit: Payer: BC Managed Care – PPO | Admitting: Physician Assistant

## 2017-05-09 ENCOUNTER — Other Ambulatory Visit: Payer: Self-pay

## 2017-05-09 VITALS — BP 112/70 | HR 81 | Temp 99.1°F | Resp 18 | Ht 61.14 in | Wt 195.2 lb

## 2017-05-09 DIAGNOSIS — B9789 Other viral agents as the cause of diseases classified elsewhere: Secondary | ICD-10-CM | POA: Diagnosis not present

## 2017-05-09 DIAGNOSIS — G43109 Migraine with aura, not intractable, without status migrainosus: Secondary | ICD-10-CM | POA: Diagnosis not present

## 2017-05-09 DIAGNOSIS — R112 Nausea with vomiting, unspecified: Secondary | ICD-10-CM

## 2017-05-09 DIAGNOSIS — J069 Acute upper respiratory infection, unspecified: Secondary | ICD-10-CM

## 2017-05-09 MED ORDER — MAGIC MOUTHWASH W/LIDOCAINE: 10.0000 mL | ORAL | 0 refills | Status: DC | PRN

## 2017-05-09 MED ORDER — ONDANSETRON 8 MG PO TBDP: 4.0000 mg | ORAL_TABLET | Freq: Three times a day (TID) | ORAL | 12 refills | Status: DC | PRN

## 2017-05-09 MED ORDER — ELETRIPTAN HYDROBROMIDE 40 MG PO TABS: 40.0000 mg | ORAL_TABLET | ORAL | 12 refills | Status: DC | PRN

## 2017-05-09 NOTE — Patient Instructions (Addendum)
IF you received an x-ray today, you will receive an invoice from [facility]. Please contact [facility] at [phone] with questions or concerns regarding your invoice.   IF you received labwork today, you will receive an invoice from [facility]. Please contact [facility] at [phone] with questions or concerns regarding your invoice.   Our billing staff will not be able to assist you with questions regarding bills from these companies.  You will be contacted with the lab results as soon as they are available. The fastest way to get your results is to activate your My Chart account. Instructions are located on the last page of this paperwork. If you have not heard from [place] regarding the results in 2 weeks, please contact this office.     Viral Respiratory Infection A respiratory infection is an illness that affects part of the respiratory system, such as the lungs, nose, or throat. Most respiratory infections are caused by either viruses or bacteria. A respiratory infection that is caused by a virus is called a viral respiratory infection. Common types of viral respiratory infections include:  A cold.  The flu (influenza).  A respiratory syncytial virus (RSV) infection.  How do I know if I have a viral respiratory infection? Most viral respiratory infections cause:  A stuffy or runny nose.  Yellow or green nasal discharge.  A cough.  Sneezing.  Fatigue.  Achy muscles.  A sore throat.  Sweating or chills.  A fever.  A headache.  How are viral respiratory infections treated? If influenza is diagnosed early, it may be treated with an antiviral medicine that shortens the length of time a person has symptoms. Symptoms of viral respiratory infections may be treated with over-the-counter and prescription medicines, such as:  Expectorants. These make it easier to cough up mucus.  Decongestant nasal sprays.  Health care providers do not prescribe  antibiotic medicines for viral infections. This is because antibiotics are designed to kill bacteria. They have no effect on viruses. How do I know if I should stay home from work or school? To avoid exposing others to your respiratory infection, stay home if you have:  A fever.  A persistent cough.  A sore throat.  A runny nose.  Sneezing.  Muscles aches.  Headaches.  Fatigue.  Weakness.  Chills.  Sweating.  Nausea.  Follow these instructions at home:  Rest as much as possible.  Take over-the-counter and prescription medicines only as told by your health care provider.  Drink enough fluid to keep your urine clear or pale yellow. This helps prevent dehydration and helps loosen up mucus.  Gargle with a salt-water mixture 3-4 times per day or as needed. To make a salt-water mixture, completely dissolve -1 tsp of salt in 1 cup of warm water.  Use nose drops made from salt water to ease congestion and soften raw skin around your nose.  Do not drink alcohol.  Do not use tobacco products, including cigarettes, chewing tobacco, and e-cigarettes. If you need help quitting, ask your health care provider. Contact a health care provider if:  Your symptoms last for 10 days or longer.  Your symptoms get worse over time.  You have a fever.  You have severe sinus pain in your face or forehead.  The glands in your jaw or neck become very swollen. Get help right away if:  You feel pain or pressure in your chest.  You have shortness of breath.  You faint or feel like you  will faint.  You have severe and persistent vomiting.  You feel confused or disoriented. This information is not intended to replace advice given to you by your health care provider. Make sure you discuss any questions you have with your health care provider. Document Released: 11/15/2004 Document Revised: 07/14/2015 Document Reviewed: 07/14/2014 Elsevier Interactive Patient Education  [employer].

## 2017-05-09 NOTE — Progress Notes (Signed)
Patient ID: Michelle Torres, female    DOB: December 02, 1960, 57 y.o.   MRN: [id number]  PCP: Harrison Mons, PA-C  Chief Complaint  Patient presents with  . Ear Pain    X 3 days right ear  . Medication Refill    Relpax, zofran-odt  . Hoarse    X 2 days    Subjective:   Presents for evaluation of ear pain, laryngitis and medication refills.  She reports hoarseness and RIGHT ear pain x 2-3 days. Nasal congestion, drainage. Sore throat. No coughing. Some chills, no fever. Son with recent URI symptoms, fever.   Recent migraine. Needs refill of Maxalt and Zofran. Migraine is long-standing, though reduced frequency with improved control of diabetes, bipolar disorder and ocular myasthenia gravis. No new or atypical features for her. Current treatments work well in most cases. Does sometimes need in office treatment for resolution.   Review of Systems As above.    Patient Active Problem List   Diagnosis Date Noted  . Nausea and vomiting 08/28/2016  . GERD (gastroesophageal reflux disease) 02/15/2014  . IBS (irritable bowel syndrome) 02/15/2014  . Osteopenia 02/08/2014  . Ocular myasthenia gravis ([place]) 10/07/2013  . Vitamin D deficiency 08/27/2013  . Primary hypothyroidism 08/27/2013  . Migraine 08/14/2013  . Bipolar 2 disorder ([place]) 03/05/2011  . Type 2 diabetes mellitus with hyperglycemia, with long-term current use of insulin ([place]) 03/05/2011  . Fatty infiltration of liver 03/02/2011     Prior to Admission medications   Medication Sig Start Date End Date Taking? Authorizing Provider  alosetron (LOTRONEX) 1 MG tablet Take 0.5 tablets (0.5 mg total) by mouth 2 (two) times daily. 03/19/17  Yes Jessiah Steinhart, PA-C  azaTHIOprine (IMURAN) 50 MG tablet Take 1 tablet (50 mg total) by mouth 3 (three) times daily. 03/19/17  Yes Adam Sanjuan, PA-C  azelastine (ASTELIN) 0.1 % nasal spray Place 2 sprays into both nostrils 2 (two) times daily. Use in each nostril as directed  07/24/16  Yes Marisela Line, PA-C  calcium carbonate 1250 MG capsule Take 1,000 mg by mouth daily.   Yes [provider]  cetirizine (ZYRTEC) 10 MG tablet Take 10 mg by mouth daily.   Yes [provider]  cholecalciferol (VITAMIN D) 1000 UNITS tablet Take 5,000 Units by mouth daily.   Yes [provider]  clonazePAM (KLONOPIN) 1 MG tablet Take 3 mg by mouth at bedtime. May take 1/2 to 1 tablet during the day as needed    Yes [provider]  divalproex (DEPAKOTE ER) 500 MG 24 hr tablet Take 1,500 mg by mouth at bedtime.    Yes [provider]  eletriptan (RELPAX) 40 MG tablet Take 1 tablet (40 mg total) by mouth as needed for migraine. 1 tab PO at onset of headache Repeat in 2 hours if headache persists or recurs 03/08/17  Yes Marketta Valadez, PA-C  empagliflozin (JARDIANCE) 10 MG TABS tablet Take 10 mg by mouth daily.   Yes [provider]  fluticasone (FLONASE) 50 MCG/ACT nasal spray Place 2 sprays into the nose as needed for rhinitis.   Yes [provider]  gabapentin (NEURONTIN) 600 MG tablet Take 600 mg by mouth at bedtime.     Yes [provider]  insulin degludec (TRESIBA) 100 UNIT/ML SOPN FlexTouch Pen Inject 50 Units into the skin daily at 10 pm.    Yes [provider]  Insulin Lispro (HUMALOG KWIKPEN) 200 UNIT/ML SOPN Inject into the skin.   Yes [provider]  lamoTRIgine (LAMICTAL) 100 MG tablet Take 200 mg by mouth every morning.    Yes [provider]  levothyroxine (SYNTHROID, LEVOTHROID) 75 MCG tablet Take 75 mcg by mouth daily before breakfast.   Yes [provider]  loperamide (IMODIUM) 2 MG capsule Take by mouth as needed for diarrhea or loose stools.   Yes [provider]  Melatonin 10 MG CAPS Take by mouth.   Yes [provider]  naproxen (NAPROSYN) 500 MG tablet Take 500 mg by mouth 2 (two) times daily as needed (for pain).    Yes [provider]   NEEDLE, DISP, 30 G 30G X 3/4" MISC Use as directed, daily blood sugars ( please dispense what patient had previously) 07/14/13  Yes Le, Thao P, DO  omeprazole (PRILOSEC) 40 MG capsule Take 1 capsule (40 mg total) by mouth daily. 02/08/14  Yes Harwood Nall, PA-C  ondansetron (ZOFRAN-ODT) 8 MG disintegrating tablet Take 0.5-1 tablets (4-8 mg total) by mouth every 8 (eight) hours as needed for nausea. 03/08/17  Yes Harrison Mons, PA-C  ONETOUCH VERIO test strip  01/25/14  Yes [provider]  OXcarbazepine (TRILEPTAL) 150 MG tablet Take 150 mg by mouth at bedtime.    Yes [provider]  pravastatin (PRAVACHOL) 40 MG tablet Take 40 mg by mouth daily.   Yes [provider]  pregabalin (LYRICA) 75 MG capsule Take 75 mg by mouth every morning.    Yes [provider]  promethazine (PHENERGAN) 25 MG tablet Take 1 tablet (25 mg total) by mouth every 8 (eight) hours as needed for nausea or vomiting. 10/16/16  Yes Rutherford Guys, MD  sertraline (ZOLOFT) 25 MG tablet Take 50 mg by mouth daily.   Yes [provider]  topiramate (TOPAMAX) 50 MG tablet Take 1 tablet (50 mg total) by mouth 2 (two) times daily. 03/27/16  Yes Janeliz Prestwood, PA-C  TRULICITY 1.5 IH/4.7QQ SOPN INJECT 1.5 MG WEEKLY FOR DIABETES 09/22/14  Yes [provider]  HYDROcodone-ibuprofen (VICOPROFEN) 7.5-200 MG tablet Take 1 tablet by mouth every 6 (six) hours as needed for moderate pain. Patient not taking: Reported on 05/09/2017 04/04/17   Sheard, Myeong O, DPM  promethazine (PHENERGAN) 12.5 MG suppository Place 1 suppository (12.5 mg total) rectally every 6 (six) hours as needed for nausea or vomiting. Patient not taking: Reported on 05/09/2017 01/21/17   Ivar Drape D, PA     Allergies  Allergen Reactions  . Pyridostigmine Bromide Nausea And Vomiting    "violently ill" "violently ill"  . Risperidone Other (See Comments)    liver damage "enlarged my liver"  . Risperidone And  Related Other (See Comments)    liver damage       Objective:  Physical Exam  Constitutional: She is oriented to person, place, and time. She appears well-developed and well-nourished. No distress.  BP 112/70 (BP Location: Left Arm, Patient Position: Sitting, Cuff Size: Normal)   Pulse 81   Temp 99.1 F (37.3 C) (Oral)   Resp 18   Ht 5' 1.14" (1.553 m)   Wt 195 lb 3.2 oz (88.5 kg)   SpO2 96%   BMI 36.71 kg/m    HENT:  Head: Normocephalic and atraumatic.  Right Ear: Hearing, tympanic membrane, external ear and ear canal normal.  Left Ear: Hearing, tympanic membrane, external ear and ear canal normal.  Nose: No mucosal edema or rhinorrhea.  No foreign bodies. Right sinus exhibits no maxillary sinus tenderness and no frontal sinus  tenderness. Left sinus exhibits no maxillary sinus tenderness and no frontal sinus tenderness.  Mouth/Throat: Uvula is midline and mucous membranes are normal. No uvula swelling. Posterior oropharyngeal erythema (mild) present. No oropharyngeal exudate, posterior oropharyngeal edema or tonsillar abscesses.  Eyes: Pupils are equal, round, and reactive to light. Conjunctivae and EOM are normal. Right eye exhibits no discharge. Left eye exhibits no discharge. No scleral icterus.  Neck: Trachea normal, normal range of motion and full passive range of motion without pain. Neck supple. No thyroid mass and no thyromegaly present.  Cardiovascular: Normal rate, regular rhythm and normal heart sounds.  Pulmonary/Chest: Effort normal and breath sounds normal.  Lymphadenopathy:       Head (right side): No submandibular, no tonsillar, no preauricular, no posterior auricular and no occipital adenopathy present.       Head (left side): No submandibular, no tonsillar, no preauricular and no occipital adenopathy present.    She has no cervical adenopathy.       Right: No supraclavicular adenopathy present.       Left: No supraclavicular adenopathy present.  Neurological: She  is alert and oriented to person, place, and time. She has normal strength. No cranial nerve deficit or sensory deficit.  Skin: Skin is warm, dry and intact. No rash noted.  Psychiatric: She has a normal mood and affect. Her speech is normal and behavior is normal.           Assessment & Plan:   Problem List Items Addressed This Visit    Migraine (Chronic)    Stable. Continue current treatment.      Relevant Medications   eletriptan (RELPAX) 40 MG tablet   Nausea and vomiting    Associated with migraine HA. Stable.      Relevant Medications   ondansetron (ZOFRAN-ODT) 8 MG disintegrating tablet    Other Visit Diagnoses    Viral URI with cough    -  Primary   Supportive care. Anticipatory guidance provided.   Relevant Medications   magic mouthwash w/lidocaine SOLN       Return if symptoms worsen or fail to improve.   Fara Chute, PA-C [facility]

## 2017-05-17 ENCOUNTER — Other Ambulatory Visit: Payer: Self-pay | Admitting: Physician Assistant

## 2017-05-17 ENCOUNTER — Encounter: Payer: Self-pay | Admitting: Physician Assistant

## 2017-05-17 NOTE — Progress Notes (Signed)
Pharmacy requesting specific recipe for magic mouthwash. Rx was sent 05/09/2017.  Nystatin 100,000 U/mL suspension 36 mL Diphenhydramine 12.5 mg/5 mL elixir 150 mL Lidocaine 2% viscous 100 mL  15-30 mL PO swish and spit/swallow Q4 hours PRN.  Called to her pharmacy ([facility]) [phone].

## 2017-05-24 NOTE — Progress Notes (Signed)
Lab visit only.  Pt did not see provider at this visit.

## 2017-05-27 ENCOUNTER — Encounter: Payer: Self-pay | Admitting: Physician Assistant

## 2017-05-27 ENCOUNTER — Ambulatory Visit: Payer: BC Managed Care – PPO | Admitting: Physician Assistant

## 2017-05-27 ENCOUNTER — Other Ambulatory Visit: Payer: Self-pay

## 2017-05-27 VITALS — BP 114/64 | HR 86 | Temp 98.8°F | Resp 16 | Ht 61.14 in | Wt 191.0 lb

## 2017-05-27 DIAGNOSIS — G43009 Migraine without aura, not intractable, without status migrainosus: Secondary | ICD-10-CM | POA: Diagnosis not present

## 2017-05-27 MED ORDER — KETOROLAC TROMETHAMINE 60 MG/2ML IM SOLN
60.0000 mg | Freq: Once | INTRAMUSCULAR | Status: AC
  Administered 2017-05-27: 60 mg via INTRAMUSCULAR

## 2017-05-27 NOTE — Assessment & Plan Note (Signed)
Stable.  Continue current treatment

## 2017-05-27 NOTE — Patient Instructions (Addendum)
-  Return to clinic if symptoms worsen, do not improve, or as needed  Migraine Headache A migraine headache is a very strong throbbing pain on one side or both sides of your head. Migraines can also cause other symptoms. Talk with your doctor about what things may bring on (trigger) your migraine headaches. Follow these instructions at home: Medicines  Take over-the-counter and prescription medicines only as told by your doctor.  Do not drive or use heavy machinery while taking prescription pain medicine.  To prevent or treat constipation while you are taking prescription pain medicine, your doctor may recommend that you: ? Drink enough fluid to keep your pee (urine) clear or pale yellow. ? Take over-the-counter or prescription medicines. ? Eat foods that are high in fiber. These include fresh fruits and vegetables, whole grains, and beans. ? Limit foods that are high in fat and processed sugars. These include fried and sweet foods. Lifestyle  Avoid alcohol.  Do not use any products that contain nicotine or tobacco, such as cigarettes and e-cigarettes. If you need help quitting, ask your doctor.  Get at least 8 hours of sleep every night.  Limit your stress. General instructions   Keep a journal to find out what may bring on your migraines. For example, write down: ? What you eat and drink. ? How much sleep you get. ? Any change in what you eat or drink. ? Any change in your medicines.  If you have a migraine: ? Avoid things that make your symptoms worse, such as bright lights. ? It may help to lie down in a dark, quiet room. ? Do not drive or use heavy machinery. ? Ask your doctor what activities are safe for you.  Keep all follow-up visits as told by your doctor. This is important. Contact a doctor if:  You get a migraine that is different or worse than your usual migraines. Get help right away if:  Your migraine gets very bad.  You have a fever.  You have a stiff  neck.  You have trouble seeing.  Your muscles feel weak or like you cannot control them.  You start to lose your balance a lot.  You start to have trouble walking.  You pass out (faint). This information is not intended to replace advice given to you by your health care provider. Make sure you discuss any questions you have with your health care provider. Document Released: 11/15/2007 Document Revised: 08/26/2015 Document Reviewed: 07/25/2015 Elsevier Interactive Patient Education  2018 [employer].    IF you received an x-ray today, you will receive an invoice from [facility]. Please contact [facility] at [phone] with questions or concerns regarding your invoice.   IF you received labwork today, you will receive an invoice from [facility]. Please contact [facility] at [phone] with questions or concerns regarding your invoice.   Our billing staff will not be able to assist you with questions regarding bills from these companies.  You will be contacted with the lab results as soon as they are available. The fastest way to get your results is to activate your My Chart account. Instructions are located on the last page of this paperwork. If you have not heard from [place] regarding the results in 2 weeks, please contact this office.

## 2017-05-27 NOTE — Assessment & Plan Note (Signed)
Associated with migraine HA. Stable.

## 2017-05-27 NOTE — Progress Notes (Signed)
Michelle Torres  MRN: [id number] DOB: March 25, 1960  Subjective:  Michelle Torres is a 57 y.o. female seen in office today for a chief complaint of right sided migraine since 4 am this morning. Pt has PMH of migraines. This is one of her "typical" migraines. Describes it has throbbing. Had some nausea this morning and few episodes of vomiting. Has associated photophobia, phonophobia, and worsening of migraine with movement. No aura. Took replax and zofran. Nausea resolved. Migraine improved from a 10/10 to 7/10. Has not been able to identify specific triggers.  Denies acute head injury, visual disturbance, numbness, tingling, confusion, gait unsteadiness, and temporal pain. No other questions or concerns today.   Review of Systems  Constitutional: Negative for chills, diaphoresis and fever.  Gastrointestinal: Negative for abdominal pain.  Neurological: Negative for dizziness, speech difficulty and light-headedness.  Psychiatric/Behavioral: Negative for agitation.    Patient Active Problem List   Diagnosis Date Noted  . Nausea and vomiting 08/28/2016  . GERD (gastroesophageal reflux disease) 02/15/2014  . IBS (irritable bowel syndrome) 02/15/2014  . Osteopenia 02/08/2014  . Ocular myasthenia gravis ([place]) 10/07/2013  . Vitamin D deficiency 08/27/2013  . Primary hypothyroidism 08/27/2013  . Migraine 08/14/2013  . Bipolar 2 disorder ([place]) 03/05/2011  . Type 2 diabetes mellitus with hyperglycemia, with long-term current use of insulin ([place]) 03/05/2011  . Fatty infiltration of liver 03/02/2011    Current Outpatient Medications on File Prior to Visit  Medication Sig Dispense Refill  . alosetron (LOTRONEX) 1 MG tablet Take 0.5 tablets (0.5 mg total) by mouth 2 (two) times daily. 90 tablet 1  . azaTHIOprine (IMURAN) 50 MG tablet Take 1 tablet (50 mg total) by mouth 3 (three) times daily. 270 tablet 3  . azelastine (ASTELIN) 0.1 % nasal spray Place 2 sprays into both nostrils 2 (two) times daily.  Use in each nostril as directed 30 mL 0  . calcium carbonate 1250 MG capsule Take 1,000 mg by mouth daily.    . cetirizine (ZYRTEC) 10 MG tablet Take 10 mg by mouth daily.    . cholecalciferol (VITAMIN D) 1000 UNITS tablet Take 5,000 Units by mouth daily.    . clonazePAM (KLONOPIN) 1 MG tablet Take 3 mg by mouth at bedtime. May take 1/2 to 1 tablet during the day as needed     . divalproex (DEPAKOTE ER) 500 MG 24 hr tablet Take 1,500 mg by mouth at bedtime.     Marland Kitchen eletriptan (RELPAX) 40 MG tablet Take 1 tablet (40 mg total) by mouth as needed for migraine. 1 tab PO at onset of headache Repeat in 2 hours if headache persists or recurs 10 tablet 12  . empagliflozin (JARDIANCE) 10 MG TABS tablet Take 10 mg by mouth daily.    . fluticasone (FLONASE) 50 MCG/ACT nasal spray Place 2 sprays into the nose as needed for rhinitis.    Marland Kitchen gabapentin (NEURONTIN) 600 MG tablet Take 600 mg by mouth at bedtime.      . insulin degludec (TRESIBA) 100 UNIT/ML SOPN FlexTouch Pen Inject 50 Units into the skin daily at 10 pm.     . Insulin Lispro (HUMALOG KWIKPEN) 200 UNIT/ML SOPN Inject into the skin.    Marland Kitchen lamoTRIgine (LAMICTAL) 100 MG tablet Take 200 mg by mouth every morning.     Marland Kitchen levothyroxine (SYNTHROID, LEVOTHROID) 75 MCG tablet Take 75 mcg by mouth daily before breakfast.    . loperamide (IMODIUM) 2 MG capsule Take by mouth as needed for diarrhea or loose stools.    Marland Kitchen  magic mouthwash w/lidocaine SOLN Take 10 mLs by mouth every 2 (two) hours as needed for mouth pain. 360 mL 0  . Melatonin 10 MG CAPS Take by mouth.    . naproxen (NAPROSYN) 500 MG tablet Take 500 mg by mouth 2 (two) times daily as needed (for pain).     Marland Kitchen NEEDLE, DISP, 30 G 30G X 3/4" MISC Use as directed, daily blood sugars ( please dispense what patient had previously) 100 each 3  . omeprazole (PRILOSEC) 40 MG capsule Take 1 capsule (40 mg total) by mouth daily. 30 capsule 3  . ondansetron (ZOFRAN-ODT) 8 MG disintegrating tablet Take 0.5-1 tablets  (4-8 mg total) by mouth every 8 (eight) hours as needed for nausea. 90 tablet 12  . ONETOUCH VERIO test strip   12  . OXcarbazepine (TRILEPTAL) 150 MG tablet Take 150 mg by mouth at bedtime.     . pravastatin (PRAVACHOL) 40 MG tablet Take 40 mg by mouth daily.    . pregabalin (LYRICA) 75 MG capsule Take 75 mg by mouth every morning.     . promethazine (PHENERGAN) 25 MG tablet Take 1 tablet (25 mg total) by mouth every 8 (eight) hours as needed for nausea or vomiting. 20 tablet 0  . sertraline (ZOLOFT) 25 MG tablet Take 50 mg by mouth daily.    Marland Kitchen topiramate (TOPAMAX) 50 MG tablet Take 1 tablet (50 mg total) by mouth 2 (two) times daily. 180 tablet 3  . TRULICITY 1.5 YD/7.4JO SOPN INJECT 1.5 MG WEEKLY FOR DIABETES  11   No current facility-administered medications on file prior to visit.     Allergies  Allergen Reactions  . Pyridostigmine Bromide Nausea And Vomiting    "violently ill" "violently ill"  . Risperidone Other (See Comments)    liver damage "enlarged my liver"  . Risperidone And Related Other (See Comments)    liver damage   Social History   Socioeconomic History  . Marital status: Married    Spouse name: [name] Tagle  . Number of children: 2  . Years of education: 24  . Highest education level: Not on file  Occupational History  . Occupation: PROCESSING ASSISTANT    Employer: [employer]  Social Needs  . Financial resource strain: Not on file  . Food insecurity:    Worry: Not on file    Inability: Not on file  . Transportation needs:    Medical: Not on file    Non-medical: Not on file  Tobacco Use  . Smoking status: Never Smoker  . Smokeless tobacco: Never Used  Substance and Sexual Activity  . Alcohol use: Yes    Alcohol/week: 0.6 oz    Types: 1 Standard drinks or equivalent per week    Comment: once every couple months  . Drug use: No  . Sexual activity: Yes    Partners: Male    Birth control/protection: Post-menopausal  Lifestyle  .  Physical activity:    Days per week: Not on file    Minutes per session: Not on file  . Stress: Not on file  Relationships  . Social connections:    Talks on phone: Not on file    Gets together: Not on file    Attends religious service: Not on file    Active member of club or organization: Not on file    Attends meetings of clubs or organizations: Not on file    Relationship status: Not on file  . Intimate partner violence:  Fear of current or ex partner: Not on file    Emotionally abused: Not on file    Physically abused: Not on file    Forced sexual activity: Not on file  Other Topics Concern  . Not on file  Social History Narrative   Lives with her husband.  Their daughter, lives with them intermittently.  Her son is at [facility] and lives with them. Her husband has a child from a previous relationship who lives in [place].      Objective:  BP 114/64   Pulse 86   Temp 98.8 F (37.1 C)   Resp 16   Ht 5' 1.14" (1.553 m)   Wt 191 lb (86.6 kg)   SpO2 96%   BMI 35.92 kg/m   Physical Exam  Constitutional: She is oriented to person, place, and time. She appears well-developed and well-nourished.  Sitting in dim lit room.   HENT:  Head: Normocephalic and atraumatic.  Mouth/Throat: Uvula is midline, oropharynx is clear and moist and mucous membranes are normal. No tonsillar exudate.  No pain with palpation of bilateral temporal regions.   Eyes: Pupils are equal, round, and reactive to light. Conjunctivae and EOM are normal.  Neck: Normal range of motion and full passive range of motion without pain.  Cardiovascular: Normal rate, regular rhythm and normal heart sounds.  Pulmonary/Chest: Effort normal.  Neurological: She is alert and oriented to person, place, and time. She has normal strength and normal reflexes. No cranial nerve deficit or sensory deficit. She displays a negative Romberg sign. Gait normal.  Normal FNF test   Skin: Skin is warm and dry.  Psychiatric: She has a  normal mood and affect.  Vitals reviewed.  Pt responded well to toradol injection in office.   Assessment and Plan :  1. Migraine without aura and without status migrainosus, not intractable Normal neuro exam. Responded well to toradol injection. Follow up as needed.  - ketorolac (TORADOL) injection 60 mg  Tenna Delaine PA-C  [facility] 05/27/2017 1:23 PM

## 2017-05-28 ENCOUNTER — Encounter: Payer: Self-pay | Admitting: Physician Assistant

## 2017-05-29 ENCOUNTER — Encounter: Payer: Self-pay | Admitting: Physician Assistant

## 2017-06-04 ENCOUNTER — Encounter: Payer: BC Managed Care – PPO | Admitting: Podiatry

## 2017-06-20 ENCOUNTER — Encounter: Payer: Self-pay | Admitting: Podiatry

## 2017-06-20 ENCOUNTER — Ambulatory Visit (INDEPENDENT_AMBULATORY_CARE_PROVIDER_SITE_OTHER): Payer: BC Managed Care – PPO | Admitting: Podiatry

## 2017-06-20 DIAGNOSIS — M21619 Bunion of unspecified foot: Secondary | ICD-10-CM

## 2017-06-20 NOTE — Progress Notes (Signed)
10 weeks S/PBiplane [place] bunionectomy right 04/05/17. Well closed surgical wound. Good range of motion noted. Ok to resume regular activity. Return as needed.

## 2017-06-20 NOTE — Patient Instructions (Signed)
10 weeks S/PBiplane [place] bunionectomy right 04/05/17. Well healed surgical wound. Good range of motion noted. Ok to resume regular activity. Return as needed.

## 2017-06-21 ENCOUNTER — Other Ambulatory Visit: Payer: Self-pay | Admitting: Physician Assistant

## 2017-06-21 DIAGNOSIS — G43109 Migraine with aura, not intractable, without status migrainosus: Secondary | ICD-10-CM

## 2017-06-21 NOTE — Telephone Encounter (Signed)
topiramate refill Last OV: 02/29/16 Last Refill:03/27/16 #180 3 RF Pharmacy:[facility] [address] PCP: Harrison Mons PA-C  Sending back to office due to last OV >1 year

## 2017-07-04 ENCOUNTER — Other Ambulatory Visit: Payer: Self-pay

## 2017-07-04 ENCOUNTER — Encounter: Payer: Self-pay | Admitting: Physician Assistant

## 2017-07-04 ENCOUNTER — Ambulatory Visit: Payer: BC Managed Care – PPO | Admitting: Physician Assistant

## 2017-07-04 VITALS — BP 114/70 | HR 80 | Temp 97.8°F | Resp 16 | Ht 61.14 in | Wt 191.2 lb

## 2017-07-04 DIAGNOSIS — E782 Mixed hyperlipidemia: Secondary | ICD-10-CM

## 2017-07-04 DIAGNOSIS — E039 Hypothyroidism, unspecified: Secondary | ICD-10-CM | POA: Diagnosis not present

## 2017-07-04 DIAGNOSIS — R740 Nonspecific elevation of levels of transaminase and lactic acid dehydrogenase [LDH]: Secondary | ICD-10-CM | POA: Diagnosis not present

## 2017-07-04 DIAGNOSIS — E1165 Type 2 diabetes mellitus with hyperglycemia: Secondary | ICD-10-CM

## 2017-07-04 DIAGNOSIS — G43009 Migraine without aura, not intractable, without status migrainosus: Secondary | ICD-10-CM

## 2017-07-04 DIAGNOSIS — E559 Vitamin D deficiency, unspecified: Secondary | ICD-10-CM

## 2017-07-04 DIAGNOSIS — Z124 Encounter for screening for malignant neoplasm of cervix: Secondary | ICD-10-CM | POA: Diagnosis not present

## 2017-07-04 DIAGNOSIS — Z794 Long term (current) use of insulin: Secondary | ICD-10-CM | POA: Diagnosis not present

## 2017-07-04 DIAGNOSIS — G7000 Myasthenia gravis without (acute) exacerbation: Secondary | ICD-10-CM

## 2017-07-04 DIAGNOSIS — Z23 Encounter for immunization: Secondary | ICD-10-CM | POA: Diagnosis not present

## 2017-07-04 DIAGNOSIS — F3181 Bipolar II disorder: Secondary | ICD-10-CM

## 2017-07-04 DIAGNOSIS — R7401 Elevation of levels of liver transaminase levels: Secondary | ICD-10-CM

## 2017-07-04 DIAGNOSIS — R0981 Nasal congestion: Secondary | ICD-10-CM

## 2017-07-04 MED ORDER — FLUTICASONE PROPIONATE 50 MCG/ACT NA SUSP: 2.0000 | Freq: Every day | NASAL | 3 refills | Status: AC

## 2017-07-04 MED ORDER — PROMETHAZINE HCL 25 MG PO TABS: 25.0000 mg | ORAL_TABLET | Freq: Three times a day (TID) | ORAL | 0 refills | Status: AC | PRN

## 2017-07-04 MED ORDER — ELETRIPTAN HYDROBROMIDE 40 MG PO TABS: 40.0000 mg | ORAL_TABLET | ORAL | 12 refills | Status: DC | PRN

## 2017-07-04 MED ORDER — ONDANSETRON 8 MG PO TBDP: 4.0000 mg | ORAL_TABLET | Freq: Three times a day (TID) | ORAL | 12 refills | Status: DC | PRN

## 2017-07-04 NOTE — Assessment & Plan Note (Signed)
Continue supplementation  ?

## 2017-07-04 NOTE — Progress Notes (Signed)
Patient ID: Michelle Torres, female    DOB: May 18, 1960, 57 y.o.   MRN: [id number]  PCP: Harrison Mons, PA-C  Chief Complaint  Patient presents with  . Gynecologic Exam    labs, immunization  . Medication Refill    zofran, phenergan, relpax, flonase     Subjective:   Presents for cervical cancer screening, medication refills, and lab update regarding diabetes, hypothyroidism, treatment of bipolar disorder, hyperlipidemia.  She has multiple chronic medical problems, generally managed by specialists. Psychiatry visit next week. Needs Depakote level and monitoring for long-term use of that drug. Last A1C with endocrinology was >12 weeks ago.  Saw her grandmother, probably for the last time. "Messing with me." Migraine yesterday. Today feels a little dizzy, very fatigued, GI upset. "Wet washcloth," "Out of it," "not myself." Feels like she needs a few more days off work to recover.  Heard that she received the "wrong" measles vaccine and needs another.   Health Maintenance Due  Topic Date Due  . PAP SMEAR  02/02/2017  . HEMOGLOBIN A1C  06/30/2017     Review of Systems As above. No CP, SOB, muscle/joint pain.    Patient Active Problem List   Diagnosis Date Noted  . Nausea and vomiting 08/28/2016  . Elevated transaminase level 08/09/2016  . Mixed dyslipidemia 08/09/2016  . GERD (gastroesophageal reflux disease) 02/15/2014  . IBS (irritable bowel syndrome) 02/15/2014  . Osteopenia 02/08/2014  . Ocular myasthenia gravis ([place]) 10/07/2013  . Vitamin D deficiency 08/27/2013  . Primary hypothyroidism 08/27/2013  . Migraine 08/14/2013  . Bipolar 2 disorder ([place]) 03/05/2011  . Type 2 diabetes mellitus with hyperglycemia, with long-term current use of insulin ([place]) 03/05/2011  . Fatty infiltration of liver 03/02/2011     Prior to Admission medications   Medication Sig Start Date End Date Taking? Authorizing Provider  alosetron (LOTRONEX) 1 MG tablet Take 0.5 tablets  (0.5 mg total) by mouth 2 (two) times daily. 03/19/17  Yes Ekam Bonebrake, PA-C  azaTHIOprine (IMURAN) 50 MG tablet Take 1 tablet (50 mg total) by mouth 3 (three) times daily. 03/19/17  Yes Darvell Monteforte, PA-C  azelastine (ASTELIN) 0.1 % nasal spray Place 2 sprays into both nostrils 2 (two) times daily. Use in each nostril as directed 07/24/16  Yes Naika Noto, PA-C  calcium carbonate 1250 MG capsule Take 1,000 mg by mouth daily.   Yes [provider]  cetirizine (ZYRTEC) 10 MG tablet Take 10 mg by mouth daily.   Yes [provider]  cholecalciferol (VITAMIN D) 1000 UNITS tablet Take 5,000 Units by mouth daily.   Yes [provider]  clonazePAM (KLONOPIN) 1 MG tablet Take 3 mg by mouth at bedtime. May take 1/2 to 1 tablet during the day as needed    Yes [provider]  divalproex (DEPAKOTE ER) 500 MG 24 hr tablet Take 1,500 mg by mouth at bedtime.    Yes [provider]  eletriptan (RELPAX) 40 MG tablet Take 1 tablet (40 mg total) by mouth as needed for migraine. 1 tab PO at onset of headache Repeat in 2 hours if headache persists or recurs 05/09/17  Yes Ankush Gintz, PA-C  empagliflozin (JARDIANCE) 10 MG TABS tablet Take 10 mg by mouth daily.   Yes [provider]  fluticasone (FLONASE) 50 MCG/ACT nasal spray Place 2 sprays into the nose as needed for rhinitis.   Yes [provider]  gabapentin (NEURONTIN) 600 MG tablet Take 600 mg by mouth at bedtime.  Yes [provider]  Insulin Lispro (HUMALOG KWIKPEN) 200 UNIT/ML SOPN Inject into the skin.   Yes [provider]  lamoTRIgine (LAMICTAL) 100 MG tablet Take 200 mg by mouth every morning.    Yes [provider]  levothyroxine (SYNTHROID, LEVOTHROID) 75 MCG tablet Take 75 mcg by mouth daily before breakfast.   Yes [provider]  loperamide (IMODIUM) 2 MG capsule Take by mouth as needed for diarrhea or loose stools.   Yes [provider]  Melatonin 10 MG CAPS Take by mouth.   Yes [provider]  naproxen (NAPROSYN) 500 MG tablet Take 500 mg by mouth 2 (two) times daily as needed (for pain).    Yes [provider]  NEEDLE, DISP, 30 G 30G X 3/4" MISC Use as directed, daily blood sugars ( please dispense what patient had previously) 07/14/13  Yes Le, Thao P, DO  omeprazole (PRILOSEC) 40 MG capsule Take 1 capsule (40 mg total) by mouth daily. 02/08/14  Yes Abeer Iversen, PA-C  ondansetron (ZOFRAN-ODT) 8 MG disintegrating tablet Take 0.5-1 tablets (4-8 mg total) by mouth every 8 (eight) hours as needed for nausea. 05/09/17  Yes Lillee Mooneyhan, Domingo Mend, PA-C  ONETOUCH VERIO test strip  01/25/14  Yes [provider]  OXcarbazepine (TRILEPTAL) 150 MG tablet Take 150 mg by mouth at bedtime.    Yes [provider]  pravastatin (PRAVACHOL) 40 MG tablet Take 40 mg by mouth daily.   Yes [provider]  pregabalin (LYRICA) 75 MG capsule Take 75 mg by mouth every morning.    Yes [provider]  promethazine (PHENERGAN) 25 MG tablet Take 1 tablet (25 mg total) by mouth every 8 (eight) hours as needed for nausea or vomiting. 10/16/16  Yes Rutherford Guys, MD  sertraline (ZOLOFT) 25 MG tablet Take 50 mg by mouth daily.   Yes [provider]  topiramate (TOPAMAX) 50 MG tablet Take 1 tablet (50 mg total) by mouth 2 (two) times daily. 03/27/16  Yes Harrison Mons, PA-C  TRESIBA FLEXTOUCH 200 UNIT/ML SOPN  07/02/17  Yes [provider]  TRULICITY 1.5 LA/4.5XM SOPN INJECT 1.5 MG WEEKLY FOR DIABETES 09/22/14  Yes [provider]  magic mouthwash w/lidocaine SOLN Take 10 mLs by mouth every 2 (two) hours as needed for mouth pain. Patient not taking: Reported on 07/04/2017 05/09/17   Harrison Mons, PA-C     Allergies  Allergen Reactions  . Pyridostigmine Bromide Nausea And Vomiting    "violently ill" "violently ill"  . Risperidone Other (See Comments)    liver damage "enlarged  my liver"  . Risperidone And Related Other (See Comments)    liver damage       Objective:  Physical Exam  Constitutional: She is oriented to person, place, and time. She appears well-developed and well-nourished. She is active and cooperative. No distress.  BP 114/70   Pulse 80   Temp 97.8 F (36.6 C)   Resp 16   Ht 5' 1.14" (1.553 m)   Wt 191 lb 3.2 oz (86.7 kg)   SpO2 96%   BMI 35.96 kg/m   HENT:  Head: Normocephalic and atraumatic.  Right Ear: Hearing normal.  Left Ear: Hearing normal.  Eyes: Conjunctivae are normal. No scleral icterus.  Neck: Normal range of motion. Neck supple. No thyromegaly present.  Cardiovascular: Normal rate, regular rhythm and normal heart sounds.  Pulses:      Radial pulses are 2+ on the right side, and 2+ on the  left side.  Pulmonary/Chest: Effort normal and breath sounds normal. Right breast exhibits no inverted nipple, no mass, no nipple discharge, no skin change and no tenderness. Left breast exhibits no inverted nipple, no mass, no nipple discharge, no skin change and no tenderness. No breast swelling, tenderness, discharge or bleeding. Breasts are symmetrical.  Abdominal: Hernia confirmed negative in the right inguinal area and confirmed negative in the left inguinal area.  Genitourinary: Pelvic exam was performed with patient supine. No labial fusion. There is no rash, tenderness, lesion or injury on the right labia. There is no rash, tenderness, lesion or injury on the left labia.  Lymphadenopathy:       Head (right side): No tonsillar, no preauricular, no posterior auricular and no occipital adenopathy present.       Head (left side): No tonsillar, no preauricular, no posterior auricular and no occipital adenopathy present.    She has no cervical adenopathy. No inguinal adenopathy noted on the right or left side.       Right: No supraclavicular adenopathy present.       Left: No supraclavicular adenopathy present.  Neurological: She is alert  and oriented to person, place, and time. No sensory deficit.  Skin: Skin is warm, dry and intact. No rash noted. No cyanosis or erythema. Nails show no clubbing.  Psychiatric: She has a normal mood and affect. Her speech is normal and behavior is normal.    Wt Readings from Last 3 Encounters:  07/04/17 191 lb 3.2 oz (86.7 kg)  05/27/17 191 lb (86.6 kg)  05/09/17 195 lb 3.2 oz (88.5 kg)         Assessment & Plan:   Problem List Items Addressed This Visit    Migraine (Chronic)    Continue topiramate and PRN Relpax and anti-nausea remedies. OOW note beginning yesterday, to return Monday 07/08/17.      Relevant Medications   eletriptan (RELPAX) 40 MG tablet   ondansetron (ZOFRAN-ODT) 8 MG disintegrating tablet   promethazine (PHENERGAN) 25 MG tablet   Bipolar 2 disorder (HCC)    Update labs, and will forward results to psychiatry. Proceed with visit next week, as planned.      Relevant Orders   CBC with Differential/Platelet   Valproic acid level   Comprehensive metabolic panel   Type 2 diabetes mellitus with hyperglycemia, with long-term current use of insulin ([place]) - Primary    Managed by endocrinology, but overdue for A1C. Update that and urine microalbumin today. Continue annual eye exams and daily foot checks.      Relevant Medications   TRESIBA FLEXTOUCH 200 UNIT/ML SOPN   Other Relevant Orders   Hemoglobin A1c   Microalbumin / creatinine urine ratio   Comprehensive metabolic panel   Vitamin D deficiency    Continue supplementation.      Primary hypothyroidism    Update TSH today. Adjust levothyroxine dose if indicated.      Relevant Orders   TSH   Ocular myasthenia gravis ([place])    Continue per specialty recommendatuibs.      Elevated transaminase level    Update labs today.      Relevant Orders   Comprehensive metabolic panel   Mixed dyslipidemia    Await labs. Adjust regimen as indicated by results. Goal LDL <70      Relevant Orders   Lipid  panel   Comprehensive metabolic panel    Other Visit Diagnoses    Nasal congestion       Relevant Medications  fluticasone (FLONASE) 50 MCG/ACT nasal spray   Need for MMR vaccine       Relevant Orders   MMR vaccine subcutaneous   Screening for cervical cancer       Relevant Orders   Pap IG and HPV (high risk) DNA detection       Return in about 3 months (around 10/04/2017) for re-evaluation of multiple chronic medical problems, sooner if needed.   Fara Chute, PA-C [facility]

## 2017-07-04 NOTE — Assessment & Plan Note (Signed)
Managed by endocrinology, but overdue for A1C. Update that and urine microalbumin today. Continue annual eye exams and daily foot checks.

## 2017-07-04 NOTE — Assessment & Plan Note (Signed)
Await labs. Adjust regimen as indicated by results. Goal LDL <70 

## 2017-07-04 NOTE — Assessment & Plan Note (Signed)
Update labs, and will forward results to psychiatry. Proceed with visit next week, as planned.

## 2017-07-04 NOTE — Patient Instructions (Addendum)
Go ahead and call [facility] to schedule your next visit with me there. [phone].  [facility]  IF you received an x-ray today, you will receive an invoice from [facility]. Please contact [facility] at [phone] with questions or concerns regarding your invoice.   IF you received labwork today, you will receive an invoice from [facility]. Please contact [facility] at [phone] with questions or concerns regarding your invoice.   Our billing staff will not be able to assist you with questions regarding bills from these companies.  You will be contacted with the lab results as soon as they are available. The fastest way to get your results is to activate your My Chart account. Instructions are located on the last page of this paperwork. If you have not heard from [place] regarding the results in 2 weeks, please contact this office.

## 2017-07-04 NOTE — Assessment & Plan Note (Signed)
Update labs today 

## 2017-07-04 NOTE — Assessment & Plan Note (Signed)
Continue per specialty recommendatuibs.

## 2017-07-04 NOTE — Assessment & Plan Note (Signed)
Update TSH today. Adjust levothyroxine dose if indicated.

## 2017-07-04 NOTE — Assessment & Plan Note (Signed)
Continue topiramate and PRN Relpax and anti-nausea remedies. OOW note beginning yesterday, to return Monday 07/08/17.

## 2017-07-05 LAB — COMPREHENSIVE METABOLIC PANEL
A/G RATIO: 1.6 (ref 1.2–2.2)
ALBUMIN: 4.4 g/dL (ref 3.5–5.5)
ALT: 22 IU/L (ref 0–32)
AST: 23 IU/L (ref 0–40)
Alkaline Phosphatase: 91 IU/L (ref 39–117)
BILIRUBIN TOTAL: 0.3 mg/dL (ref 0.0–1.2)
BUN / CREAT RATIO: 16 (ref 9–23)
BUN: 13 mg/dL (ref 6–24)
CALCIUM: 9.7 mg/dL (ref 8.7–10.2)
CHLORIDE: 102 mmol/L (ref 96–106)
CO2: 21 mmol/L (ref 20–29)
Creatinine, Ser: 0.79 mg/dL (ref 0.57–1.00)
GFR calc non Af Amer: 85 mL/min/{1.73_m2} (ref >59)
GFR, EST AFRICAN AMERICAN: 97 mL/min/{1.73_m2} (ref >59)
GLOBULIN, TOTAL: 2.7 g/dL (ref 1.5–4.5)
Glucose: 124 mg/dL — ABNORMAL HIGH (ref 65–99)
POTASSIUM: 4.3 mmol/L (ref 3.5–5.2)
SODIUM: 139 mmol/L (ref 134–144)
Total Protein: 7.1 g/dL (ref 6.0–8.5)

## 2017-07-05 LAB — LIPID PANEL
CHOL/HDL RATIO: 3 ratio (ref 0.0–4.4)
Cholesterol, Total: 158 mg/dL (ref 100–199)
HDL: 52 mg/dL (ref >39)
LDL Calculated: 79 mg/dL (ref 0–99)
Triglycerides: 135 mg/dL (ref 0–149)
VLDL Cholesterol Cal: 27 mg/dL (ref 5–40)

## 2017-07-05 LAB — CBC WITH DIFFERENTIAL/PLATELET
BASOS: 1 %
Basophils Absolute: 0.1 10*3/uL (ref 0.0–0.2)
EOS (ABSOLUTE): 0.2 10*3/uL (ref 0.0–0.4)
Eos: 2 %
HEMOGLOBIN: 13.8 g/dL (ref 11.1–15.9)
Hematocrit: 41.7 % (ref 34.0–46.6)
IMMATURE GRANS (ABS): 0 10*3/uL (ref 0.0–0.1)
Immature Granulocytes: 0 %
LYMPHS ABS: 3.6 10*3/uL — AB (ref 0.7–3.1)
LYMPHS: 43 %
MCH: 29.5 pg (ref 26.6–33.0)
MCHC: 33.1 g/dL (ref 31.5–35.7)
MCV: 89 fL (ref 79–97)
MONOCYTES: 8 %
Monocytes Absolute: 0.7 10*3/uL (ref 0.1–0.9)
Neutrophils Absolute: 3.9 10*3/uL (ref 1.4–7.0)
Neutrophils: 46 %
Platelets: 355 10*3/uL (ref 150–379)
RBC: 4.68 x10E6/uL (ref 3.77–5.28)
RDW: 16.1 % — ABNORMAL HIGH (ref 12.3–15.4)
WBC: 8.3 10*3/uL (ref 3.4–10.8)

## 2017-07-05 LAB — MICROALBUMIN / CREATININE URINE RATIO
CREATININE, UR: 96.1 mg/dL
MICROALBUM., U, RANDOM: 3.5 ug/mL
Microalb/Creat Ratio: 3.6 mg/g creat (ref 0.0–30.0)

## 2017-07-05 LAB — HEMOGLOBIN A1C
ESTIMATED AVERAGE GLUCOSE: 163 mg/dL
Hgb A1c MFr Bld: 7.3 % — ABNORMAL HIGH (ref 4.8–5.6)

## 2017-07-05 LAB — TSH: TSH: 0.83 u[IU]/mL (ref 0.450–4.500)

## 2017-07-05 LAB — VALPROIC ACID LEVEL: VALPROIC ACID LVL: 32 ug/mL — AB (ref 50–100)

## 2017-07-09 ENCOUNTER — Ambulatory Visit: Payer: BC Managed Care – PPO | Admitting: Physician Assistant

## 2017-07-09 LAB — PAP IG AND HPV HIGH-RISK
HPV, high-risk: NEGATIVE
PAP Smear Comment: 0

## 2017-07-16 ENCOUNTER — Ambulatory Visit: Payer: BC Managed Care – PPO | Admitting: Physician Assistant

## 2017-07-16 ENCOUNTER — Other Ambulatory Visit: Payer: Self-pay

## 2017-07-16 ENCOUNTER — Encounter: Payer: Self-pay | Admitting: Physician Assistant

## 2017-07-16 VITALS — BP 128/88 | HR 74 | Temp 97.6°F | Resp 16 | Ht 61.0 in | Wt 187.6 lb

## 2017-07-16 DIAGNOSIS — M542 Cervicalgia: Secondary | ICD-10-CM | POA: Diagnosis not present

## 2017-07-16 DIAGNOSIS — G43919 Migraine, unspecified, intractable, without status migrainosus: Secondary | ICD-10-CM

## 2017-07-16 MED ORDER — CYCLOBENZAPRINE HCL 10 MG PO TABS: 10.0000 mg | ORAL_TABLET | Freq: Three times a day (TID) | ORAL | 0 refills | Status: DC | PRN

## 2017-07-16 MED ORDER — KETOROLAC TROMETHAMINE 60 MG/2ML IM SOLN
60.0000 mg | Freq: Once | INTRAMUSCULAR | Status: AC
  Administered 2017-07-16: 60 mg via INTRAMUSCULAR

## 2017-07-16 NOTE — Progress Notes (Signed)
Patient ID: Berton Lan, female    DOB: 05-05-1960, 57 y.o.   MRN: [id number]  PCP: Harrison Mons, PA-C  Chief Complaint  Patient presents with  . Migraine    on right side radiates down neck started yesterday and got worse around 2 am     Subjective:   Presents for evaluation of migraine headache that woke her from sleep at 2 AM today.  This is her typical migraine except for feeling of stiffness on the right side of her neck and a sensation of being "off balance."  She is taken a dose of ondansetron and Relpax and had some improvement in her headache.  The sensation of being off balance has not improved.  She has applied Biofreeze to the right side of her neck and the right shoulder which has helped those symptoms but not provided complete resolution.  No trigger for this particular headache more for the neck symptoms.  She does note that she experienced acid reflux yesterday evening at about 7 PM after eating chicken and rice but that resolved.  Treated here for migraine 07/04/2017. She was improving, so did not receive clinic-administered medication at that visit. Last visit for clinic-administered treatment was 05/27/2017, when she received ketorolac 60 mg IM.    Review of Systems Constitutional: Negative for chills and fever.  HENT: Negative for ear pain, hearing loss, sore throat and tinnitus.   Eyes: Positive for photophobia. Negative for visual disturbance.  Respiratory: Negative for cough and shortness of breath.   Cardiovascular: Negative.   Gastrointestinal: Positive for abdominal pain and nausea. Negative for constipation, diarrhea and vomiting.  Endocrine: Negative for polyuria.  Genitourinary: Negative for frequency.  Neurological: Positive for dizziness and headaches. Negative for weakness and numbness.       Patient Active Problem List   Diagnosis Date Noted  . Nausea and vomiting 08/28/2016  . Elevated transaminase level 08/09/2016  . Mixed  dyslipidemia 08/09/2016  . GERD (gastroesophageal reflux disease) 02/15/2014  . IBS (irritable bowel syndrome) 02/15/2014  . Osteopenia 02/08/2014  . Ocular myasthenia gravis ([place]) 10/07/2013  . Vitamin D deficiency 08/27/2013  . Primary hypothyroidism 08/27/2013  . Migraine 08/14/2013  . Bipolar 2 disorder ([place]) 03/05/2011  . Type 2 diabetes mellitus with hyperglycemia, with long-term current use of insulin ([place]) 03/05/2011  . Fatty infiltration of liver 03/02/2011     Prior to Admission medications   Medication Sig Start Date End Date Taking? Authorizing Provider  alosetron (LOTRONEX) 1 MG tablet Take 0.5 tablets (0.5 mg total) by mouth 2 (two) times daily. 03/19/17  Yes Domino Holten, PA-C  azaTHIOprine (IMURAN) 50 MG tablet Take 1 tablet (50 mg total) by mouth 3 (three) times daily. 03/19/17  Yes Enedina Pair, PA-C  azelastine (ASTELIN) 0.1 % nasal spray Place 2 sprays into both nostrils 2 (two) times daily. Use in each nostril as directed 07/24/16  Yes Saadiya Wilfong, PA-C  calcium carbonate 1250 MG capsule Take 1,000 mg by mouth daily.   Yes [provider]  cetirizine (ZYRTEC) 10 MG tablet Take 10 mg by mouth daily.   Yes [provider]  cholecalciferol (VITAMIN D) 1000 UNITS tablet Take 5,000 Units by mouth daily.   Yes [provider]  clonazePAM (KLONOPIN) 1 MG tablet Take 3 mg by mouth at bedtime. May take 1/2 to 1 tablet during the day as needed    Yes [provider]  divalproex (DEPAKOTE ER) 500 MG 24 hr tablet Take 1,500 mg by  mouth at bedtime.    Yes [provider]  eletriptan (RELPAX) 40 MG tablet Take 1 tablet (40 mg total) by mouth as needed for migraine. 1 tab PO at onset of headache Repeat in 2 hours if headache persists or recurs 07/04/17  Yes Fannye Myer, PA-C  empagliflozin (JARDIANCE) 10 MG TABS tablet Take 10 mg by mouth daily.   Yes [provider]  fluticasone (FLONASE) 50 MCG/ACT nasal spray Place 2  sprays into both nostrils daily. 07/04/17  Yes Synthia Fairbank, PA-C  gabapentin (NEURONTIN) 600 MG tablet Take 600 mg by mouth at bedtime.     Yes [provider]  Insulin Lispro (HUMALOG KWIKPEN) 200 UNIT/ML SOPN Inject into the skin.   Yes [provider]  lamoTRIgine (LAMICTAL) 100 MG tablet Take 200 mg by mouth every morning.    Yes [provider]  levothyroxine (SYNTHROID, LEVOTHROID) 75 MCG tablet Take 75 mcg by mouth daily before breakfast.   Yes [provider]  loperamide (IMODIUM) 2 MG capsule Take by mouth as needed for diarrhea or loose stools.   Yes [provider]  Melatonin 10 MG CAPS Take by mouth.   Yes [provider]  naproxen (NAPROSYN) 500 MG tablet Take 500 mg by mouth 2 (two) times daily as needed (for pain).    Yes [provider]  NEEDLE, DISP, 30 G 30G X 3/4" MISC Use as directed, daily blood sugars ( please dispense what patient had previously) 07/14/13  Yes Le, Thao P, DO  omeprazole (PRILOSEC) 40 MG capsule Take 1 capsule (40 mg total) by mouth daily. 02/08/14  Yes Adi Seales, PA-C  ondansetron (ZOFRAN-ODT) 8 MG disintegrating tablet Take 0.5-1 tablets (4-8 mg total) by mouth every 8 (eight) hours as needed for nausea. 07/04/17  Yes Harrison Mons, PA-C  ONETOUCH VERIO test strip  01/25/14  Yes [provider]  OXcarbazepine (TRILEPTAL) 150 MG tablet Take 150 mg by mouth at bedtime.    Yes [provider]  pravastatin (PRAVACHOL) 40 MG tablet Take 40 mg by mouth daily.   Yes [provider]  pregabalin (LYRICA) 75 MG capsule Take 75 mg by mouth every morning.    Yes [provider]  promethazine (PHENERGAN) 25 MG tablet Take 1 tablet (25 mg total) by mouth every 8 (eight) hours as needed for nausea or vomiting. 07/04/17  Yes Akai Dollard, PA-C  sertraline (ZOLOFT) 25 MG tablet Take 50 mg by mouth daily.   Yes [provider]  topiramate (TOPAMAX) 50 MG tablet  Take 1 tablet (50 mg total) by mouth 2 (two) times daily. 03/27/16  Yes Harrison Mons, PA-C  TRESIBA FLEXTOUCH 200 UNIT/ML SOPN  07/02/17  Yes [provider]  TRULICITY 1.5 VO/5.3GU SOPN INJECT 1.5 MG WEEKLY FOR DIABETES 09/22/14  Yes [provider]     Allergies  Allergen Reactions  . Pyridostigmine Bromide Nausea And Vomiting    "violently ill" "violently ill"  . Risperidone Other (See Comments)    liver damage "enlarged my liver"  . Metformin And Related     Severe GI distress  . Risperidone And Related Other (See Comments)    liver damage       Objective:  Physical Exam  Constitutional: She is oriented to person, place, and time. She appears well-developed and well-nourished. She is active and cooperative. No distress.  BP 128/88   Pulse 74   Temp 97.6 F (36.4 C)   Resp 16   Ht 5\' 1"  (  1.549 m)   Wt 187 lb 9.6 oz (85.1 kg)   SpO2 98%   BMI 35.45 kg/m   HENT:  Head: Normocephalic and atraumatic.  Right Ear: Hearing normal.  Left Ear: Hearing normal.  Eyes: Pupils are equal, round, and reactive to light. Conjunctivae, EOM and lids are normal. No scleral icterus.  Fundoscopic exam:      The right eye shows no exudate, no hemorrhage and no papilledema. The right eye shows red reflex.       The left eye shows no exudate, no hemorrhage and no papilledema. The left eye shows red reflex.  Neck: Normal range of motion. Neck supple. No thyromegaly present.  Cardiovascular: Normal rate, regular rhythm and normal heart sounds.  Pulses:      Radial pulses are 2+ on the right side, and 2+ on the left side.  Pulmonary/Chest: Effort normal and breath sounds normal.  Lymphadenopathy:       Head (right side): No tonsillar, no preauricular, no posterior auricular and no occipital adenopathy present.       Head (left side): No tonsillar, no preauricular, no posterior auricular and no occipital adenopathy present.    She has no cervical adenopathy.       Right: No  supraclavicular adenopathy present.       Left: No supraclavicular adenopathy present.  Neurological: She is alert and oriented to person, place, and time. She displays normal reflexes. No cranial nerve deficit or sensory deficit. She exhibits normal muscle tone. Coordination normal.  Skin: Skin is warm, dry and intact. No rash noted. No cyanosis or erythema. Nails show no clubbing.  Psychiatric: She has a normal mood and affect. Her speech is normal and behavior is normal.       Assessment & Plan:   Problem List Items Addressed This Visit    Migraine - Primary (Chronic)    She has done well previously with ketorolac injection.  She has ondansetron at home, will continue to use that as needed for nausea.  Reminded that triptans work best when a dose is taken with onset of symptoms and repeated in 2 hours if any pain persists.      Relevant Medications   ketorolac (TORADOL) injection 60 mg (Completed)   cyclobenzaprine (FLEXERIL) 10 MG tablet    Other Visit Diagnoses    Neck pain       Unclear etiology, may have triggered her migraine.  Cyclobenzaprine.  Anticipatory guidance provided.   Relevant Medications   cyclobenzaprine (FLEXERIL) 10 MG tablet       No follow-ups on file.   Fara Chute, PA-C [facility]

## 2017-07-16 NOTE — Progress Notes (Signed)
Subjective:    Patient ID: Michelle Torres, female    DOB: 1960-12-09, 57 y.o.   MRN: [id number]  Patient presents for 1 day history of migraine. Last night at 2am, patient awoke with migraine, stiffness on the right side of neck, and dizziness described as being "off balance." She took zofran and relpax which partially alleviated the symptoms except the non-triggered dizziness which has remained constant. She also applied biofreeze to the right side of her neck and shoulder which helped but did not bring complete relief.  Of note, patient describes having acid reflux at 7pm after eating chicken and rice, but this was relieved after going to sleep.     Review of Systems  Constitutional: Negative for chills and fever.  HENT: Negative for ear pain, hearing loss, sore throat and tinnitus.   Eyes: Positive for photophobia. Negative for visual disturbance.  Respiratory: Negative for cough and shortness of breath.   Cardiovascular: Negative.   Gastrointestinal: Positive for abdominal pain and nausea. Negative for constipation, diarrhea and vomiting.  Endocrine: Negative for polyuria.  Genitourinary: Negative for frequency.  Neurological: Positive for dizziness and headaches. Negative for weakness and numbness.        Patient Active Problem List   Diagnosis Date Noted  . Nausea and vomiting 08/28/2016  . Elevated transaminase level 08/09/2016  . Mixed dyslipidemia 08/09/2016  . GERD (gastroesophageal reflux disease) 02/15/2014  . IBS (irritable bowel syndrome) 02/15/2014  . Osteopenia 02/08/2014  . Ocular myasthenia gravis ([place]) 10/07/2013  . Vitamin D deficiency 08/27/2013  . Primary hypothyroidism 08/27/2013  . Migraine 08/14/2013  . Bipolar 2 disorder ([place]) 03/05/2011  . Type 2 diabetes mellitus with hyperglycemia, with long-term current use of insulin ([place]) 03/05/2011  . Fatty infiltration of liver 03/02/2011   Prior to Admission medications   Medication Sig Start Date End Date  Taking? Authorizing Provider  alosetron (LOTRONEX) 1 MG tablet Take 0.5 tablets (0.5 mg total) by mouth 2 (two) times daily. 03/19/17  Yes Jeffery, Chelle, PA-C  azaTHIOprine (IMURAN) 50 MG tablet Take 1 tablet (50 mg total) by mouth 3 (three) times daily. 03/19/17  Yes Jeffery, Chelle, PA-C  azelastine (ASTELIN) 0.1 % nasal spray Place 2 sprays into both nostrils 2 (two) times daily. Use in each nostril as directed 07/24/16  Yes Jeffery, Chelle, PA-C  calcium carbonate 1250 MG capsule Take 1,000 mg by mouth daily.   Yes [provider]  cetirizine (ZYRTEC) 10 MG tablet Take 10 mg by mouth daily.   Yes [provider]  cholecalciferol (VITAMIN D) 1000 UNITS tablet Take 5,000 Units by mouth daily.   Yes [provider]  clonazePAM (KLONOPIN) 1 MG tablet Take 3 mg by mouth at bedtime. May take 1/2 to 1 tablet during the day as needed    Yes [provider]  divalproex (DEPAKOTE ER) 500 MG 24 hr tablet Take 1,500 mg by mouth at bedtime.    Yes [provider]  eletriptan (RELPAX) 40 MG tablet Take 1 tablet (40 mg total) by mouth as needed for migraine. 1 tab PO at onset of headache Repeat in 2 hours if headache persists or recurs 07/04/17  Yes Jeffery, Chelle, PA-C  empagliflozin (JARDIANCE) 10 MG TABS tablet Take 10 mg by mouth daily.   Yes [provider]  fluticasone (FLONASE) 50 MCG/ACT nasal spray Place 2 sprays into both nostrils daily. 07/04/17  Yes Jeffery, Chelle, PA-C  gabapentin (NEURONTIN) 600 MG tablet Take 600 mg by mouth at bedtime.  Yes [provider]  Insulin Lispro (HUMALOG KWIKPEN) 200 UNIT/ML SOPN Inject into the skin.   Yes [provider]  lamoTRIgine (LAMICTAL) 100 MG tablet Take 200 mg by mouth every morning.    Yes [provider]  levothyroxine (SYNTHROID, LEVOTHROID) 75 MCG tablet Take 75 mcg by mouth daily before breakfast.   Yes [provider]  loperamide (IMODIUM) 2 MG capsule Take by  mouth as needed for diarrhea or loose stools.   Yes [provider]  Melatonin 10 MG CAPS Take by mouth.   Yes [provider]  naproxen (NAPROSYN) 500 MG tablet Take 500 mg by mouth 2 (two) times daily as needed (for pain).    Yes [provider]  NEEDLE, DISP, 30 G 30G X 3/4" MISC Use as directed, daily blood sugars ( please dispense what patient had previously) 07/14/13  Yes Le, Thao P, DO  omeprazole (PRILOSEC) 40 MG capsule Take 1 capsule (40 mg total) by mouth daily. 02/08/14  Yes Jeffery, Chelle, PA-C  ondansetron (ZOFRAN-ODT) 8 MG disintegrating tablet Take 0.5-1 tablets (4-8 mg total) by mouth every 8 (eight) hours as needed for nausea. 07/04/17  Yes Harrison Mons, PA-C  ONETOUCH VERIO test strip  01/25/14  Yes [provider]  OXcarbazepine (TRILEPTAL) 150 MG tablet Take 150 mg by mouth at bedtime.    Yes [provider]  pravastatin (PRAVACHOL) 40 MG tablet Take 40 mg by mouth daily.   Yes [provider]  pregabalin (LYRICA) 75 MG capsule Take 75 mg by mouth every morning.    Yes [provider]  promethazine (PHENERGAN) 25 MG tablet Take 1 tablet (25 mg total) by mouth every 8 (eight) hours as needed for nausea or vomiting. 07/04/17  Yes Jeffery, Chelle, PA-C  sertraline (ZOLOFT) 25 MG tablet Take 50 mg by mouth daily.   Yes [provider]  topiramate (TOPAMAX) 50 MG tablet Take 1 tablet (50 mg total) by mouth 2 (two) times daily. 03/27/16  Yes Harrison Mons, PA-C  TRESIBA FLEXTOUCH 200 UNIT/ML SOPN  07/02/17  Yes [provider]  TRULICITY 1.5 ME/2.6ST SOPN INJECT 1.5 MG WEEKLY FOR DIABETES 09/22/14  Yes [provider]  cyclobenzaprine (FLEXERIL) 10 MG tablet Take 1 tablet (10 mg total) by mouth 3 (three) times daily as needed for muscle spasms. 07/16/17   Harrison Mons, PA-C   Allergies  Allergen Reactions  . Pyridostigmine Bromide Nausea And Vomiting    "violently ill" "violently ill"  .  Risperidone Other (See Comments)    liver damage "enlarged my liver"  . Metformin And Related     Severe GI distress  . Risperidone And Related Other (See Comments)    liver damage    Objective:   Physical Exam  Eyes: Pupils are equal, round, and reactive to light. Conjunctivae are normal.  Neck: No thyromegaly present.  Cardiovascular: Normal rate, regular rhythm and normal heart sounds. Exam reveals no gallop and no friction rub.  No murmur heard. Pulmonary/Chest: Effort normal and breath sounds normal.  Abdominal: Soft. Bowel sounds are normal. There is no guarding.  Musculoskeletal: Normal range of motion.  Lymphadenopathy:    She has no cervical adenopathy.  Neurological: She is alert. She displays normal reflexes. No cranial nerve deficit. She exhibits normal muscle tone.          Assessment & Plan:  1. Intractable migraine without status migrainosus, unspecified migraine type - Reminded correct dosing of relpax: at onset of headache take 1  tablet, then take 1 more tablet 2 hours later if migraine persists. No more than 2 tablets in 24 hours.  - ketorolac (TORADOL) injection 60 mg  2. Neck pain - cyclobenzaprine (FLEXERIL) 10 MG tablet; Take 1 tablet (10 mg total) by mouth 3 (three) times daily as needed for muscle spasms.  Dispense: 30 tablet; Refill: 0 - follow up with office in one week if unrelieved with flexeril

## 2017-07-16 NOTE — Patient Instructions (Addendum)
You have the Zofran for nausea. Remember to take a second dose of Relpax 2 hours after the first, if you have any remaining headache.  If the neck pain persists, use the cyclobenzaprine (Flexeril).    IF you received an x-ray today, you will receive an invoice from [facility]. Please contact [facility] at [phone] with questions or concerns regarding your invoice.   IF you received labwork today, you will receive an invoice from [place]. Please contact [facility] at [phone] with questions or concerns regarding your invoice.   Our billing staff will not be able to assist you with questions regarding bills from these companies.  You will be contacted with the lab results as soon as they are available. The fastest way to get your results is to activate your My Chart account. Instructions are located on the last page of this paperwork. If you have not heard from [place] regarding the results in 2 weeks, please contact this office.

## 2017-07-22 ENCOUNTER — Encounter: Payer: Self-pay | Admitting: Physician Assistant

## 2017-07-22 ENCOUNTER — Other Ambulatory Visit: Payer: Self-pay

## 2017-07-22 ENCOUNTER — Ambulatory Visit: Payer: BC Managed Care – PPO | Admitting: Physician Assistant

## 2017-07-22 VITALS — BP 107/73 | HR 86 | Temp 97.6°F | Ht 60.83 in | Wt 187.2 lb

## 2017-07-22 DIAGNOSIS — G43009 Migraine without aura, not intractable, without status migrainosus: Secondary | ICD-10-CM | POA: Diagnosis not present

## 2017-07-22 DIAGNOSIS — R112 Nausea with vomiting, unspecified: Secondary | ICD-10-CM

## 2017-07-22 LAB — GLUCOSE, POCT (MANUAL RESULT ENTRY): POC Glucose: 124 mg/dl — AB (ref 70–99)

## 2017-07-22 MED ORDER — PROMETHAZINE HCL 25 MG/ML IJ SOLN
25.0000 mg | Freq: Once | INTRAMUSCULAR | Status: AC
  Administered 2017-07-22: 25 mg via INTRAMUSCULAR

## 2017-07-22 MED ORDER — KETOROLAC TROMETHAMINE 60 MG/2ML IM SOLN
60.0000 mg | Freq: Once | INTRAMUSCULAR | Status: AC
  Administered 2017-07-22: 60 mg via INTRAMUSCULAR

## 2017-07-22 NOTE — Patient Instructions (Addendum)
I recommend going home and resting. Try to drink some water. If you cannot tolerate water, sip on ginger ale. Eat a bland diet (stick with banana, apple, rice, toast) and advance diet as tolerated. If you start to have any worsening symptoms or new concerning symptoms like fever, focal abdominal pain, or blood/mucus in your stools, please seek care immediately.     Food Choices to Help Relieve Diarrhea, Adult When you have diarrhea, the foods you eat and your eating habits are very important. Choosing the right foods and drinks can help:  Relieve diarrhea.  Replace lost fluids and nutrients.  Prevent dehydration.  What general guidelines should I follow? Relieving diarrhea  Choose foods with less than 2 g or .07 oz. of fiber per serving.  Limit fats to less than 8 tsp (38 g or 1.34 oz.) a day.  Avoid the following: ? Foods and beverages sweetened with high-fructose corn syrup, honey, or sugar alcohols such as xylitol, sorbitol, and mannitol. ? Foods that contain a lot of fat or sugar. ? Fried, greasy, or spicy foods. ? High-fiber grains, breads, and cereals. ? Raw fruits and vegetables.  Eat foods that are rich in probiotics. These foods include dairy products such as yogurt and fermented milk products. They help increase healthy bacteria in the stomach and intestines (gastrointestinal tract, or GI tract).  If you have lactose intolerance, avoid dairy products. These may make your diarrhea worse.  Take medicine to help stop diarrhea (antidiarrheal medicine) only as told by your health care provider. Replacing nutrients  Eat small meals or snacks every 3-4 hours.  Eat bland foods, such as white rice, toast, or baked potato, until your diarrhea starts to get better. Gradually reintroduce nutrient-rich foods as tolerated or as told by your health care provider. This includes: ? Well-cooked protein foods. ? Peeled, seeded, and soft-cooked fruits and vegetables. ? Low-fat dairy  products.  Take vitamin and mineral supplements as told by your health care provider. Preventing dehydration   Start by sipping water or a special solution to prevent dehydration (oral rehydration solution, ORS). Urine that is clear or pale yellow means that you are getting enough fluid.  Try to drink at least 8-10 cups of fluid each day to help replace lost fluids.  You may add other liquids in addition to water, such as clear juice or decaffeinated sports drinks, as tolerated or as told by your health care provider.  Avoid drinks with caffeine, such as coffee, tea, or soft drinks.  Avoid alcohol. What foods are recommended? The items listed may not be a complete list. Talk with your health care provider about what dietary choices are best for you. Grains White rice. White, [place], or pita breads (fresh or toasted), including plain rolls, buns, or bagels. White pasta. Saltine, soda, or graham crackers. Pretzels. Low-fiber cereal. Cooked cereals made with water (such as cornmeal, farina, or cream cereals). Plain muffins. Matzo. Melba toast. [place]wie[place]. Vegetables Potatoes (without the skin). Most well-cooked and canned vegetables without skins or seeds. Tender lettuce. Fruits Apple sauce. Fruits canned in juice. Cooked apricots, cherries, grapefruit, peaches, pears, or plums. Fresh bananas and cantaloupe. Meats and other protein foods Baked or boiled chicken. Eggs. Tofu. Fish. Seafood. Smooth nut butters. Ground or well-cooked tender beef, ham, veal, lamb, pork, or poultry. Dairy Plain yogurt, kefir, and unsweetened liquid yogurt. Lactose-free milk, buttermilk, skim milk, or soy milk. Low-fat or nonfat hard cheese. Beverages Water. Low-calorie sports drinks. Fruit juices without pulp. Strained tomato and vegetable  juices. Decaffeinated teas. Sugar-free beverages not sweetened with sugar alcohols. Oral rehydration solutions, if approved by your health care provider. Seasoning and other  foods Bouillon, broth, or soups made from recommended foods. What foods are not recommended? The items listed may not be a complete list. Talk with your health care provider about what dietary choices are best for you. Grains Whole grain, whole wheat, bran, or rye breads, rolls, pastas, and crackers. Wild or brown rice. Whole grain or bran cereals. Barley. Oats and oatmeal. Corn tortillas or taco shells. Granola. Popcorn. Vegetables Raw vegetables. Fried vegetables. Cabbage, broccoli, Brussels sprouts, artichokes, baked beans, beet greens, corn, kale, legumes, peas, sweet potatoes, and yams. Potato skins. Cooked spinach and cabbage. Fruits Dried fruit, including raisins and dates. Raw fruits. Stewed or dried prunes. Canned fruits with syrup. Meat and other protein foods Fried or fatty meats. Deli meats. Chunky nut butters. Nuts and seeds. Beans and lentils. Michelle Torres. Hot dogs. Sausage. Dairy High-fat cheeses. Whole milk, chocolate milk, and beverages made with milk, such as milk shakes. Half-and-half. Cream. sour cream. Ice cream. Beverages Caffeinated beverages (such as coffee, tea, soda, or energy drinks). Alcoholic beverages. Fruit juices with pulp. Prune juice. Soft drinks sweetened with high-fructose corn syrup or sugar alcohols. High-calorie sports drinks. Fats and oils Butter. Cream sauces. Margarine. Salad oils. Plain salad dressings. Olives. Avocados. Mayonnaise. Sweets and desserts Sweet rolls, doughnuts, and sweet breads. Sugar-free desserts sweetened with sugar alcohols such as xylitol and sorbitol. Seasoning and other foods Honey. Hot sauce. Chili powder. Gravy. Cream-based or milk-based soups. Pancakes and waffles. Summary  When you have diarrhea, the foods you eat and your eating habits are very important.  Make sure you get at least 8-10 cups of fluid each day, or enough to keep your urine clear or pale yellow.  Eat bland foods and gradually reintroduce healthy, nutrient-rich  foods as tolerated, or as told by your health care provider.  Avoid high-fiber, fried, greasy, or spicy foods. This information is not intended to replace advice given to you by your health care provider. Make sure you discuss any questions you have with your health care provider. Document Released: 04/28/2003 Document Revised: 02/03/2016 Document Reviewed: 02/03/2016 Elsevier Interactive Patient Education  2018 [employer].     IF you received an x-ray today, you will receive an invoice from [facility]. Please contact [facility] at [phone] with questions or concerns regarding your invoice.   IF you received labwork today, you will receive an invoice from [place]. Please contact [facility] at [phone] with questions or concerns regarding your invoice.   Our billing staff will not be able to assist you with questions regarding bills from these companies.  You will be contacted with the lab results as soon as they are available. The fastest way to get your results is to activate your My Chart account. Instructions are located on the last page of this paperwork. If you have not heard from [place] regarding the results in 2 weeks, please contact this office.

## 2017-07-22 NOTE — Progress Notes (Signed)
Karin Pinedo  MRN: [id number] DOB: December 15, 1960  Subjective:  Michelle Torres is a 57 y.o. female seen in office today for a chief complaint of nausea x 2 days. Started yesterday after lunch time. Took zofran, no relief. Had associated loose stools and generalized abdominal discomfort. Denies focal abdominal pain, hematemesis, hematochezia, melana, fever, chills, and diaphoresis. Then develop migraine and dizziness. This is a typical migraine for her. She did not take oral migraine medication because she knew she would not keep it down.  Was just seen in office for migraine on 07/16/17 and had toradol injection, which gave her full relief. Pt has also tried phenergan suppository last night with some relief in nausea. Has not vomited since last night. Does not feel like she can go to work today. Would like phenergan and toradol shot. Denies odd food exposure.No recent travel. PSH of cholystecomy.   Review of Systems  HENT: Negative for congestion.   Eyes: Negative for photophobia, pain and visual disturbance.  Gastrointestinal: Negative for abdominal distention, blood in stool and rectal pain.  Genitourinary: Negative for decreased urine volume, difficulty urinating, dyspareunia, dysuria, enuresis, flank pain, frequency, hematuria and urgency.  Musculoskeletal: Negative for neck stiffness.  Neurological: Negative for speech difficulty, weakness and numbness.  Psychiatric/Behavioral: Negative for confusion.    Patient Active Problem List   Diagnosis Date Noted  . Nausea and vomiting 08/28/2016  . Elevated transaminase level 08/09/2016  . Mixed dyslipidemia 08/09/2016  . GERD (gastroesophageal reflux disease) 02/15/2014  . IBS (irritable bowel syndrome) 02/15/2014  . Osteopenia 02/08/2014  . Ocular myasthenia gravis ([place]) 10/07/2013  . Vitamin D deficiency 08/27/2013  . Primary hypothyroidism 08/27/2013  . Migraine 08/14/2013  . Bipolar 2 disorder ([place]) 03/05/2011  . Type 2 diabetes mellitus  with hyperglycemia, with long-term current use of insulin ([place]) 03/05/2011  . Fatty infiltration of liver 03/02/2011    Current Outpatient Medications on File Prior to Visit  Medication Sig Dispense Refill  . alosetron (LOTRONEX) 1 MG tablet Take 0.5 tablets (0.5 mg total) by mouth 2 (two) times daily. 90 tablet 1  . azaTHIOprine (IMURAN) 50 MG tablet Take 1 tablet (50 mg total) by mouth 3 (three) times daily. 270 tablet 3  . azelastine (ASTELIN) 0.1 % nasal spray Place 2 sprays into both nostrils 2 (two) times daily. Use in each nostril as directed 30 mL 0  . calcium carbonate 1250 MG capsule Take 1,000 mg by mouth daily.    . cetirizine (ZYRTEC) 10 MG tablet Take 10 mg by mouth daily.    . cholecalciferol (VITAMIN D) 1000 UNITS tablet Take 5,000 Units by mouth daily.    . clonazePAM (KLONOPIN) 1 MG tablet Take 3 mg by mouth at bedtime. May take 1/2 to 1 tablet during the day as needed     . cyclobenzaprine (FLEXERIL) 10 MG tablet Take 1 tablet (10 mg total) by mouth 3 (three) times daily as needed for muscle spasms. 30 tablet 0  . divalproex (DEPAKOTE ER) 500 MG 24 hr tablet Take 1,500 mg by mouth at bedtime.     Marland Kitchen eletriptan (RELPAX) 40 MG tablet Take 1 tablet (40 mg total) by mouth as needed for migraine. 1 tab PO at onset of headache Repeat in 2 hours if headache persists or recurs 10 tablet 12  . empagliflozin (JARDIANCE) 10 MG TABS tablet Take 10 mg by mouth daily.    . fluticasone (FLONASE) 50 MCG/ACT nasal spray Place 2 sprays into both nostrils daily. 48 g 3  .  gabapentin (NEURONTIN) 600 MG tablet Take 600 mg by mouth at bedtime.      . Insulin Lispro (HUMALOG KWIKPEN) 200 UNIT/ML SOPN Inject into the skin.    Marland Kitchen lamoTRIgine (LAMICTAL) 100 MG tablet Take 200 mg by mouth every morning.     Marland Kitchen levothyroxine (SYNTHROID, LEVOTHROID) 75 MCG tablet Take 75 mcg by mouth daily before breakfast.    . loperamide (IMODIUM) 2 MG capsule Take by mouth as needed for diarrhea or loose stools.    .  Melatonin 10 MG CAPS Take by mouth.    . naproxen (NAPROSYN) 500 MG tablet Take 500 mg by mouth 2 (two) times daily as needed (for pain).     Marland Kitchen NEEDLE, DISP, 30 G 30G X 3/4" MISC Use as directed, daily blood sugars ( please dispense what patient had previously) 100 each 3  . omeprazole (PRILOSEC) 40 MG capsule Take 1 capsule (40 mg total) by mouth daily. 30 capsule 3  . ondansetron (ZOFRAN-ODT) 8 MG disintegrating tablet Take 0.5-1 tablets (4-8 mg total) by mouth every 8 (eight) hours as needed for nausea. 90 tablet 12  . ONETOUCH VERIO test strip   12  . OXcarbazepine (TRILEPTAL) 150 MG tablet Take 150 mg by mouth at bedtime.     . pravastatin (PRAVACHOL) 40 MG tablet Take 40 mg by mouth daily.    . pregabalin (LYRICA) 75 MG capsule Take 75 mg by mouth every morning.     . promethazine (PHENERGAN) 25 MG tablet Take 1 tablet (25 mg total) by mouth every 8 (eight) hours as needed for nausea or vomiting. 20 tablet 0  . sertraline (ZOLOFT) 25 MG tablet Take 50 mg by mouth daily.    Marland Kitchen topiramate (TOPAMAX) 50 MG tablet Take 1 tablet (50 mg total) by mouth 2 (two) times daily. 180 tablet 3  . TRESIBA FLEXTOUCH 200 UNIT/ML SOPN     . TRULICITY 1.5 ZO/1.0RU SOPN INJECT 1.5 MG WEEKLY FOR DIABETES  11   No current facility-administered medications on file prior to visit.     Allergies  Allergen Reactions  . Pyridostigmine Bromide Nausea And Vomiting    "violently ill" "violently ill"  . Risperidone Other (See Comments)    liver damage "enlarged my liver"  . Metformin And Related     Severe GI distress  . Risperidone And Related Other (See Comments)    liver damage      Social History   Socioeconomic History  . Marital status: Married    Spouse name: [name] Saez  . Number of children: 2  . Years of education: 55  . Highest education level: Not on file  Occupational History  . Occupation: PROCESSING ASSISTANT    Employer: [employer]  Social Needs  . Financial resource  strain: Not on file  . Food insecurity:    Worry: Not on file    Inability: Not on file  . Transportation needs:    Medical: Not on file    Non-medical: Not on file  Tobacco Use  . Smoking status: Never Smoker  . Smokeless tobacco: Never Used  Substance and Sexual Activity  . Alcohol use: Yes    Alcohol/week: 0.6 oz    Types: 1 Standard drinks or equivalent per week    Comment: once every couple months  . Drug use: No  . Sexual activity: Yes    Partners: Male    Birth control/protection: Post-menopausal  Lifestyle  . Physical activity:    Days per week:  Not on file    Minutes per session: Not on file  . Stress: Not on file  Relationships  . Social connections:    Talks on phone: Not on file    Gets together: Not on file    Attends religious service: Not on file    Active member of club or organization: Not on file    Attends meetings of clubs or organizations: Not on file    Relationship status: Not on file  . Intimate partner violence:    Fear of current or ex partner: Not on file    Emotionally abused: Not on file    Physically abused: Not on file    Forced sexual activity: Not on file  Other Topics Concern  . Not on file  Social History Narrative   Lives with her husband.  Their daughter, lives with them intermittently.  Her son is at [facility] and lives with them. Her husband has a child from a previous relationship who lives in [place].    Objective:  BP 107/73 (BP Location: Left Arm, Patient Position: Sitting, Cuff Size: Large)   Pulse 86   Temp 97.6 F (36.4 C) (Oral)   Ht 5' 0.83" (1.545 m)   Wt 187 lb 3.2 oz (84.9 kg)   SpO2 95%   BMI 35.57 kg/m   Physical Exam  Constitutional: She is oriented to person, place, and time. She appears well-developed and well-nourished. No distress.  Sitting on exam table with eyes closed holding onto basin, no active episodes of vomiting witnessed.    HENT:  Head: Normocephalic and atraumatic.  Mouth/Throat: Uvula is midline,  oropharynx is clear and moist and mucous membranes are normal. No tonsillar exudate.  No pain with palpation of bilateral temporal regions.   Eyes: Pupils are equal, round, and reactive to light. Conjunctivae and EOM are normal.  Fundoscopic exam:      The right eye shows no hemorrhage and no papilledema. The right eye shows red reflex.       The left eye shows no hemorrhage and no papilledema. The left eye shows red reflex.  Neck: Normal range of motion and full passive range of motion without pain. No Brudzinski's sign and no Kernig's sign noted.  Cardiovascular: Normal rate, regular rhythm and normal heart sounds.  Pulmonary/Chest: Effort normal.  Abdominal: Soft. Normal appearance and bowel sounds are normal. There is generalized tenderness (mild TTP). There is no rigidity, no rebound, no guarding, no CVA tenderness and no tenderness at McBurney's point.  Neurological: She is alert and oriented to person, place, and time. She has normal strength and normal reflexes. No cranial nerve deficit or sensory deficit. She displays a negative Romberg sign. Gait normal.  Skin: Skin is warm and dry.  Psychiatric: She has a normal mood and affect.  Vitals reviewed.  Results for orders placed or performed in visit on 07/22/17 (from the past 24 hour(s))  POCT glucose (manual entry)     Status: Abnormal   Collection Time: 07/22/17 10:25 AM  Result Value Ref Range   POC Glucose 124 (A) 70 - 99 mg/dl    Pt reports much relief with ketorolac and promethaziene injections.   Assessment and Plan :  1. Migraine without aura and without status migrainosus, not intractable Patient is overall well-appearing, no acute distress.  Vital stable.  Normal neuro exam. Glucose 124.  Symptoms relieved with in office treatment.  Recommended going home, resting, increasing fluid intake, bland diet, and advancing diet as tolerated. -  ketorolac (TORADOL) injection 60 mg 2. Non-intractable vomiting with nausea, unspecified  vomiting type - POCT glucose (manual entry) - promethazine (PHENERGAN) injection 25 mg  Side effects, risks, benefits, and alternatives of the medications and treatment plan prescribed today were discussed, and patient expressed understanding of the instructions given. No barriers to understanding were identified. Red flags discussed in detail. Pt expressed understanding regarding what to do in case of emergency/urgent symptoms.   Tenna Delaine PA-C  [facility] 07/22/2017 9:53 AM

## 2017-07-22 NOTE — Assessment & Plan Note (Signed)
She has done well previously with ketorolac injection.  She has ondansetron at home, will continue to use that as needed for nausea.  Reminded that triptans work best when a dose is taken with onset of symptoms and repeated in 2 hours if any pain persists.

## 2017-07-24 ENCOUNTER — Encounter: Payer: Self-pay | Admitting: Physician Assistant

## 2017-07-31 ENCOUNTER — Ambulatory Visit: Payer: BC Managed Care – PPO | Admitting: Emergency Medicine

## 2017-07-31 ENCOUNTER — Encounter: Payer: Self-pay | Admitting: Emergency Medicine

## 2017-07-31 ENCOUNTER — Other Ambulatory Visit: Payer: Self-pay

## 2017-07-31 VITALS — BP 110/64 | HR 78 | Temp 98.4°F | Resp 16 | Ht 59.5 in | Wt 189.8 lb

## 2017-07-31 DIAGNOSIS — H6690 Otitis media, unspecified, unspecified ear: Secondary | ICD-10-CM | POA: Diagnosis not present

## 2017-07-31 DIAGNOSIS — J029 Acute pharyngitis, unspecified: Secondary | ICD-10-CM | POA: Insufficient documentation

## 2017-07-31 DIAGNOSIS — H9201 Otalgia, right ear: Secondary | ICD-10-CM | POA: Diagnosis not present

## 2017-07-31 MED ORDER — HYDROCORTISONE-ACETIC ACID 1-2 % OT SOLN: 3.0000 [drp] | Freq: Three times a day (TID) | OTIC | 1 refills | Status: DC

## 2017-07-31 MED ORDER — AZITHROMYCIN 250 MG PO TABS: ORAL_TABLET | ORAL | 0 refills | Status: DC

## 2017-07-31 NOTE — Patient Instructions (Addendum)
IF you received an x-ray today, you will receive an invoice from [facility]. Please contact [facility] at [phone] with questions or concerns regarding your invoice.   IF you received labwork today, you will receive an invoice from [facility]. Please contact [facility] at [phone] with questions or concerns regarding your invoice.   Our billing staff will not be able to assist you with questions regarding bills from these companies.  You will be contacted with the lab results as soon as they are available. The fastest way to get your results is to activate your My Chart account. Instructions are located on the last page of this paperwork. If you have not heard from [place] regarding the results in 2 weeks, please contact this office.     Sore Throat When you have a sore throat, your throat may:  Hurt.  Burn.  Feel irritated.  Feel scratchy.  Many things can cause a sore throat, including:  An infection.  Allergies.  Dryness in the air.  Smoke or pollution.  Gastroesophageal reflux disease (GERD).  A tumor.  A sore throat can be the first sign of another sickness. It can happen with other problems, like coughing or a fever. Most sore throats go away without treatment. Follow these instructions at home:  Take over-the-counter medicines only as told by your doctor.  Drink enough fluids to keep your pee (urine) clear or pale yellow.  Rest when you feel you need to.  To help with pain, try: ? Sipping warm liquids, such as broth, herbal tea, or warm water. ? Eating or drinking cold or frozen liquids, such as frozen ice pops. ? Gargling with a salt-water mixture 3-4 times a day or as needed. To make a salt-water mixture, add -1 tsp of salt in 1 cup of warm water. Mix it until you cannot see the salt anymore. ? Sucking on hard candy or throat lozenges. ? Putting a cool-mist humidifier in your bedroom at night. ? Sitting in the bathroom with the  door closed for 5-10 minutes while you run hot water in the shower.  Do not use any tobacco products, such as cigarettes, chewing tobacco, and e-cigarettes. If you need help quitting, ask your doctor. Contact a doctor if:  You have a fever for more than 2-3 days.  You keep having symptoms for more than 2-3 days.  Your throat does not get better in 7 days.  You have a fever and your symptoms suddenly get worse. Get help right away if:  You have trouble breathing.  You cannot swallow fluids, soft foods, or your saliva.  You have swelling in your throat or neck that gets worse.  You keep feeling like you are going to throw up (vomit).  You keep throwing up. This information is not intended to replace advice given to you by your health care provider. Make sure you discuss any questions you have with your health care provider. Document Released: 11/15/2007 Document Revised: 10/02/2015 Document Reviewed: 11/26/2014 Elsevier Interactive Patient Education  2018 [place], Adult An earache, or ear pain, can be caused by many things, including:  An infection.  Ear wax buildup.  Ear pressure.  Something in the ear that should not be there (foreign body).  A sore throat.  Tooth problems.  Jaw problems.  Treatment of the earache will depend on the cause. If the cause is not clear or cannot be determined, you may need to watch your symptoms until your earache  goes away or until a cause is found. Follow these instructions at home: Pay attention to any changes in your symptoms. Take these actions to help with your pain:  Take or apply over-the-counter and prescription medicines only as told by your health care provider.  If you were prescribed an antibiotic medicine, use it as told by your health care provider. Do not stop using the antibiotic even if you start to feel better.  Do not put anything in your ear other than medicine that is prescribed by your health care  provider.  If directed, apply heat to the affected area as often as told by your health care provider. Use the heat source that your health care provider recommends, such as a moist heat pack or a heating pad. ? Place a towel between your skin and the heat source. ? Leave the heat on for 20-30 minutes. ? Remove the heat if your skin turns bright red. This is especially important if you are unable to feel pain, heat, or cold. You may have a greater risk of getting burned.  If directed, put ice on the ear: ? Put ice in a plastic bag. ? Place a towel between your skin and the bag. ? Leave the ice on for 20 minutes, 2-3 times a day.  Try resting in an upright position instead of lying down. This may help to reduce pressure in your ear and relieve pain.  Chew gum if it helps to relieve your ear pain.  Treat any allergies as told by your health care provider.  Keep all follow-up visits as told by your health care provider. This is important.  Contact a health care provider if:  Your pain does not improve within 2 days.  Your earache gets worse.  You have new symptoms.  You have a fever. Get help right away if:  You have a severe headache.  You have a stiff neck.  You have trouble swallowing.  You have redness or swelling behind your ear.  You have fluid or blood coming from your ear.  You have hearing loss.  You feel dizzy. This information is not intended to replace advice given to you by your health care provider. Make sure you discuss any questions you have with your health care provider. Document Released: 09/23/2003 Document Revised: 10/04/2015 Document Reviewed: 08/01/2015 Elsevier Interactive Patient Education  [employer].

## 2017-07-31 NOTE — Progress Notes (Signed)
Michelle Torres 57 y.o.   Chief Complaint  Patient presents with  . Otalgia    RIGHT with sorethroat x 07/30/2017  . Headache    HISTORY OF PRESENT ILLNESS: This is a 57 y.o. female complaining of pain to right ear for 3 to 4 days along with sore throat.  Also complaining of left-sided headache, waxing and waning, since it all started.  HPI   Prior to Admission medications   Medication Sig Start Date End Date Taking? Authorizing Provider  alosetron (LOTRONEX) 1 MG tablet Take 0.5 tablets (0.5 mg total) by mouth 2 (two) times daily. 03/19/17  Yes Jeffery, Chelle, PA-C  azaTHIOprine (IMURAN) 50 MG tablet Take 1 tablet (50 mg total) by mouth 3 (three) times daily. 03/19/17  Yes Jeffery, Chelle, PA-C  azelastine (ASTELIN) 0.1 % nasal spray Place 2 sprays into both nostrils 2 (two) times daily. Use in each nostril as directed 07/24/16  Yes Jeffery, Chelle, PA-C  calcium carbonate 1250 MG capsule Take 1,000 mg by mouth daily.   Yes [provider]  cetirizine (ZYRTEC) 10 MG tablet Take 10 mg by mouth daily.   Yes [provider]  cholecalciferol (VITAMIN D) 1000 UNITS tablet Take 5,000 Units by mouth daily.   Yes [provider]  clonazePAM (KLONOPIN) 1 MG tablet Take 3 mg by mouth at bedtime. May take 1/2 to 1 tablet during the day as needed    Yes [provider]  cyclobenzaprine (FLEXERIL) 10 MG tablet Take 1 tablet (10 mg total) by mouth 3 (three) times daily as needed for muscle spasms. 07/16/17  Yes Jeffery, Chelle, PA-C  divalproex (DEPAKOTE ER) 500 MG 24 hr tablet Take 1,500 mg by mouth at bedtime.    Yes [provider]  eletriptan (RELPAX) 40 MG tablet Take 1 tablet (40 mg total) by mouth as needed for migraine. 1 tab PO at onset of headache Repeat in 2 hours if headache persists or recurs 07/04/17  Yes Jeffery, Chelle, PA-C  empagliflozin (JARDIANCE) 10 MG TABS tablet Take 10 mg by mouth daily.   Yes [provider]  fluticasone  (FLONASE) 50 MCG/ACT nasal spray Place 2 sprays into both nostrils daily. 07/04/17  Yes Jeffery, Chelle, PA-C  gabapentin (NEURONTIN) 600 MG tablet Take 600 mg by mouth at bedtime.     Yes [provider]  Insulin Lispro (HUMALOG KWIKPEN) 200 UNIT/ML SOPN Inject into the skin.   Yes [provider]  lamoTRIgine (LAMICTAL) 100 MG tablet Take 200 mg by mouth every morning.    Yes [provider]  levothyroxine (SYNTHROID, LEVOTHROID) 75 MCG tablet Take 75 mcg by mouth daily before breakfast.   Yes [provider]  loperamide (IMODIUM) 2 MG capsule Take by mouth as needed for diarrhea or loose stools.   Yes [provider]  Melatonin 10 MG CAPS Take by mouth.   Yes [provider]  naproxen (NAPROSYN) 500 MG tablet Take 500 mg by mouth 2 (two) times daily as needed (for pain).    Yes [provider]  omeprazole (PRILOSEC) 40 MG capsule Take 1 capsule (40 mg total) by mouth daily. 02/08/14  Yes Jeffery, Chelle, PA-C  ondansetron (ZOFRAN-ODT) 8 MG disintegrating tablet Take 0.5-1 tablets (4-8 mg total) by mouth every 8 (eight) hours as needed for nausea. 07/04/17  Yes Jeffery, Chelle, PA-C  OXcarbazepine (TRILEPTAL) 150 MG tablet Take 150 mg by mouth at bedtime.    Yes [provider]  pravastatin (PRAVACHOL) 40 MG tablet Take  40 mg by mouth daily.   Yes [provider]  pregabalin (LYRICA) 75 MG capsule Take 75 mg by mouth every morning.    Yes [provider]  promethazine (PHENERGAN) 25 MG tablet Take 1 tablet (25 mg total) by mouth every 8 (eight) hours as needed for nausea or vomiting. 07/04/17  Yes Jeffery, Chelle, PA-C  sertraline (ZOLOFT) 25 MG tablet Take 50 mg by mouth daily.   Yes [provider]  topiramate (TOPAMAX) 50 MG tablet Take 1 tablet (50 mg total) by mouth 2 (two) times daily. 03/27/16  Yes Harrison Mons, PA-C  TRESIBA FLEXTOUCH 200 UNIT/ML SOPN  07/02/17  Yes [provider]    TRULICITY 1.5 KG/2.5KY SOPN INJECT 1.5 MG WEEKLY FOR DIABETES 09/22/14  Yes [provider]  NEEDLE, DISP, 30 G 30G X 3/4" MISC Use as directed, daily blood sugars ( please dispense what patient had previously) 07/14/13   Marin Comment, Thao P, DO  ONETOUCH VERIO test strip  01/25/14   [provider]    Allergies  Allergen Reactions  . Pyridostigmine Bromide Nausea And Vomiting    "violently ill" "violently ill"  . Risperidone Other (See Comments)    liver damage "enlarged my liver"  . Metformin And Related     Severe GI distress  . Risperidone And Related Other (See Comments)    liver damage    Patient Active Problem List   Diagnosis Date Noted  . Nausea and vomiting 08/28/2016  . Elevated transaminase level 08/09/2016  . Mixed dyslipidemia 08/09/2016  . GERD (gastroesophageal reflux disease) 02/15/2014  . IBS (irritable bowel syndrome) 02/15/2014  . Osteopenia 02/08/2014  . 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 Vitamin D deficiency 08/27/2013  . Primary hypothyroidism 08/27/2013  . Migraine 08/14/2013  . Bipolar 2 disorder ([place]) 03/05/2011  . Type 2 diabetes mellitus with hyperglycemia, with long-term current use of insulin ([place]) 03/05/2011  . Fatty infiltration of liver 03/02/2011    Past Medical History:  Diagnosis Date  . Anxiety   . Bipolar disorder ([place])   . Closed fracture of proximal end of left humerus with routine healing, subsequent encounter 12/15/2015  . Depression   . Diabetes mellitus   . Fatty liver disease, nonalcoholic   . GERD (gastroesophageal reflux disease)   . High cholesterol   . Hypothyroidism     Past Surgical History:  Procedure Laterality Date  . DILATION AND CURETTAGE OF UTERUS    . ERCP  03/03/2011   Procedure: ENDOSCOPIC RETROGRADE CHOLANGIOPANCREATOGRAPHY (ERCP);  Surgeon: Lafayette Dragon, MD;  Location: [facility] ENDOSCOPY;  Service: Endoscopy;  Laterality: N/A;  . [place]  . TOE SURGERY Right  04/05/2017   right big toe    Social History   Socioeconomic History  . Marital status: Married    Spouse name: [name] Streat  . Number of children: 2  . Years of education: 18  . Highest education level: Not on file  Occupational History  . Occupation: PROCESSING ASSISTANT    Employer: [employer]  Social Needs  . Financial resource strain: Not on file  . Food insecurity:    Worry: Not on file    Inability: Not on file  . Transportation needs:    Medical: Not on file    Non-medical: Not on file  Tobacco Use  . Smoking status: Never Smoker  . Smokeless tobacco: Never Used  Substance and Sexual Activity  . Alcohol use: Yes    Alcohol/week: 0.6  oz    Types: 1 Standard drinks or equivalent per week    Comment: once every couple months  . Drug use: No  . Sexual activity: Yes    Partners: Male    Birth control/protection: Post-menopausal  Lifestyle  . Physical activity:    Days per week: Not on file    Minutes per session: Not on file  . Stress: Not on file  Relationships  . Social connections:    Talks on phone: Not on file    Gets together: Not on file    Attends religious service: Not on file    Active member of club or organization: Not on file    Attends meetings of clubs or organizations: Not on file    Relationship status: Not on file  . Intimate partner violence:    Fear of current or ex partner: Not on file    Emotionally abused: Not on file    Physically abused: Not on file    Forced sexual activity: Not on file  Other Topics Concern  . Not on file  Social History Narrative   Lives with her husband.  Their daughter, lives with them intermittently.  Her son is at [facility] and lives with them. Her husband has a child from a previous relationship who lives in [place].    Family History  Problem Relation Age of Onset  . Mental illness Father        Manic Depression  . Alcohol abuse Father   . Stroke Father   . Heart disease Father   . Arthritis  Son   . Depression Son   . Cancer Mother 18       peritoneal  . Breast cancer Maternal Grandmother   . Breast cancer Paternal Grandmother      Review of Systems  Constitutional: Positive for malaise/fatigue. Negative for chills and fever.  HENT: Positive for ear pain and sore throat.   Eyes: Negative.  Negative for discharge and redness.  Respiratory: Negative for cough and shortness of breath.   Cardiovascular: Negative for chest pain and leg swelling.  Gastrointestinal: Negative for abdominal pain, diarrhea, nausea and vomiting.  Genitourinary: Negative.   Skin: Negative for rash.  Neurological: Positive for headaches. Negative for dizziness.  Endo/Heme/Allergies: Negative.   All other systems reviewed and are negative.  Vitals:   07/31/17 1202  BP: 110/64  Pulse: 78  Resp: 16  Temp: 98.4 F (36.9 C)  SpO2: 97%     Physical Exam  Constitutional: She is oriented to person, place, and time. She appears well-developed and well-nourished.  HENT:  Head: Normocephalic and atraumatic.  Mouth/Throat: Uvula is midline. Posterior oropharyngeal erythema present. No oropharyngeal exudate, posterior oropharyngeal edema or tonsillar abscesses.  Right ear: Hyperemic and tender canal.  Dull tympanic membrane with questionable effusion  Eyes: Pupils are equal, round, and reactive to light. Conjunctivae and EOM are normal.  Neck: Normal range of motion. Neck supple.  Cardiovascular: Normal rate, regular rhythm and normal heart sounds.  Pulmonary/Chest: Effort normal and breath sounds normal.  Musculoskeletal: Normal range of motion.  Neurological: She is alert and oriented to person, place, and time. No sensory deficit. She exhibits normal muscle tone.  Skin: Skin is warm and dry. Capillary refill takes less than 2 seconds.  Psychiatric: She has a normal mood and affect. Her behavior is normal.     ASSESSMENT & PLAN: Ceci was seen today for otalgia and headache.  Diagnoses and  all orders for this  visit:  Otalgia, right ear -     acetic acid-hydrocortisone (VOSOL-HC) OTIC solution; Place 3 drops into the left ear 3 (three) times daily.  Sore throat  Acute pharyngitis, unspecified etiology -     azithromycin (ZITHROMAX) 250 MG tablet; Sig as indicated  Ear infection -     azithromycin (ZITHROMAX) 250 MG tablet; Sig as indicated -     acetic acid-hydrocortisone (VOSOL-HC) OTIC solution; Place 3 drops into the left ear 3 (three) times daily.    Patient Instructions       IF you received an x-ray today, you will receive an invoice from [facility]. Please contact [facility] at [phone] with questions or concerns regarding your invoice.   IF you received labwork today, you will receive an invoice from [facility]. Please contact [facility] at [phone] with questions or concerns regarding your invoice.   Our billing staff will not be able to assist you with questions regarding bills from these companies.  You will be contacted with the lab results as soon as they are available. The fastest way to get your results is to activate your My Chart account. Instructions are located on the last page of this paperwork. If you have not heard from [place] regarding the results in 2 weeks, please contact this office.     Sore Throat When you have a sore throat, your throat may:  Hurt.  Burn.  Feel irritated.  Feel scratchy.  Many things can cause a sore throat, including:  An infection.  Allergies.  Dryness in the air.  Smoke or pollution.  Gastroesophageal reflux disease (GERD).  A tumor.  A sore throat can be the first sign of another sickness. It can happen with other problems, like coughing or a fever. Most sore throats go away without treatment. Follow these instructions at home:  Take over-the-counter medicines only as told by your doctor.  Drink enough fluids to keep your pee (urine) clear or pale yellow.  Rest when you  feel you need to.  To help with pain, try: ? Sipping warm liquids, such as broth, herbal tea, or warm water. ? Eating or drinking cold or frozen liquids, such as frozen ice pops. ? Gargling with a salt-water mixture 3-4 times a day or as needed. To make a salt-water mixture, add -1 tsp of salt in 1 cup of warm water. Mix it until you cannot see the salt anymore. ? Sucking on hard candy or throat lozenges. ? Putting a cool-mist humidifier in your bedroom at night. ? Sitting in the bathroom with the door closed for 5-10 minutes while you run hot water in the shower.  Do not use any tobacco products, such as cigarettes, chewing tobacco, and e-cigarettes. If you need help quitting, ask your doctor. Contact a doctor if:  You have a fever for more than 2-3 days.  You keep having symptoms for more than 2-3 days.  Your throat does not get better in 7 days.  You have a fever and your symptoms suddenly get worse. Get help right away if:  You have trouble breathing.  You cannot swallow fluids, soft foods, or your saliva.  You have swelling in your throat or neck that gets worse.  You keep feeling like you are going to throw up (vomit).  You keep throwing up. This information is not intended to replace advice given to you by your health care provider. Make sure you discuss any questions you have with your health care provider. Document Released:  11/15/2007 Document Revised: 10/02/2015 Document Reviewed: 11/26/2014 Elsevier Interactive Patient Education  2018 [place], Adult An earache, or ear pain, can be caused by many things, including:  An infection.  Ear wax buildup.  Ear pressure.  Something in the ear that should not be there (foreign body).  A sore throat.  Tooth problems.  Jaw problems.  Treatment of the earache will depend on the cause. If the cause is not clear or cannot be determined, you may need to watch your symptoms until your earache goes away  or until a cause is found. Follow these instructions at home: Pay attention to any changes in your symptoms. Take these actions to help with your pain:  Take or apply over-the-counter and prescription medicines only as told by your health care provider.  If you were prescribed an antibiotic medicine, use it as told by your health care provider. Do not stop using the antibiotic even if you start to feel better.  Do not put anything in your ear other than medicine that is prescribed by your health care provider.  If directed, apply heat to the affected area as often as told by your health care provider. Use the heat source that your health care provider recommends, such as a moist heat pack or a heating pad. ? Place a towel between your skin and the heat source. ? Leave the heat on for 20-30 minutes. ? Remove the heat if your skin turns bright red. This is especially important if you are unable to feel pain, heat, or cold. You may have a greater risk of getting burned.  If directed, put ice on the ear: ? Put ice in a plastic bag. ? Place a towel between your skin and the bag. ? Leave the ice on for 20 minutes, 2-3 times a day.  Try resting in an upright position instead of lying down. This may help to reduce pressure in your ear and relieve pain.  Chew gum if it helps to relieve your ear pain.  Treat any allergies as told by your health care provider.  Keep all follow-up visits as told by your health care provider. This is important.  Contact a health care provider if:  Your pain does not improve within 2 days.  Your earache gets worse.  You have new symptoms.  You have a fever. Get help right away if:  You have a severe headache.  You have a stiff neck.  You have trouble swallowing.  You have redness or swelling behind your ear.  You have fluid or blood coming from your ear.  You have hearing loss.  You feel dizzy. This information is not intended to replace advice  given to you by your health care provider. Make sure you discuss any questions you have with your health care provider. Document Released: 09/23/2003 Document Revised: 10/04/2015 Document Reviewed: 08/01/2015 Elsevier Interactive Patient Education  2018 [employer].      Agustina Caroli, MD [facility]

## 2017-08-01 ENCOUNTER — Telehealth: Payer: Self-pay

## 2017-08-01 NOTE — Telephone Encounter (Signed)
Copied from [place] [id number]. Topic: Inquiry >> Jul 31, 2017  2:58 PM Lennox Solders wrote: Reason for CRM: pt is calling and request to speak with [facility] concerning an visit with chelle jeffrey on 07-04-17. Pt was told today she did not have insurance for 07-04-17. Pt has bcbs  Sent message to [employer].

## 2017-08-05 ENCOUNTER — Encounter: Payer: Self-pay | Admitting: Family Medicine

## 2017-08-05 ENCOUNTER — Ambulatory Visit: Payer: Self-pay

## 2017-08-05 ENCOUNTER — Ambulatory Visit: Payer: BC Managed Care – PPO | Admitting: Family Medicine

## 2017-08-05 VITALS — BP 108/70 | HR 84 | Temp 97.8°F | Resp 16 | Ht 59.75 in | Wt 189.4 lb

## 2017-08-05 DIAGNOSIS — J029 Acute pharyngitis, unspecified: Secondary | ICD-10-CM

## 2017-08-05 DIAGNOSIS — E1165 Type 2 diabetes mellitus with hyperglycemia: Secondary | ICD-10-CM

## 2017-08-05 DIAGNOSIS — Z794 Long term (current) use of insulin: Secondary | ICD-10-CM | POA: Diagnosis not present

## 2017-08-05 DIAGNOSIS — R42 Dizziness and giddiness: Secondary | ICD-10-CM | POA: Diagnosis not present

## 2017-08-05 DIAGNOSIS — R1114 Bilious vomiting: Secondary | ICD-10-CM | POA: Diagnosis not present

## 2017-08-05 LAB — BASIC METABOLIC PANEL
BUN/Creatinine Ratio: 21 (ref 9–23)
BUN: 16 mg/dL (ref 6–24)
CALCIUM: 9.8 mg/dL (ref 8.7–10.2)
CO2: 24 mmol/L (ref 20–29)
CREATININE: 0.78 mg/dL (ref 0.57–1.00)
Chloride: 103 mmol/L (ref 96–106)
GFR calc Af Amer: 99 mL/min/{1.73_m2} (ref >59)
GFR, EST NON AFRICAN AMERICAN: 86 mL/min/{1.73_m2} (ref >59)
Glucose: 175 mg/dL — ABNORMAL HIGH (ref 65–99)
POTASSIUM: 4.8 mmol/L (ref 3.5–5.2)
Sodium: 141 mmol/L (ref 134–144)

## 2017-08-05 LAB — POCT URINALYSIS DIP (MANUAL ENTRY)
BILIRUBIN UA: NEGATIVE
Glucose, UA: 500 mg/dL — AB
Leukocytes, UA: NEGATIVE
Nitrite, UA: NEGATIVE
PH UA: 6 (ref 5.0–8.0)
PROTEIN UA: NEGATIVE mg/dL
RBC UA: NEGATIVE
SPEC GRAV UA: 1.015 (ref 1.010–1.025)
Urobilinogen, UA: 1 E.U./dL

## 2017-08-05 LAB — POC MICROSCOPIC URINALYSIS (UMFC): Mucus: ABSENT

## 2017-08-05 LAB — POCT CBC
GRANULOCYTE PERCENT: 51 % (ref 37–80)
HEMATOCRIT: 41.5 % (ref 37.7–47.9)
HEMOGLOBIN: 13.7 g/dL (ref 12.2–16.2)
Lymph, poc: 3.6 — AB (ref 0.6–3.4)
MCH: 29.1 pg (ref 27–31.2)
MCHC: 33.1 g/dL (ref 31.8–35.4)
MCV: 88.1 fL (ref 80–97)
MID (cbc): 0.5 (ref 0–0.9)
MPV: 7.6 fL (ref 0–99.8)
POC GRANULOCYTE: 4.3 (ref 2–6.9)
POC LYMPH PERCENT: 42.8 %L (ref 10–50)
POC MID %: 6.2 %M (ref 0–12)
Platelet Count, POC: 346 10*3/uL (ref 142–424)
RBC: 4.71 M/uL (ref 4.04–5.48)
RDW, POC: 16.1 %
WBC: 8.5 10*3/uL (ref 4.6–10.2)

## 2017-08-05 LAB — GLUCOSE, POCT (MANUAL RESULT ENTRY): POC GLUCOSE: 176 mg/dL — AB (ref 70–99)

## 2017-08-05 LAB — POCT SEDIMENTATION RATE: POCT SED RATE: 30 mm/hr — AB (ref 0–22)

## 2017-08-05 MED ORDER — MECLIZINE HCL 25 MG PO TABS: 25.0000 mg | ORAL_TABLET | Freq: Three times a day (TID) | ORAL | 0 refills | Status: AC | PRN

## 2017-08-05 MED ORDER — ONDANSETRON HCL 4 MG/2ML IJ SOLN
4.0000 mg | Freq: Once | INTRAMUSCULAR | Status: AC
  Administered 2017-08-05: 4 mg via INTRAMUSCULAR

## 2017-08-05 MED ORDER — PROMETHAZINE HCL 25 MG/ML IJ SOLN
25.0000 mg | Freq: Once | INTRAMUSCULAR | Status: AC
  Administered 2017-08-05: 25 mg via INTRAMUSCULAR

## 2017-08-05 NOTE — Patient Instructions (Addendum)
IF you received an x-ray today, you will receive an invoice from [facility]. Please contact [facility] at [phone] with questions or concerns regarding your invoice.   IF you received labwork today, you will receive an invoice from [facility]. Please contact [facility] at [phone] with questions or concerns regarding your invoice.   Our billing staff will not be able to assist you with questions regarding bills from these companies.  You will be contacted with the lab results as soon as they are available. The fastest way to get your results is to activate your My Chart account. Instructions are located on the last page of this paperwork. If you have not heard from [place] regarding the results in 2 weeks, please contact this office.    How to Perform the Epley Maneuver The Epley maneuver is an exercise that relieves symptoms of vertigo. Vertigo is the feeling that you or your surroundings are moving when they are not. When you feel vertigo, you may feel like the room is spinning and have trouble walking. Dizziness is a little different than vertigo. When you are dizzy, you may feel unsteady or light-headed. You can do this maneuver at home whenever you have symptoms of vertigo. You can do it up to 3 times a day until your symptoms go away. Even though the Epley maneuver may relieve your vertigo for a few weeks, it is possible that your symptoms will return. This maneuver relieves vertigo, but it does not relieve dizziness. What are the risks? If it is done correctly, the Epley maneuver is considered safe. Sometimes it can lead to dizziness or nausea that goes away after a short time. If you develop other symptoms, such as changes in vision, weakness, or numbness, stop doing the maneuver and call your health care provider. How to perform the Epley maneuver 1. Sit on the edge of a bed or table with your back straight and your legs extended or hanging over the edge of the bed  or table. 2. Turn your head halfway toward the affected ear or side. 3. Lie backward quickly with your head turned until you are lying flat on your back. You may want to position a pillow under your shoulders. 4. Hold this position for 30 seconds. You may experience an attack of vertigo. This is normal. 5. Turn your head to the opposite direction until your unaffected ear is facing the floor. 6. Hold this position for 30 seconds. You may experience an attack of vertigo. This is normal. Hold this position until the vertigo stops. 7. Turn your whole body to the same side as your head. Hold for another 30 seconds. 8. Sit back up. You can repeat this exercise up to 3 times a day. Follow these instructions at home:  After doing the Epley maneuver, you can return to your normal activities.  Ask your health care provider if there is anything you should do at home to prevent vertigo. He or she may recommend that you: ? Keep your head raised (elevated) with two or more pillows while you sleep. ? Do not sleep on the side of your affected ear. ? Get up slowly from bed. ? Avoid sudden movements during the day. ? Avoid extreme head movement, like looking up or bending over. Contact a health care provider if:  Your vertigo gets worse.  You have other symptoms, including: ? Nausea. ? Vomiting. ? Headache. Get help right away if:  You have vision changes.  You have  a severe or worsening headache or neck pain.  You cannot stop vomiting.  You have new numbness or weakness in any part of your body. Summary  Vertigo is the feeling that you or your surroundings are moving when they are not.  The Epley maneuver is an exercise that relieves symptoms of vertigo.  If the Epley maneuver is done correctly, it is considered safe. You can do it up to 3 times a day. This information is not intended to replace advice given to you by your health care provider. Make sure you discuss any questions you have  with your health care provider. Document Released: 02/10/2013 Document Revised: 12/27/2015 Document Reviewed: 12/27/2015 Elsevier Interactive Patient Education  2017 Elsevier Inc.  Benign Positional Vertigo Vertigo is the feeling that you or your surroundings are moving when they are not. Benign positional vertigo is the most common form of vertigo. The cause of this condition is not serious (is benign). This condition is triggered by certain movements and positions (is positional). This condition can be dangerous if it occurs while you are doing something that could endanger you or others, such as driving. What are the causes? In many cases, the cause of this condition is not known. It may be caused by a disturbance in an area of the inner ear that helps your brain to sense movement and balance. This disturbance can be caused by a viral infection (labyrinthitis), head injury, or repetitive motion. What increases the risk? This condition is more likely to develop in:  Women.  People who are 65 years of age or older.  What are the signs or symptoms? Symptoms of this condition usually happen when you move your head or your eyes in different directions. Symptoms may start suddenly, and they usually last for less than a minute. Symptoms may include:  Loss of balance and falling.  Feeling like you are spinning or moving.  Feeling like your surroundings are spinning or moving.  Nausea and vomiting.  Blurred vision.  Dizziness.  Involuntary eye movement (nystagmus).  Symptoms can be mild and cause only slight annoyance, or they can be severe and interfere with daily life. Episodes of benign positional vertigo may return (recur) over time, and they may be triggered by certain movements. Symptoms may improve over time. How is this diagnosed? This condition is usually diagnosed by medical history and a physical exam of the head, neck, and ears. You may be referred to a health care provider  who specializes in ear, nose, and throat (ENT) problems (otolaryngologist) or a provider who specializes in disorders of the nervous system (neurologist). You may have additional testing, including:  MRI.  A CT scan.  Eye movement tests. Your health care provider may ask you to change positions quickly while he or she watches you for symptoms of benign positional vertigo, such as nystagmus. Eye movement may be tested with an electronystagmogram (ENG), caloric stimulation, the Dix-Hallpike test, or the roll test.  An electroencephalogram (EEG). This records electrical activity in your brain.  Hearing tests.  How is this treated? Usually, your health care provider will treat this by moving your head in specific positions to adjust your inner ear back to normal. Surgery may be needed in severe cases, but this is rare. In some cases, benign positional vertigo may resolve on its own in 2-4 weeks. Follow these instructions at home: Safety  Move slowly.Avoid sudden body or head movements.  Avoid driving.  Avoid operating heavy machinery.  Avoid doing any  tasks that would be dangerous to you or others if a vertigo episode would occur.  If you have trouble walking or keeping your balance, try using a cane for stability. If you feel dizzy or unstable, sit down right away.  Return to your normal activities as told by your health care provider. Ask your health care provider what activities are safe for you. General instructions  Take over-the-counter and prescription medicines only as told by your health care provider.  Avoid certain positions or movements as told by your health care provider.  Drink enough fluid to keep your urine clear or pale yellow.  Keep all follow-up visits as told by your health care provider. This is important. Contact a health care provider if:  You have a fever.  Your condition gets worse or you develop new symptoms.  Your family or friends notice any  behavioral changes.  Your nausea or vomiting gets worse.  You have numbness or a "pins and needles" sensation. Get help right away if:  You have difficulty speaking or moving.  You are always dizzy.  You faint.  You develop severe headaches.  You have weakness in your legs or arms.  You have changes in your hearing or vision.  You develop a stiff neck.  You develop sensitivity to light. This information is not intended to replace advice given to you by your health care provider. Make sure you discuss any questions you have with your health care provider. Document Released: 11/13/2005 Document Revised: 07/14/2015 Document Reviewed: 05/31/2014 Elsevier Interactive Patient Education  [employer].

## 2017-08-05 NOTE — Telephone Encounter (Signed)
Pt. States yesterday morning she noticed some dizziness that is "getting a little worse." States she just finished an antibiotic for right ear infection. Also has a sore throat and sinus drainage. Denies fever. Appointment made for this morning. Reason for Disposition . [1] MODERATE dizziness (e.g., interferes with normal activities) AND [2] has NOT been evaluated by physician for this  (Exception: dizziness caused by heat exposure, sudden standing, or poor fluid intake)  Answer Assessment - Initial Assessment Questions 1. DESCRIPTION: "Describe your dizziness."     Lightheaded 2. LIGHTHEADED: "Do you feel lightheaded?" (e.g., somewhat faint, woozy, weak upon standing)     Woozy 3. VERTIGO: "Do you feel like either you or the room is spinning or tilting?" (i.e. vertigo)     No 4. SEVERITY: "How bad is it?"  "Do you feel like you are going to faint?" "Can you stand and walk?"   - MILD - walking normally   - MODERATE - interferes with normal activities (e.g., work, school)    - SEVERE - unable to stand, requires support to walk, feels like passing out now.      Moderate 5. ONSET:  "When did the dizziness begin?"     Started yesterday morning and getting worse 6. AGGRAVATING FACTORS: "Does anything make it worse?" (e.g., standing, change in head position)     Changing position 7. HEART RATE: "Can you tell me your heart rate?" "How many beats in 15 seconds?"  (Note: not all patients can do this)       No  8. CAUSE: "What do you think is causing the dizziness?"     Unsure 9. RECURRENT SYMPTOM: "Have you had dizziness before?" If so, ask: "When was the last time?" "What happened that time?"     Some 10. OTHER SYMPTOMS: "Do you have any other symptoms?" (e.g., fever, chest pain, vomiting, diarrhea, bleeding)       No fever, sore throat, sinus drainage 11. PREGNANCY: "Is there any chance you are pregnant?" "When was your last menstrual period?"       No  Protocols used: DIZZINESS [facility]

## 2017-08-05 NOTE — Progress Notes (Signed)
Subjective:  By signing my name below, I, Essence Howell, attest that this documentation has been prepared under the direction and in the presence of Delman Cheadle, MD Electronically Signed: Ladene Artist, ED Scribe 08/05/2017 at 9:28 AM.   Patient ID: Michelle Torres, female    DOB: 1960/07/05, 57 y.o.   MRN: [id number]  Chief Complaint  Patient presents with  . Dizziness    started yesterday 08/04/17, "starting to have it even when sitting down  . Sinus pressure    more than last vs on 07/31/17  . fatigue  . stiff neck    started on last Friday  . left foot pain    x 5 days, "I haven't ripped or did anything"   HPI Michelle Torres is a 57 y.o. female who presents to Primary Care at [facility] complaining of multiple symptoms. Pt was seen in the office 5 days ago for R ear pain and sore throat, started on Zithromax which she finished yesterday. Today, she states that R ear pain has improved but she still has some sore throat, sinus pressure and fatigue. Also reports that she has developed dizziness at rest onset yesterday, worse since 5 AM today, as well as neck stiffness. She has tried Flonase, Astelin nasal spray, Zyrtec, Zofran, Tylenol and applying an ice pack to her forehead without significant relief. Reports that her CBGs have been elevated recently as well. States her morning blood sugars typically average around 70 in the morning but have been 120 lately. Denies hypoglycemic lows. Denies chills, fever, decreased hearing, tinnitus.  L Foot Pain Pt reports L foot pain x 4-5 days ago. She reports increased pain with flexing and rotating her foot. Pt has tried ice and heat without significant improvement. Denies injury/fall, swelling, redness.  Past Medical History:  Diagnosis Date  . Anxiety   . Bipolar disorder ([place])   . Closed fracture of proximal end of left humerus with routine healing, subsequent encounter 12/15/2015  . Depression   . Diabetes mellitus   . Fatty liver disease,  nonalcoholic   . GERD (gastroesophageal reflux disease)   . High cholesterol   . Hypothyroidism    Current Outpatient Medications on File Prior to Visit  Medication Sig Dispense Refill  . acetic acid-hydrocortisone (VOSOL-HC) OTIC solution Place 3 drops into the left ear 3 (three) times daily. 10 mL 1  . alosetron (LOTRONEX) 1 MG tablet Take 0.5 tablets (0.5 mg total) by mouth 2 (two) times daily. 90 tablet 1  . azaTHIOprine (IMURAN) 50 MG tablet Take 1 tablet (50 mg total) by mouth 3 (three) times daily. 270 tablet 3  . azelastine (ASTELIN) 0.1 % nasal spray Place 2 sprays into both nostrils 2 (two) times daily. Use in each nostril as directed 30 mL 0  . calcium carbonate 1250 MG capsule Take 1,000 mg by mouth daily.    . cetirizine (ZYRTEC) 10 MG tablet Take 10 mg by mouth daily.    . cholecalciferol (VITAMIN D) 1000 UNITS tablet Take 5,000 Units by mouth daily.    . clonazePAM (KLONOPIN) 1 MG tablet Take 3 mg by mouth at bedtime. May take 1/2 to 1 tablet during the day as needed     . cyclobenzaprine (FLEXERIL) 10 MG tablet Take 1 tablet (10 mg total) by mouth 3 (three) times daily as needed for muscle spasms. 30 tablet 0  . divalproex (DEPAKOTE ER) 500 MG 24 hr tablet Take 1,500 mg by mouth at bedtime.     Marland Kitchen eletriptan (  RELPAX) 40 MG tablet Take 1 tablet (40 mg total) by mouth as needed for migraine. 1 tab PO at onset of headache Repeat in 2 hours if headache persists or recurs 10 tablet 12  . empagliflozin (JARDIANCE) 10 MG TABS tablet Take 10 mg by mouth daily.    . fluticasone (FLONASE) 50 MCG/ACT nasal spray Place 2 sprays into both nostrils daily. 48 g 3  . gabapentin (NEURONTIN) 600 MG tablet Take 600 mg by mouth at bedtime.      . Insulin Lispro (HUMALOG KWIKPEN) 200 UNIT/ML SOPN Inject into the skin.    Marland Kitchen lamoTRIgine (LAMICTAL) 100 MG tablet Take 200 mg by mouth every morning.     Marland Kitchen levothyroxine (SYNTHROID, LEVOTHROID) 75 MCG tablet Take 75 mcg by mouth daily before breakfast.      . loperamide (IMODIUM) 2 MG capsule Take by mouth as needed for diarrhea or loose stools.    . Melatonin 10 MG CAPS Take by mouth.    . naproxen (NAPROSYN) 500 MG tablet Take 500 mg by mouth 2 (two) times daily as needed (for pain).     Marland Kitchen NEEDLE, DISP, 30 G 30G X 3/4" MISC Use as directed, daily blood sugars ( please dispense what patient had previously) 100 each 3  . omeprazole (PRILOSEC) 40 MG capsule Take 1 capsule (40 mg total) by mouth daily. 30 capsule 3  . ondansetron (ZOFRAN-ODT) 8 MG disintegrating tablet Take 0.5-1 tablets (4-8 mg total) by mouth every 8 (eight) hours as needed for nausea. 90 tablet 12  . ONETOUCH VERIO test strip   12  . OXcarbazepine (TRILEPTAL) 150 MG tablet Take 150 mg by mouth at bedtime.     . pravastatin (PRAVACHOL) 40 MG tablet Take 40 mg by mouth daily.    . pregabalin (LYRICA) 75 MG capsule Take 75 mg by mouth every morning.     . promethazine (PHENERGAN) 25 MG tablet Take 1 tablet (25 mg total) by mouth every 8 (eight) hours as needed for nausea or vomiting. 20 tablet 0  . sertraline (ZOLOFT) 25 MG tablet Take 50 mg by mouth daily.    Marland Kitchen topiramate (TOPAMAX) 50 MG tablet Take 1 tablet (50 mg total) by mouth 2 (two) times daily. 180 tablet 3  . TRESIBA FLEXTOUCH 200 UNIT/ML SOPN     . TRULICITY 1.5 OJ/5.0KX SOPN INJECT 1.5 MG WEEKLY FOR DIABETES  11  . azithromycin (ZITHROMAX) 250 MG tablet Sig as indicated (Patient not taking: Reported on 08/05/2017) 6 tablet 0   No current facility-administered medications on file prior to visit.    Past Surgical History:  Procedure Laterality Date  . DILATION AND CURETTAGE OF UTERUS    . ERCP  03/03/2011   Procedure: ENDOSCOPIC RETROGRADE CHOLANGIOPANCREATOGRAPHY (ERCP);  Surgeon: Lafayette Dragon, MD;  Location: [facility] ENDOSCOPY;  Service: Endoscopy;  Laterality: N/A;  . [place]  . TOE SURGERY Right 04/05/2017   right big toe   Allergies  Allergen Reactions  . Pyridostigmine Bromide Nausea And  Vomiting    "violently ill" "violently ill"  . Risperidone Other (See Comments)    liver damage "enlarged my liver"  . Metformin And Related     Severe GI distress  . Risperidone And Related Other (See Comments)    liver damage   Family History  Problem Relation Age of Onset  . Mental illness Father        Manic Depression  . Alcohol abuse Father   . Stroke Father   .  Heart disease Father   . Arthritis Son   . Depression Son   . Cancer Mother 11       peritoneal  . Breast cancer Maternal Grandmother   . Breast cancer Paternal Grandmother    Social History   Socioeconomic History  . Marital status: Married    Spouse name: [name] Mckeen  . Number of children: 2  . Years of education: 4  . Highest education level: Not on file  Occupational History  . Occupation: PROCESSING ASSISTANT    Employer: [employer]  Social Needs  . Financial resource strain: Not on file  . Food insecurity:    Worry: Not on file    Inability: Not on file  . Transportation needs:    Medical: Not on file    Non-medical: Not on file  Tobacco Use  . Smoking status: Never Smoker  . Smokeless tobacco: Never Used  Substance and Sexual Activity  . Alcohol use: Yes    Alcohol/week: 0.6 oz    Types: 1 Standard drinks or equivalent per week    Comment: once every couple months  . Drug use: No  . Sexual activity: Yes    Partners: Male    Birth control/protection: Post-menopausal  Lifestyle  . Physical activity:    Days per week: Not on file    Minutes per session: Not on file  . Stress: Not on file  Relationships  . Social connections:    Talks on phone: Not on file    Gets together: Not on file    Attends religious service: Not on file    Active member of club or organization: Not on file    Attends meetings of clubs or organizations: Not on file    Relationship status: Not on file  Other Topics Concern  . Not on file  Social History Narrative   Lives with her husband.   Their daughter, lives with them intermittently.  Her son is at [employer] and lives with them. Her husband has a child from a previous relationship who lives in [place].   Depression screen [facility] 2/9 08/12/2017 07/31/2017 07/22/2017 07/16/2017 07/04/2017  Decreased Interest 0 0 0 0 0  Down, Depressed, Hopeless 0 0 0 0 0  PHQ - 2 Score 0 0 0 0 0  Altered sleeping - - - - -  Tired, decreased energy - - - - -  Change in appetite - - - - -  Feeling bad or failure about yourself  - - - - -  Trouble concentrating - - - - -  Moving slowly or fidgety/restless - - - - -  Suicidal thoughts - - - - -  PHQ-9 Score - - - - -  Difficult doing work/chores - - - - -     Review of Systems  Constitutional: Positive for fatigue. Negative for chills and fever.  HENT: Positive for sinus pressure and sore throat (improved). Negative for ear pain (resolved), hearing loss and tinnitus.   Musculoskeletal: Positive for arthralgias, myalgias and neck stiffness. Negative for joint swelling.  Skin: Negative for color change.  Neurological: Positive for dizziness.      Objective:   Physical Exam  Constitutional: She is oriented to person, place, and time. She appears well-developed and well-nourished. No distress.  HENT:  Head: Normocephalic and atraumatic.  Right Ear: Tympanic membrane normal.  Left Ear: Tympanic membrane normal.  Nose: Nose normal.  Mouth/Throat: Posterior oropharyngeal erythema present. No oropharyngeal exudate.  No petechia.  Eyes: Conjunctivae and EOM are normal. Right eye exhibits no nystagmus (but positive symptoms). Left eye exhibits no nystagmus.  Neck: Neck supple. No tracheal deviation present.  Cardiovascular: Normal rate.  Pulmonary/Chest: Effort normal. No respiratory distress.  Musculoskeletal: Normal range of motion.  Lymphadenopathy:       Head (right side): Tonsillar (mild) adenopathy present.       Head (left side): Tonsillar (mild) adenopathy present.    She has no cervical  adenopathy.       Right: No supraclavicular adenopathy present.       Left: No supraclavicular adenopathy present.  Neurological: She is alert and oriented to person, place, and time.  Skin: Skin is warm and dry.  Psychiatric: She has a normal mood and affect. Her behavior is normal.  Nursing note and vitals reviewed.  BP 108/70 (BP Location: Left Arm, Patient Position: Sitting, Cuff Size: Normal)   Pulse 84   Temp 97.8 F (36.6 C) (Oral)   Resp 16   Ht 4' 11.75" (1.518 m)   Wt 189 lb 6.4 oz (85.9 kg)   SpO2 96%   BMI 37.30 kg/m     Results for orders placed or performed in visit on 08/05/17  POCT urinalysis dipstick  Result Value Ref Range   Color, UA yellow yellow   Clarity, UA clear clear   Glucose, UA =500 (A) negative mg/dL   Bilirubin, UA negative negative   Ketones, POC UA trace (5) (A) negative mg/dL   Spec Grav, UA 1.015 1.010 - 1.025   Blood, UA negative negative   pH, UA 6.0 5.0 - 8.0   Protein Ur, POC negative negative mg/dL   Urobilinogen, UA 1.0 0.2 or 1.0 E.U./dL   Nitrite, UA Negative Negative   Leukocytes, UA Negative Negative  POCT Microscopic Urinalysis ([facility])  Result Value Ref Range   WBC,UR,HPF,POC None None WBC/hpf   RBC,UR,HPF,POC None None RBC/hpf   Bacteria None None, Too numerous to count   Mucus Absent Absent   Epithelial Cells, UR Per Microscopy None None, Too numerous to count cells/hpf   Orthostatic VS for the past 24 hrs:  BP- Lying Pulse- Lying BP- Sitting Pulse- Sitting BP- Standing at 0 minutes Pulse- Standing at 0 minutes  08/05/17 1005 93/61 67 103/70 68 97/68 71   Assessment & Plan:   1. Acute pharyngitis, unspecified etiology   2. Type 2 diabetes mellitus with hyperglycemia, with long-term current use of insulin (HCC)   3. Dizziness and giddiness   4. Bilious vomiting with nausea     Orders Placed This Encounter  Procedures  . Basic metabolic panel    Order Specific Question:   Has the patient fasted?    Answer:   No  .  Orthostatic vital signs  . POCT CBC  . POCT urinalysis dipstick  . POCT Microscopic Urinalysis ([facility])  . POCT glucose (manual entry)  . POCT SEDIMENTATION RATE    Meds ordered this encounter  Medications  . meclizine (ANTIVERT) 25 MG tablet    Sig: Take 1 tablet (25 mg total) by mouth 3 (three) times daily as needed for dizziness.    Dispense:  30 tablet    Refill:  0  . ondansetron (ZOFRAN) injection 4 mg  . promethazine (PHENERGAN) injection 25 mg    I personally performed the services described in this documentation, which was scribed in my presence. The recorded information has been reviewed and considered, and addended by me as needed.   Delman Cheadle, M.D.  Primary Care at [facility] [address] [place], [place] [address] [phone] phone [phone] fax  08/12/17 9:06 AM

## 2017-08-12 ENCOUNTER — Other Ambulatory Visit: Payer: Self-pay

## 2017-08-12 ENCOUNTER — Ambulatory Visit: Payer: BC Managed Care – PPO | Admitting: Family Medicine

## 2017-08-12 ENCOUNTER — Encounter: Payer: Self-pay | Admitting: Family Medicine

## 2017-08-12 VITALS — BP 120/70 | HR 88 | Temp 97.7°F | Resp 17 | Ht 59.53 in | Wt 187.8 lb

## 2017-08-12 DIAGNOSIS — G43009 Migraine without aura, not intractable, without status migrainosus: Secondary | ICD-10-CM

## 2017-08-12 DIAGNOSIS — G7000 Myasthenia gravis without (acute) exacerbation: Secondary | ICD-10-CM | POA: Diagnosis not present

## 2017-08-12 DIAGNOSIS — M542 Cervicalgia: Secondary | ICD-10-CM

## 2017-08-12 LAB — POCT CBC
GRANULOCYTE PERCENT: 53.3 % (ref 37–80)
HCT, POC: 45.2 % (ref 37.7–47.9)
HEMOGLOBIN: 14.5 g/dL (ref 12.2–16.2)
Lymph, poc: 3.6 — AB (ref 0.6–3.4)
MCH: 29.3 pg (ref 27–31.2)
MCHC: 32.2 g/dL (ref 31.8–35.4)
MCV: 90.9 fL (ref 80–97)
MID (CBC): 0.5 (ref 0–0.9)
MPV: 7.7 fL (ref 0–99.8)
POC Granulocyte: 4.6 (ref 2–6.9)
POC LYMPH %: 41.3 % (ref 10–50)
POC MID %: 5.4 % (ref 0–12)
Platelet Count, POC: 356 10*3/uL (ref 142–424)
RBC: 4.97 M/uL (ref 4.04–5.48)
RDW, POC: 16.6 %
WBC: 8.6 10*3/uL (ref 4.6–10.2)

## 2017-08-12 LAB — POCT SEDIMENTATION RATE: POCT SED RATE: 34 mm/hr — AB (ref 0–22)

## 2017-08-12 MED ORDER — KETOROLAC TROMETHAMINE 60 MG/2ML IM SOLN
60.0000 mg | Freq: Once | INTRAMUSCULAR | Status: AC
  Administered 2017-08-12: 60 mg via INTRAMUSCULAR

## 2017-08-12 MED ORDER — PROMETHAZINE HCL 25 MG/ML IJ SOLN
25.0000 mg | Freq: Once | INTRAMUSCULAR | Status: AC
  Administered 2017-08-12: 25 mg via INTRAMUSCULAR

## 2017-08-12 MED ORDER — CYCLOBENZAPRINE HCL 10 MG PO TABS: 10.0000 mg | ORAL_TABLET | Freq: Three times a day (TID) | ORAL | 0 refills | Status: DC | PRN

## 2017-08-12 MED ORDER — RIZATRIPTAN BENZOATE 10 MG PO TBDP: 10.0000 mg | ORAL_TABLET | ORAL | 5 refills | Status: AC | PRN

## 2017-08-12 NOTE — Progress Notes (Signed)
Subjective:  By signing my name below, I, Michelle Torres, attest that this documentation has been prepared under the direction and in the presence of Delman Cheadle, MD. Electronically Signed: Moises Torres, [place]. 08/12/2017 , 9:52 AM .  Patient was seen in Room 3 .   Patient ID: Michelle Torres, female    DOB: 08/30/1960, 57 y.o.   MRN: [id number] Chief Complaint  Patient presents with  . Migraine    mild ha last night, at 6 am splitting open headache, tookd zofran and relpac and ice pack to see if would help as it usually does but did not, at 6:30-6:45 am extreme emesis  so she called to get appt with our office.  Saw Brigitte Pulse previously for vertigo that is better.  Pt say pain is on the right side of head and is radiating down her neck.  Pain level 8/10.  Pt will need dr note for work and says she will need tegretol and phenergan injection.   HPI Michelle Torres is a 57 y.o. female who presents to Primary Care at [facility] complaining of migraine headache. She's received Toradol and phenergan injection for relief in the past. She hasn't taken OTC medications for this. She's seen a neurologist at [facility] for myasthenia gravis. She's been on imuran for about 4-5 years. She can usually knock out her symptoms with Relpax if she catches it early. She developed migraines at around 57 years old, when she had her daughter.   She had c-spine xrays in 05/2016 that were normal. However, she does have a history of a neck pain with unclear etiology, likely caused by migraines and treated with flexeril.   Past Medical History:  Diagnosis Date  . Anxiety   . Bipolar disorder ([place])   . Closed fracture of proximal end of left humerus with routine healing, subsequent encounter 12/15/2015  . Depression   . Diabetes mellitus   . Fatty liver disease, nonalcoholic   . GERD (gastroesophageal reflux disease)   . High cholesterol   . Hypothyroidism    Past Surgical History:  Procedure Laterality Date  . DILATION AND  CURETTAGE OF UTERUS    . ERCP  03/03/2011   Procedure: ENDOSCOPIC RETROGRADE CHOLANGIOPANCREATOGRAPHY (ERCP);  Surgeon: Lafayette Dragon, MD;  Location: [facility] ENDOSCOPY;  Service: Endoscopy;  Laterality: N/A;  . [place]  . TOE SURGERY Right 04/05/2017   right big toe   Prior to Admission medications   Medication Sig Start Date End Date Taking? Authorizing Provider  acetic acid-hydrocortisone (VOSOL-HC) OTIC solution Place 3 drops into the left ear 3 (three) times daily. 07/31/17   Horald Pollen, MD  alosetron (LOTRONEX) 1 MG tablet Take 0.5 tablets (0.5 mg total) by mouth 2 (two) times daily. 03/19/17   Harrison Mons, PA-C  azaTHIOprine (IMURAN) 50 MG tablet Take 1 tablet (50 mg total) by mouth 3 (three) times daily. 03/19/17   Harrison Mons, PA-C  azelastine (ASTELIN) 0.1 % nasal spray Place 2 sprays into both nostrils 2 (two) times daily. Use in each nostril as directed 07/24/16   Harrison Mons, PA-C  azithromycin (ZITHROMAX) 250 MG tablet Sig as indicated Patient not taking: Reported on 08/05/2017 07/31/17   Horald Pollen, MD  calcium carbonate 1250 MG capsule Take 1,000 mg by mouth daily.    [provider]  cetirizine (ZYRTEC) 10 MG tablet Take 10 mg by mouth daily.    [provider]  cholecalciferol (VITAMIN D) 1000 UNITS tablet Take 5,000 Units  by mouth daily.    [provider]  clonazePAM (KLONOPIN) 1 MG tablet Take 3 mg by mouth at bedtime. May take 1/2 to 1 tablet during the day as needed     [provider]  cyclobenzaprine (FLEXERIL) 10 MG tablet Take 1 tablet (10 mg total) by mouth 3 (three) times daily as needed for muscle spasms. 07/16/17   Harrison Mons, PA-C  divalproex (DEPAKOTE ER) 500 MG 24 hr tablet Take 1,500 mg by mouth at bedtime.     [provider]  eletriptan (RELPAX) 40 MG tablet Take 1 tablet (40 mg total) by mouth as needed for migraine. 1 tab PO at onset of headache Repeat in 2 hours  if headache persists or recurs 07/04/17   Harrison Mons, PA-C  empagliflozin (JARDIANCE) 10 MG TABS tablet Take 10 mg by mouth daily.    [provider]  fluticasone (FLONASE) 50 MCG/ACT nasal spray Place 2 sprays into both nostrils daily. 07/04/17   Harrison Mons, PA-C  gabapentin (NEURONTIN) 600 MG tablet Take 600 mg by mouth at bedtime.      [provider]  Insulin Lispro (HUMALOG KWIKPEN) 200 UNIT/ML SOPN Inject into the skin.    [provider]  lamoTRIgine (LAMICTAL) 100 MG tablet Take 200 mg by mouth every morning.     [provider]  levothyroxine (SYNTHROID, LEVOTHROID) 75 MCG tablet Take 75 mcg by mouth daily before breakfast.    [provider]  loperamide (IMODIUM) 2 MG capsule Take by mouth as needed for diarrhea or loose stools.    [provider]  meclizine (ANTIVERT) 25 MG tablet Take 1 tablet (25 mg total) by mouth 3 (three) times daily as needed for dizziness. 08/05/17   Shawnee Knapp, MD  Melatonin 10 MG CAPS Take by mouth.    [provider]  naproxen (NAPROSYN) 500 MG tablet Take 500 mg by mouth 2 (two) times daily as needed (for pain).     [provider]  NEEDLE, DISP, 30 G 30G X 3/4" MISC Use as directed, daily Torres sugars ( please dispense what patient had previously) 07/14/13   Le, Thao P, DO  omeprazole (PRILOSEC) 40 MG capsule Take 1 capsule (40 mg total) by mouth daily. 02/08/14   Jeffery, Chelle, PA-C  ondansetron (ZOFRAN-ODT) 8 MG disintegrating tablet Take 0.5-1 tablets (4-8 mg total) by mouth every 8 (eight) hours as needed for nausea. 07/04/17   Harrison Mons, PA-C  ONETOUCH VERIO test strip  01/25/14   [provider]  OXcarbazepine (TRILEPTAL) 150 MG tablet Take 150 mg by mouth at bedtime.     [provider]  pravastatin (PRAVACHOL) 40 MG tablet Take 40 mg by mouth daily.    [provider]  pregabalin (LYRICA) 75 MG capsule Take 75 mg by mouth every morning.      [provider]  promethazine (PHENERGAN) 25 MG tablet Take 1 tablet (25 mg total) by mouth every 8 (eight) hours as needed for nausea or vomiting. 07/04/17   Harrison Mons, PA-C  sertraline (ZOLOFT) 25 MG tablet Take 50 mg by mouth daily.    [provider]  topiramate (TOPAMAX) 50 MG tablet Take 1 tablet (50 mg total) by mouth 2 (two) times daily. 03/27/16   Harrison Mons, PA-C  TRESIBA FLEXTOUCH 200 UNIT/ML SOPN  07/02/17   [provider]  TRULICITY 1.5 MP/5.3IR SOPN INJECT 1.5 MG WEEKLY FOR DIABETES 09/22/14   [provider]   Allergies  Allergen  Reactions  . Pyridostigmine Bromide Nausea And Vomiting    "violently ill" "violently ill"  . Risperidone Other (See Comments)    liver damage "enlarged my liver"  . Metformin And Related     Severe GI distress  . Risperidone And Related Other (See Comments)    liver damage   Family History  Problem Relation Age of Onset  . Mental illness Father        Manic Depression  . Alcohol abuse Father   . Stroke Father   . Heart disease Father   . Arthritis Son   . Depression Son   . Cancer Mother 35       peritoneal  . Breast cancer Maternal Grandmother   . Breast cancer Paternal Grandmother    Social History   Socioeconomic History  . Marital status: Married    Spouse name: [name] Zalesky  . Number of children: 2  . Years of education: 16  . Highest education level: Not on file  Occupational History  . Occupation: PROCESSING ASSISTANT    Employer: [employer]  Social Needs  . Financial resource strain: Not on file  . Food insecurity:    Worry: Not on file    Inability: Not on file  . Transportation needs:    Medical: Not on file    Non-medical: Not on file  Tobacco Use  . Smoking status: Never Smoker  . Smokeless tobacco: Never Used  Substance and Sexual Activity  . Alcohol use: Yes    Alcohol/week: 0.6 oz    Types: 1 Standard drinks or equivalent per week    Comment: once  every couple months  . Drug use: No  . Sexual activity: Yes    Partners: Male    Birth control/protection: Post-menopausal  Lifestyle  . Physical activity:    Days per week: Not on file    Minutes per session: Not on file  . Stress: Not on file  Relationships  . Social connections:    Talks on phone: Not on file    Gets together: Not on file    Attends religious service: Not on file    Active member of club or organization: Not on file    Attends meetings of clubs or organizations: Not on file    Relationship status: Not on file  Other Topics Concern  . Not on file  Social History Narrative   Lives with her husband.  Their daughter, lives with them intermittently.  Her son is at [facility] and lives with them. Her husband has a child from a previous relationship who lives in [place].   Depression screen [facility] 2/9 08/12/2017 07/31/2017 07/22/2017 07/16/2017 07/04/2017  Decreased Interest 0 0 0 0 0  Down, Depressed, Hopeless 0 0 0 0 0  PHQ - 2 Score 0 0 0 0 0  Altered sleeping - - - - -  Tired, decreased energy - - - - -  Change in appetite - - - - -  Feeling bad or failure about yourself  - - - - -  Trouble concentrating - - - - -  Moving slowly or fidgety/restless - - - - -  Suicidal thoughts - - - - -  PHQ-9 Score - - - - -  Difficult doing work/chores - - - - -    Review of Systems  Constitutional: Negative for chills, fatigue, fever and unexpected weight change.  Respiratory: Negative for cough.   Gastrointestinal: Negative for constipation, diarrhea, nausea and vomiting.  Skin: Negative for rash and wound.  Neurological: Positive for headaches. Negative for dizziness and weakness.       Objective:   Physical Exam  Constitutional: She is oriented to person, place, and time. She appears well-developed and well-nourished. No distress.  HENT:  Head: Normocephalic and atraumatic.  Right Ear: Tympanic membrane normal.  Left Ear: Tympanic membrane normal.  Nose: Nose normal.    Mouth/Throat: Oropharynx is clear and moist.  Eyes: Pupils are equal, round, and reactive to light. EOM are normal.  Neck: Neck supple.  Cardiovascular: Normal rate, regular rhythm and normal heart sounds.  Pulmonary/Chest: Effort normal and breath sounds normal. No respiratory distress.  Musculoskeletal: Normal range of motion.  Neurological: She is alert and oriented to person, place, and time.  positive increase in headache with hip flexion in supine position  Skin: Skin is warm and dry.  Psychiatric: She has a normal mood and affect. Her behavior is normal.  Nursing note and vitals reviewed.   BP 120/70 (BP Location: Left Arm, Patient Position: Sitting, Cuff Size: Normal)   Pulse 88   Temp 97.7 F (36.5 C) (Oral)   Resp 17   Ht 4' 11.53" (1.512 m)   Wt 187 lb 12.8 oz (85.2 kg)   SpO2 96%   BMI 37.26 kg/m      Results for orders placed or performed in visit on 08/12/17  POCT CBC  Result Value Ref Range   WBC 8.6 4.6 - 10.2 K/uL   Lymph, poc 3.6 (A) 0.6 - 3.4   POC LYMPH PERCENT 41.3 10 - 50 %L   MID (cbc) 0.5 0 - 0.9   POC MID % 5.4 0 - 12 %M   POC Granulocyte 4.6 2 - 6.9   Granulocyte percent 53.3 37 - 80 %G   RBC 4.97 4.04 - 5.48 M/uL   Hemoglobin 14.5 12.2 - 16.2 g/dL   HCT, POC 45.2 37.7 - 47.9 %   MCV 90.9 80 - 97 fL   MCH, POC 29.3 27 - 31.2 pg   MCHC 32.2 31.8 - 35.4 g/dL   RDW, POC 16.6 %   Platelet Count, POC 356 142 - 424 K/uL   MPV 7.7 0 - 99.8 fL  POCT SEDIMENTATION RATE  Result Value Ref Range   POCT SED RATE 34 (A) 0 - 22 mm/hr    Assessment & Plan:  most successful other than relpax is almo 12.5 or riza 10.  1. Migraine without aura and without status migrainosus, not intractable   2. Cervicalgia   3. Ocular myasthenia gravis ([place])   4. Neck pain     Orders Placed This Encounter  Procedures  . Ambulatory referral to Neurology    Referral Priority:   Routine    Referral Type:   Consultation    Referral Reason:   Specialty Services  Required    Requested Specialty:   Neurology    Number of Visits Requested:   1  . POCT CBC  . POCT SEDIMENTATION RATE    Meds ordered this encounter  Medications  . ketorolac (TORADOL) injection 60 mg  . promethazine (PHENERGAN) injection 25 mg  . rizatriptan (MAXALT-MLT) 10 MG disintegrating tablet    Sig: Take 1 tablet (10 mg total) by mouth as needed for migraine. May repeat in 2 hours if needed x 2 additional doses. Max 3 tabs/24hrs    Dispense:  10 tablet    Refill:  5    D/c relpax - ineffective  .  cyclobenzaprine (FLEXERIL) 10 MG tablet    Sig: Take 1 tablet (10 mg total) by mouth 3 (three) times daily as needed for muscle spasms.    Dispense:  30 tablet    Refill:  0   I personally performed the services described in this documentation, which was scribed in my presence. The recorded information has been reviewed and considered, and addended by me as needed.   Delman Cheadle, M.D.  Primary Care at [facility] [address] [place], [place] [address] [phone] phone [phone] fax  12/01/17 4:04 PM

## 2017-08-12 NOTE — Patient Instructions (Addendum)
Try the Maxalt melts instead of the Relpax - maybe it will work better for you. ALSO take a muscle relaxant flexeril followed by 15 minutes of heat followed by gentle stretching of your neck.  Wear a horseshoe neck pillow to help maintain alignment of your neck while your neck muscles relax. Try to do this heat and stretching regiment 2 - 3 times a day.  If you are still having neck pain in 4 to 6 wks, come back to clinic for further eval including possible repeat xrays and discuss further steps for treatment. RTC immed if symptoms worsen.   IF you received an x-ray today, you will receive an invoice from [facility]. Please contact [facility] at [phone] with questions or concerns regarding your invoice.   IF you received labwork today, you will receive an invoice from [facility]. Please contact [facility] at [phone] with questions or concerns regarding your invoice.   Our billing staff will not be able to assist you with questions regarding bills from these companies.  You will be contacted with the lab results as soon as they are available. The fastest way to get your results is to activate your My Chart account. Instructions are located on the last page of this paperwork. If you have not heard from [place] regarding the results in 2 weeks, please contact this office.     Cervical Strain and Sprain Rehab Ask your health care provider which exercises are safe for you. Do exercises exactly as told by your health care provider and adjust them as directed. It is normal to feel mild stretching, pulling, tightness, or discomfort as you do these exercises, but you should stop right away if you feel sudden pain or your pain gets worse.Do not begin these exercises until told by your health care provider. Stretching and range of motion exercises These exercises warm up your muscles and joints and improve the movement and flexibility of your neck. These exercises also help to relieve  pain, numbness, and tingling. Exercise A: Cervical side bend  1. Using good posture, sit on a stable chair or stand up. 2. Without moving your shoulders, slowly tilt your left / right ear to your shoulder until you feel a stretch in your neck muscles. You should be looking straight ahead. 3. Hold for __________ seconds. 4. Repeat with the other side of your neck. Repeat __________ times. Complete this exercise __________ times a day. Exercise B: Cervical rotation  1. Using good posture, sit on a stable chair or stand up. 2. Slowly turn your head to the side as if you are looking over your left / right shoulder. ? Keep your eyes level with the ground. ? Stop when you feel a stretch along the side and the back of your neck. 3. Hold for __________ seconds. 4. Repeat this by turning to your other side. Repeat __________ times. Complete this exercise __________ times a day. Exercise C: Thoracic extension and pectoral stretch 1. Roll a towel or a small blanket so it is about 4 inches (10 cm) in diameter. 2. Lie down on your back on a firm surface. 3. Put the towel lengthwise, under your spine in the middle of your back. It should not be not under your shoulder blades. The towel should line up with your spine from your middle back to your lower back. 4. Put your hands behind your head and let your elbows fall out to your sides. 5. Hold for __________ seconds. Repeat __________ times. Complete  this exercise __________ times a day. Strengthening exercises These exercises build strength and endurance in your neck. Endurance is the ability to use your muscles for a long time, even after your muscles get tired. Exercise D: Upper cervical flexion, isometric 1. Lie on your back with a thin pillow behind your head and a small rolled-up towel under your neck. 2. Gently tuck your chin toward your chest and nod your head down to look toward your feet. Do not lift your head off the pillow. 3. Hold for  __________ seconds. 4. Release the tension slowly. Relax your neck muscles completely before you repeat this exercise. Repeat __________ times. Complete this exercise __________ times a day. Exercise E: Cervical extension, isometric  1. Stand about 6 inches (15 cm) away from a wall, with your back facing the wall. 2. Place a soft object, about 6-8 inches (15-20 cm) in diameter, between the back of your head and the wall. A soft object could be a small pillow, a ball, or a folded towel. 3. Gently tilt your head back and press into the soft object. Keep your jaw and forehead relaxed. 4. Hold for __________ seconds. 5. Release the tension slowly. Relax your neck muscles completely before you repeat this exercise. Repeat __________ times. Complete this exercise __________ times a day. Posture and body mechanics  Body mechanics refers to the movements and positions of your body while you do your daily activities. Posture is part of body mechanics. Good posture and healthy body mechanics can help to relieve stress in your body's tissues and joints. Good posture means that your spine is in its natural S-curve position (your spine is neutral), your shoulders are pulled back slightly, and your head is not tipped forward. The following are general guidelines for applying improved posture and body mechanics to your everyday activities. Standing  When standing, keep your spine neutral and keep your feet about hip-width apart. Keep a slight bend in your knees. Your ears, shoulders, and hips should line up.  When you do a task in which you stand in one place for a long time, place one foot up on a stable object that is 2-4 inches (5-10 cm) high, such as a footstool. This helps keep your spine neutral. Sitting   When sitting, keep your spine neutral and your keep feet flat on the floor. Use a footrest, if necessary, and keep your thighs parallel to the floor. Avoid rounding your shoulders, and avoid tilting  your head forward.  When working at a desk or a computer, keep your desk at a height where your hands are slightly lower than your elbows. Slide your chair under your desk so you are close enough to maintain good posture.  When working at a computer, place your monitor at a height where you are looking straight ahead and you do not have to tilt your head forward or downward to look at the screen. Resting When lying down and resting, avoid positions that are most painful for you. Try to support your neck in a neutral position. You can use a contour pillow or a small rolled-up towel. Your pillow should support your neck but not push on it. This information is not intended to replace advice given to you by your health care provider. Make sure you discuss any questions you have with your health care provider. Document Released: 02/05/2005 Document Revised: 10/13/2015 Document Reviewed: 01/12/2015 Elsevier Interactive Patient Education  [employer].

## 2017-08-27 ENCOUNTER — Encounter

## 2017-08-27 ENCOUNTER — Ambulatory Visit: Payer: BC Managed Care – PPO | Admitting: Urgent Care

## 2017-08-27 ENCOUNTER — Encounter: Payer: Self-pay | Admitting: Urgent Care

## 2017-08-27 VITALS — BP 110/77 | HR 85 | Temp 97.9°F | Resp 18 | Ht 59.0 in | Wt 187.8 lb

## 2017-08-27 DIAGNOSIS — R112 Nausea with vomiting, unspecified: Secondary | ICD-10-CM

## 2017-08-27 DIAGNOSIS — G43009 Migraine without aura, not intractable, without status migrainosus: Secondary | ICD-10-CM

## 2017-08-27 MED ORDER — PROMETHAZINE HCL 12.5 MG RE SUPP: 12.5000 mg | Freq: Four times a day (QID) | RECTAL | 1 refills | Status: AC | PRN

## 2017-08-27 MED ORDER — ONDANSETRON 8 MG PO TBDP: 4.0000 mg | ORAL_TABLET | Freq: Three times a day (TID) | ORAL | 1 refills | Status: DC | PRN

## 2017-08-27 NOTE — Progress Notes (Signed)
    MRN: [id number] DOB: 1960-05-04  Subjective:   Michelle Torres is a 57 y.o. female presenting for acute onset of migraine this morning with associated nausea with vomiting (x4 episodes). Patient tried her Zofran, Phenergan suppository, rizatriptan with very good results. However, she missed work from her migraine. Patient has seen neurologist at [facility] for her longstanding history of migraines. She is being manage with azathioprine for this. She is currently seeing Dr. Delman Cheadle who switched her to rizatriptan. Patient reports being asymptomatic currently, states that her standard medications resolved her symptoms today, needs a work note.  Also, requests refills of Zofran and phenergan suppositories.   Mylene has a current medication list which includes the following prescription(s): acetic acid-hydrocortisone, alosetron, azathioprine, azelastine, calcium carbonate, cetirizine, cholecalciferol, clonazepam, cyclobenzaprine, divalproex, eletriptan, empagliflozin, fluticasone, gabapentin, insulin lispro, lamotrigine, levothyroxine, loperamide, meclizine, melatonin, naproxen, needle (disp) 30 g, omeprazole, ondansetron, onetouch verio, oxcarbazepine, pravastatin, pregabalin, promethazine, rizatriptan, sertraline, topiramate, tresiba flextouch, and trulicity. Also is allergic to pyridostigmine bromide; risperidone; metformin and related; and risperidone and related.  Takeya  has a past medical history of Anxiety, Bipolar disorder ([place]), Closed fracture of proximal end of left humerus with routine healing, subsequent encounter (12/15/2015), Depression, Diabetes mellitus, Fatty liver disease, nonalcoholic, GERD (gastroesophageal reflux disease), High cholesterol, and Hypothyroidism. Also  has a past surgical history that includes Laparoscopic cholecystectomy (1993); Dilation and curettage of uterus; ERCP (03/03/2011); and Toe Surgery (Right, 04/05/2017).  Objective:   Vitals: BP 110/77   Pulse 85   Temp  97.9 F (36.6 C) (Oral)   Resp 18   Ht 4\' 11"  (1.499 m)   Wt 187 lb 12.8 oz (85.2 kg)   SpO2 95%   BMI 37.93 kg/m   Physical Exam  Constitutional: She is oriented to person, place, and time. She appears well-developed and well-nourished.  Cardiovascular: Normal rate.  Pulmonary/Chest: Effort normal.  Neurological: She is alert and oriented to person, place, and time.  Psychiatric: She has a normal mood and affect.   Assessment and Plan :   Migraine without aura and without status migrainosus, not intractable - Plan: ondansetron (ZOFRAN-ODT) 8 MG disintegrating tablet  Nausea and vomiting, intractability of vomiting not specified, unspecified vomiting type  Plans on setting follow up with Dr. Brigitte Pulse for continued management of her migraines. ER and rtc precautions reviewed.  Jaynee Eagles, PA-C Primary Care at [facility] [phone] 08/27/2017  3:06 PM

## 2017-08-27 NOTE — Patient Instructions (Addendum)
Migraine Headache A migraine headache is an intense, throbbing pain on one side or both sides of the head. Migraines may also cause other symptoms, such as nausea, vomiting, and sensitivity to light and noise. What are the causes? Doing or taking certain things may also trigger migraines, such as:  Alcohol.  Smoking.  Medicines, such as: ? Medicine used to treat chest pain (nitroglycerine). ? Birth control pills. ? Estrogen pills. ? Certain blood pressure medicines.  Aged cheeses, chocolate, or caffeine.  Foods or drinks that contain nitrates, glutamate, aspartame, or tyramine.  Physical activity.  Other things that may trigger a migraine include:  Menstruation.  Pregnancy.  Hunger.  Stress, lack of sleep, too much sleep, or fatigue.  Weather changes.  What increases the risk? The following factors may make you more likely to experience migraine headaches:  Age. Risk increases with age.  Family history of migraine headaches.  Being Caucasian.  Depression and anxiety.  Obesity.  Being a woman.  Having a hole in the heart (patent foramen ovale) or other heart problems.  What are the signs or symptoms? The main symptom of this condition is pulsating or throbbing pain. Pain may:  Happen in any area of the head, such as on one side or both sides.  Interfere with daily activities.  Get worse with physical activity.  Get worse with exposure to bright lights or loud noises.  Other symptoms may include:  Nausea.  Vomiting.  Dizziness.  General sensitivity to bright lights, loud noises, or smells.  Before you get a migraine, you may get warning signs that a migraine is developing (aura). An aura may include:  Seeing flashing lights or having blind spots.  Seeing bright spots, halos, or zigzag lines.  Having tunnel vision or blurred vision.  Having numbness or a tingling feeling.  Having trouble talking.  Having muscle weakness.  How is this  diagnosed? A migraine headache can be diagnosed based on:  Your symptoms.  A physical exam.  Tests, such as CT scan or MRI of the head. These imaging tests can help rule out other causes of headaches.  Taking fluid from the spine (lumbar puncture) and analyzing it (cerebrospinal fluid analysis, or CSF analysis).  How is this treated? A migraine headache is usually treated with medicines that:  Relieve pain.  Relieve nausea.  Prevent migraines from coming back.  Treatment may also include:  Acupuncture.  Lifestyle changes like avoiding foods that trigger migraines.  Follow these instructions at home: Medicines  Take over-the-counter and prescription medicines only as told by your health care provider.  Do not drive or use heavy machinery while taking prescription pain medicine.  To prevent or treat constipation while you are taking prescription pain medicine, your health care provider may recommend that you: ? Drink enough fluid to keep your urine clear or pale yellow. ? Take over-the-counter or prescription medicines. ? Eat foods that are high in fiber, such as fresh fruits and vegetables, whole grains, and beans. ? Limit foods that are high in fat and processed sugars, such as fried and sweet foods. Lifestyle  Avoid alcohol use.  Do not use any products that contain nicotine or tobacco, such as cigarettes and e-cigarettes. If you need help quitting, ask your health care provider.  Get at least 8 hours of sleep every night.  Limit your stress. General instructions   Keep a journal to find out what may trigger your migraine headaches. For example, write down: ? What you eat and   drink. ? How much sleep you get. ? Any change to your diet or medicines.  If you have a migraine: ? Avoid things that make your symptoms worse, such as bright lights. ? It may help to lie down in a dark, quiet room. ? Do not drive or use heavy machinery. ? Ask your health care provider  what activities are safe for you while you are experiencing symptoms.  Keep all follow-up visits as told by your health care provider. This is important. Contact a health care provider if:  You develop symptoms that are different or more severe than your usual migraine symptoms. Get help right away if:  Your migraine becomes severe.  You have a fever.  You have a stiff neck.  You have vision loss.  Your muscles feel weak or like you cannot control them.  You start to lose your balance often.  You develop trouble walking.  You faint. This information is not intended to replace advice given to you by your health care provider. Make sure you discuss any questions you have with your health care provider. Document Released: 02/05/2005 Document Revised: 08/26/2015 Document Reviewed: 07/25/2015 Elsevier Interactive Patient Education  2017 [employer].     IF you received an x-ray today, you will receive an invoice from [facility]. Please contact [facility] at [phone] with questions or concerns regarding your invoice.   IF you received labwork today, you will receive an invoice from [place]. Please contact [facility] at [phone] with questions or concerns regarding your invoice.   Our billing staff will not be able to assist you with questions regarding bills from these companies.  You will be contacted with the lab results as soon as they are available. The fastest way to get your results is to activate your My Chart account. Instructions are located on the last page of this paperwork. If you have not heard from [place] regarding the results in 2 weeks, please contact this office.

## 2017-09-11 ENCOUNTER — Other Ambulatory Visit: Payer: Self-pay

## 2017-09-11 ENCOUNTER — Ambulatory Visit: Payer: BC Managed Care – PPO | Admitting: Family Medicine

## 2017-09-11 ENCOUNTER — Ambulatory Visit: Payer: BC Managed Care – PPO | Admitting: Emergency Medicine

## 2017-09-11 ENCOUNTER — Encounter: Payer: Self-pay | Admitting: Family Medicine

## 2017-09-11 VITALS — BP 109/75 | HR 81 | Temp 98.9°F | Resp 17 | Ht 59.0 in | Wt 186.4 lb

## 2017-09-11 DIAGNOSIS — E1165 Type 2 diabetes mellitus with hyperglycemia: Secondary | ICD-10-CM

## 2017-09-11 DIAGNOSIS — R42 Dizziness and giddiness: Secondary | ICD-10-CM

## 2017-09-11 DIAGNOSIS — E039 Hypothyroidism, unspecified: Secondary | ICD-10-CM | POA: Diagnosis not present

## 2017-09-11 DIAGNOSIS — G43009 Migraine without aura, not intractable, without status migrainosus: Secondary | ICD-10-CM

## 2017-09-11 DIAGNOSIS — Z794 Long term (current) use of insulin: Secondary | ICD-10-CM | POA: Diagnosis not present

## 2017-09-11 LAB — POCT URINALYSIS DIP (MANUAL ENTRY)
Bilirubin, UA: NEGATIVE
Blood, UA: NEGATIVE
Ketones, POC UA: NEGATIVE mg/dL
NITRITE UA: NEGATIVE
Protein Ur, POC: NEGATIVE mg/dL
Spec Grav, UA: 1.01 (ref 1.010–1.025)
Urobilinogen, UA: 0.2 E.U./dL
pH, UA: 7.5 (ref 5.0–8.0)

## 2017-09-11 LAB — POCT CBC
GRANULOCYTE PERCENT: 4.4 % — AB (ref 37–80)
HEMATOCRIT: 44.6 % (ref 37.7–47.9)
HEMOGLOBIN: 14.4 g/dL (ref 12.2–16.2)
LYMPH, POC: 34.5 — AB (ref 0.6–3.4)
MCH, POC: 29.1 pg (ref 27–31.2)
MCHC: 32.3 g/dL (ref 31.8–35.4)
MCV: 90.1 fL (ref 80–97)
MID (cbc): 58.7 — AB (ref 0–0.9)
MPV: 7.7 fL (ref 0–99.8)
POC GRANULOCYTE: 0.5 — AB (ref 2–6.9)
POC LYMPH PERCENT: 6.8 %L — AB (ref 10–50)
POC MID %: 2.6 %M (ref 0–12)
Platelet Count, POC: 333 10*3/uL (ref 142–424)
RBC: 4.94 M/uL (ref 4.04–5.48)
RDW, POC: 15.6 %
WBC: 7.5 10*3/uL (ref 4.6–10.2)

## 2017-09-11 LAB — POCT GLYCOSYLATED HEMOGLOBIN (HGB A1C): HEMOGLOBIN A1C: 7 % — AB (ref 4.0–5.6)

## 2017-09-11 LAB — GLUCOSE, POCT (MANUAL RESULT ENTRY): POC Glucose: 116 mg/dl — AB (ref 70–99)

## 2017-09-11 NOTE — Progress Notes (Signed)
Chief Complaint  Patient presents with  . Dizziness    x this morning at 7:30 am.  Zofran twice and ha med and meclizine.  Nausea is better but dizziness is the same.      HPI  Dizziness and migraine Pt reports that this morning out of the blue she started having dizziness She reports that this morning she woke up at 6:30am and felt like she was having a headache and took her meclizine this morning for her migraine She states that she was feeling nauseous so she took a zofran By 10:30am she took a second migraine but still feeling nauseous She started feeling dizzy about 10:30am and then took a meclizine. She feels better laying than standing She feels like she leans to one side with standing.  She feels like since has been bloated since her IUD was placed in 2016  Diabetes  She has a history of diabetes Lab Results  Component Value Date   HGBA1C 7.0 (A) 09/11/2017   She states that she sees Dr. Elyse Hsu for Endocrinology She reports that her meter was not working this morning. She normally has blood glucose in the 70s - 80s.   Hypothyroidism Lab Results  Component Value Date   TSH 0.830 07/04/2017   She reports that she sees Dr. Elyse Hsu  She takes levothyroxine and is compliant with her medication She states that she has normal heart rate, no palpitations    Past Medical History:  Diagnosis Date  . Anxiety   . Bipolar disorder ([place])   . Closed fracture of proximal end of left humerus with routine healing, subsequent encounter 12/15/2015  . Depression   . Diabetes mellitus   . Fatty liver disease, nonalcoholic   . GERD (gastroesophageal reflux disease)   . High cholesterol   . Hypothyroidism     Current Outpatient Medications  Medication Sig Dispense Refill  . acetic acid-hydrocortisone (VOSOL-HC) OTIC solution Place 3 drops into the left ear 3 (three) times daily. 10 mL 1  . alosetron (LOTRONEX) 1 MG tablet Take 0.5 tablets (0.5 mg total) by mouth 2 (two)  times daily. 90 tablet 1  . azaTHIOprine (IMURAN) 50 MG tablet Take 1 tablet (50 mg total) by mouth 3 (three) times daily. 270 tablet 3  . azelastine (ASTELIN) 0.1 % nasal spray Place 2 sprays into both nostrils 2 (two) times daily. Use in each nostril as directed 30 mL 0  . calcium carbonate 1250 MG capsule Take 1,000 mg by mouth daily.    . cetirizine (ZYRTEC) 10 MG tablet Take 10 mg by mouth daily.    . cholecalciferol (VITAMIN D) 1000 UNITS tablet Take 5,000 Units by mouth daily.    . clonazePAM (KLONOPIN) 1 MG tablet Take 3 mg by mouth at bedtime. May take 1/2 to 1 tablet during the day as needed     . cyclobenzaprine (FLEXERIL) 10 MG tablet Take 1 tablet (10 mg total) by mouth 3 (three) times daily as needed for muscle spasms. 30 tablet 0  . divalproex (DEPAKOTE ER) 500 MG 24 hr tablet Take 1,500 mg by mouth at bedtime.     Marland Kitchen eletriptan (RELPAX) 40 MG tablet Take 1 tablet (40 mg total) by mouth as needed for migraine. 1 tab PO at onset of headache Repeat in 2 hours if headache persists or recurs (Patient not taking: Reported on 09/11/2017) 10 tablet 12  . empagliflozin (JARDIANCE) 10 MG TABS tablet Take 10 mg by mouth daily.    . fluticasone (  FLONASE) 50 MCG/ACT nasal spray Place 2 sprays into both nostrils daily. 48 g 3  . gabapentin (NEURONTIN) 600 MG tablet Take 600 mg by mouth at bedtime.      . Insulin Lispro (HUMALOG KWIKPEN) 200 UNIT/ML SOPN Inject into the skin.    Marland Kitchen lamoTRIgine (LAMICTAL) 100 MG tablet Take 200 mg by mouth every morning.     Marland Kitchen levothyroxine (SYNTHROID, LEVOTHROID) 75 MCG tablet Take 75 mcg by mouth daily before breakfast.    . loperamide (IMODIUM) 2 MG capsule Take by mouth as needed for diarrhea or loose stools.    . meclizine (ANTIVERT) 25 MG tablet Take 1 tablet (25 mg total) by mouth 3 (three) times daily as needed for dizziness. 30 tablet 0  . Melatonin 10 MG CAPS Take by mouth.    . naproxen (NAPROSYN) 500 MG tablet Take 500 mg by mouth 2 (two) times daily as  needed (for pain).     Marland Kitchen NEEDLE, DISP, 30 G 30G X 3/4" MISC Use as directed, daily blood sugars ( please dispense what patient had previously) 100 each 3  . omeprazole (PRILOSEC) 40 MG capsule Take 1 capsule (40 mg total) by mouth daily. 30 capsule 3  . ondansetron (ZOFRAN-ODT) 8 MG disintegrating tablet Take 0.5-1 tablets (4-8 mg total) by mouth every 8 (eight) hours as needed for nausea or vomiting. 30 tablet 1  . ONETOUCH VERIO test strip   12  . OXcarbazepine (TRILEPTAL) 150 MG tablet Take 150 mg by mouth at bedtime.     . pravastatin (PRAVACHOL) 40 MG tablet Take 40 mg by mouth daily.    . pregabalin (LYRICA) 75 MG capsule Take 75 mg by mouth every morning.     . promethazine (PHENERGAN) 12.5 MG suppository Place 1 suppository (12.5 mg total) rectally every 6 (six) hours as needed for nausea or vomiting. 10 suppository 1  . promethazine (PHENERGAN) 25 MG tablet Take 1 tablet (25 mg total) by mouth every 8 (eight) hours as needed for nausea or vomiting. 20 tablet 0  . rizatriptan (MAXALT-MLT) 10 MG disintegrating tablet Take 1 tablet (10 mg total) by mouth as needed for migraine. May repeat in 2 hours if needed x 2 additional doses. Max 3 tabs/24hrs 10 tablet 5  . sertraline (ZOLOFT) 25 MG tablet Take 50 mg by mouth daily.    Marland Kitchen topiramate (TOPAMAX) 50 MG tablet Take 1 tablet (50 mg total) by mouth 2 (two) times daily. 180 tablet 3  . TRESIBA FLEXTOUCH 200 UNIT/ML SOPN     . TRULICITY 1.5 JK/9.3OI SOPN INJECT 1.5 MG WEEKLY FOR DIABETES  11   No current facility-administered medications for this visit.     Allergies:  Allergies  Allergen Reactions  . Pyridostigmine Bromide Nausea And Vomiting    "violently ill" "violently ill"  . Risperidone Other (See Comments)    liver damage "enlarged my liver"  . Metformin And Related     Severe GI distress  . Risperidone And Related Other (See Comments)    liver damage    Past Surgical History:  Procedure Laterality Date  . DILATION AND  CURETTAGE OF UTERUS    . ERCP  03/03/2011   Procedure: ENDOSCOPIC RETROGRADE CHOLANGIOPANCREATOGRAPHY (ERCP);  Surgeon: Lafayette Dragon, MD;  Location: [facility] ENDOSCOPY;  Service: Endoscopy;  Laterality: N/A;  . [place]  . TOE SURGERY Right 04/05/2017   right big toe    Social History   Socioeconomic History  . Marital status: Married  Spouse name: Teia Freitas  . Number of children: 2  . Years of education: 57  . Highest education level: Not on file  Occupational History  . Occupation: PROCESSING ASSISTANT    Employer: [employer]  Social Needs  . Financial resource strain: Not on file  . Food insecurity:    Worry: Not on file    Inability: Not on file  . Transportation needs:    Medical: Not on file    Non-medical: Not on file  Tobacco Use  . Smoking status: Never Smoker  . Smokeless tobacco: Never Used  Substance and Sexual Activity  . Alcohol use: Yes    Alcohol/week: 0.6 oz    Types: 1 Standard drinks or equivalent per week    Comment: once every couple months  . Drug use: No  . Sexual activity: Yes    Partners: Male    Birth control/protection: Post-menopausal  Lifestyle  . Physical activity:    Days per week: Not on file    Minutes per session: Not on file  . Stress: Not on file  Relationships  . Social connections:    Talks on phone: Not on file    Gets together: Not on file    Attends religious service: Not on file    Active member of club or organization: Not on file    Attends meetings of clubs or organizations: Not on file    Relationship status: Not on file  Other Topics Concern  . Not on file  Social History Narrative   Lives with her husband.  Their daughter, lives with them intermittently.  Her son is at [facility] and lives with them. Her husband has a child from a previous relationship who lives in [place].    Family History  Problem Relation Age of Onset  . Mental illness Father        Manic Depression  . Alcohol  abuse Father   . Stroke Father   . Heart disease Father   . Arthritis Son   . Depression Son   . Cancer Mother 84       peritoneal  . Breast cancer Maternal Grandmother   . Breast cancer Paternal Grandmother      ROS Review of Systems See HPI Constitution: No fevers or chills No malaise No diaphoresis Skin: No rash or itching Eyes: no blurry vision, no double vision GU: no dysuria or hematuria Neuro: see hpi all others reviewed and negative   Objective: Vitals:   09/11/17 1326  BP: 109/75  Pulse: 81  Resp: 17  Temp: 98.9 F (37.2 C)  TempSrc: Oral  SpO2: 98%  Weight: 186 lb 6.4 oz (84.6 kg)  Height: 4\' 11"  (1.499 m)   BP Readings from Last 3 Encounters:  09/11/17 109/75  08/27/17 110/77  08/12/17 120/70    Physical Exam  Constitutional: She is oriented to person, place, and time. She appears well-developed and well-nourished.  HENT:  Head: Normocephalic and atraumatic.  Eyes: Pupils are equal, round, and reactive to light. Conjunctivae and EOM are normal. Right eye exhibits normal extraocular motion and no nystagmus. Left eye exhibits normal extraocular motion and no nystagmus.  Cardiovascular: Normal rate, regular rhythm and normal heart sounds.  No murmur heard. Pulmonary/Chest: Effort normal and breath sounds normal. No stridor. No respiratory distress. She has no wheezes. She has no rales.  Neurological: She is alert and oriented to person, place, and time. She has normal strength. No cranial nerve deficit or sensory deficit.  Coordination normal.  Reflex Scores:      Brachioradialis reflexes are 2+ on the right side and 2+ on the left side.      Patellar reflexes are 2+ on the right side and 2+ on the left side. No nystagmus on either side  Skin: Skin is warm. Capillary refill takes less than 2 seconds.  Psychiatric: She has a normal mood and affect. Her behavior is normal. Judgment and thought content normal.     EKG  Nsr, no st elevation, no  twi   Assessment and Plan Betrice was seen today for dizziness.  Diagnoses and all orders for this visit:  Dizziness and giddiness- no evidence of dehydration, hypoglycemia or UTI Continue current meds Most likely benign vertigo as her symptoms are positional ER precautions reviewed -     EKG 12-Lead -     POCT CBC -     POCT glycosylated hemoglobin (Hb A1C) -     POCT glucose (manual entry) -     POCT urinalysis dipstick -     POCT Microscopic Urinalysis ([facility]) -     TSH + free T4 -     Comprehensive metabolic panel  Type 2 diabetes mellitus with hyperglycemia, with long-term current use of insulin ([place])- well controlled hemoglobin a1c is at goal Continue current meds  -     POCT glycosylated hemoglobin (Hb A1C) -     POCT glucose (manual entry) -     Comprehensive metabolic panel  Migraine without aura and without status migrainosus, not intractable  Acquired hypothyroidism- will assess levels, pt is compliant -     TSH + free T4 -     Comprehensive metabolic panel     Daxtyn Rottenberg A Baelynn Schmuhl

## 2017-09-11 NOTE — Patient Instructions (Addendum)
Continue meclizine Maintain appropriate hydration Continue to eat regular meals    IF you received an x-ray today, you will receive an invoice from [facility]. Please contact [facility] at [phone] with questions or concerns regarding your invoice.   IF you received labwork today, you will receive an invoice from [facility]. Please contact [facility] at [phone] with questions or concerns regarding your invoice.   Our billing staff will not be able to assist you with questions regarding bills from these companies.  You will be contacted with the lab results as soon as they are available. The fastest way to get your results is to activate your My Chart account. Instructions are located on the last page of this paperwork. If you have not heard from [place] regarding the results in 2 weeks, please contact this office.    Dizziness Dizziness is a common problem. It is a feeling of unsteadiness or light-headedness. You may feel like you are about to faint. Dizziness can lead to injury if you stumble or fall. Anyone can become dizzy, but dizziness is more common in older adults. This condition can be caused by a number of things, including medicines, dehydration, or illness. Follow these instructions at home: Eating and drinking  Drink enough fluid to keep your urine clear or pale yellow. This helps to keep you from becoming dehydrated. Try to drink more clear fluids, such as water.  Do not drink alcohol.  Limit your caffeine intake if told to do so by your health care provider. Check ingredients and nutrition facts to see if a food or beverage contains caffeine.  Limit your salt (sodium) intake if told to do so by your health care provider. Check ingredients and nutrition facts to see if a food or beverage contains sodium. Activity  Avoid making quick movements. ? Rise slowly from chairs and steady yourself until you feel okay. ? In the morning, first sit up on the side  of the bed. When you feel okay, stand slowly while you hold onto something until you know that your balance is fine.  If you need to stand in one place for a long time, move your legs often. Tighten and relax the muscles in your legs while you are standing.  Do not drive or use heavy machinery if you feel dizzy.  Avoid bending down if you feel dizzy. Place items in your home so that they are easy for you to reach without leaning over. Lifestyle  Do not use any products that contain nicotine or tobacco, such as cigarettes and e-cigarettes. If you need help quitting, ask your health care provider.  Try to reduce your stress level by using methods such as yoga or meditation. Talk with your health care provider if you need help to manage your stress. General instructions  Watch your dizziness for any changes.  Take over-the-counter and prescription medicines only as told by your health care provider. Talk with your health care provider if you think that your dizziness is caused by a medicine that you are taking.  Tell a friend or a family member that you are feeling dizzy. If he or she notices any changes in your behavior, have this person call your health care provider.  Keep all follow-up visits as told by your health care provider. This is important. Contact a health care provider if:  Your dizziness does not go away.  Your dizziness or light-headedness gets worse.  You feel nauseous.  You have reduced hearing.  You have  new symptoms.  You are unsteady on your feet or you feel like the room is spinning. Get help right away if:  You vomit or have diarrhea and are unable to eat or drink anything.  You have problems talking, walking, swallowing, or using your arms, hands, or legs.  You feel generally weak.  You are not thinking clearly or you have trouble forming sentences. It may take a friend or family member to notice this.  You have chest pain, abdominal pain, shortness of  breath, or sweating.  Your vision changes.  You have any bleeding.  You have a severe headache.  You have neck pain or a stiff neck.  You have a fever. These symptoms may represent a serious problem that is an emergency. Do not wait to see if the symptoms will go away. Get medical help right away. Call your local emergency services (911 in the [place] Do not drive yourself to the hospital. Summary  Dizziness is a feeling of unsteadiness or light-headedness. This condition can be caused by a number of things, including medicines, dehydration, or illness.  Anyone can become dizzy, but dizziness is more common in older adults.  Drink enough fluid to keep your urine clear or pale yellow. Do not drink alcohol.  Avoid making quick movements if you feel dizzy. Monitor your dizziness for any changes. This information is not intended to replace advice given to you by your health care provider. Make sure you discuss any questions you have with your health care provider. Document Released: 08/01/2000 Document Revised: 03/10/2016 Document Reviewed: 03/10/2016 Elsevier Interactive Patient Education  [employer].

## 2017-09-12 LAB — COMPREHENSIVE METABOLIC PANEL
ALBUMIN: 4.4 g/dL (ref 3.5–5.5)
ALK PHOS: 94 IU/L (ref 39–117)
ALT: 24 IU/L (ref 0–32)
AST: 27 IU/L (ref 0–40)
Albumin/Globulin Ratio: 1.5 (ref 1.2–2.2)
BILIRUBIN TOTAL: 0.4 mg/dL (ref 0.0–1.2)
BUN/Creatinine Ratio: 14 (ref 9–23)
BUN: 10 mg/dL (ref 6–24)
CALCIUM: 10 mg/dL (ref 8.7–10.2)
CHLORIDE: 103 mmol/L (ref 96–106)
CO2: 25 mmol/L (ref 20–29)
Creatinine, Ser: 0.74 mg/dL (ref 0.57–1.00)
GFR calc Af Amer: 105 mL/min/{1.73_m2} (ref >59)
GFR calc non Af Amer: 91 mL/min/{1.73_m2} (ref >59)
GLOBULIN, TOTAL: 2.9 g/dL (ref 1.5–4.5)
Glucose: 110 mg/dL — ABNORMAL HIGH (ref 65–99)
Potassium: 4.7 mmol/L (ref 3.5–5.2)
SODIUM: 142 mmol/L (ref 134–144)
Total Protein: 7.3 g/dL (ref 6.0–8.5)

## 2017-09-12 LAB — TSH+FREE T4
Free T4: 1.63 ng/dL (ref 0.82–1.77)
TSH: 0.52 u[IU]/mL (ref 0.450–4.500)

## 2017-11-02 ENCOUNTER — Other Ambulatory Visit: Payer: Self-pay | Admitting: Emergency Medicine

## 2017-11-02 DIAGNOSIS — H6690 Otitis media, unspecified, unspecified ear: Secondary | ICD-10-CM

## 2017-11-02 DIAGNOSIS — J029 Acute pharyngitis, unspecified: Secondary | ICD-10-CM

## 2017-11-14 ENCOUNTER — Ambulatory Visit (INDEPENDENT_AMBULATORY_CARE_PROVIDER_SITE_OTHER): Payer: BC Managed Care – PPO | Admitting: Family Medicine

## 2017-11-14 ENCOUNTER — Other Ambulatory Visit: Payer: Self-pay

## 2017-11-14 ENCOUNTER — Encounter: Payer: Self-pay | Admitting: Family Medicine

## 2017-11-14 VITALS — BP 129/65 | HR 73 | Temp 97.4°F | Resp 16 | Ht 59.0 in | Wt 196.2 lb

## 2017-11-14 DIAGNOSIS — J029 Acute pharyngitis, unspecified: Secondary | ICD-10-CM | POA: Diagnosis not present

## 2017-11-14 LAB — POCT RAPID STREP A (OFFICE): Rapid Strep A Screen: NEGATIVE

## 2017-11-14 NOTE — Patient Instructions (Signed)

## 2017-11-14 NOTE — Progress Notes (Signed)
9/26/201912:17 PM  Michelle Torres 07/12/1960, 57 y.o. female [id number]  Chief Complaint  Patient presents with  . Sore Throat    Onset: tuesday, more soreness on Wed, no voice last night -prone to laryngitis, Woke up this morning w/ no voice.   Very fatigued, no appetite, regular voids and bm's.  And will need work note to return to work on tomorrow    HPI:   Patient is a 57 y.o. female with past medical history significant for DM2, HTN, HLP, hypothyroidism who presents today for sore throat  2 days ago started to notice sore throat and decreased voice Has been drinking lots of water and salt gargles Feeling very tired, decreased appetite Very mild non productive cough, mild headache Has no ear pain, no fever, or chills, no nausea, vomiting or diarrhea Has not taken any APAP or NSAIDS today Prone to laryngitis due to job No sick contacts  Fall Risk  11/14/2017 09/11/2017 08/12/2017 07/31/2017 07/22/2017  Falls in the past year? No No No No No  Number falls in past yr: - - - - -  Injury with Fall? - - - - -  Comment - - - - -     Depression screen [facility] 2/9 11/14/2017 09/11/2017 08/12/2017  Decreased Interest 0 0 0  Down, Depressed, Hopeless 0 0 0  PHQ - 2 Score 0 0 0  Altered sleeping - - -  Tired, decreased energy - - -  Change in appetite - - -  Feeling bad or failure about yourself  - - -  Trouble concentrating - - -  Moving slowly or fidgety/restless - - -  Suicidal thoughts - - -  PHQ-9 Score - - -  Difficult doing work/chores - - -    Allergies  Allergen Reactions  . Pyridostigmine Bromide Nausea And Vomiting    "violently ill" "violently ill"  . Risperidone Other (See Comments)    liver damage "enlarged my liver"  . Metformin And Related     Severe GI distress  . Risperidone And Related Other (See Comments)    liver damage    Prior to Admission medications   Medication Sig Start Date End Date Taking? Authorizing Provider  alosetron (LOTRONEX) 1 MG tablet  Take 0.5 tablets (0.5 mg total) by mouth 2 (two) times daily. 03/19/17  Yes Jeffery, Domingo Mend, PA  azaTHIOprine (IMURAN) 50 MG tablet Take 1 tablet (50 mg total) by mouth 3 (three) times daily. 03/19/17  Yes Jeffery, Chelle, PA  azelastine (ASTELIN) 0.1 % nasal spray Place 2 sprays into both nostrils 2 (two) times daily. Use in each nostril as directed 07/24/16  Yes Jeffery, Chelle, PA  calcium carbonate 1250 MG capsule Take 1,000 mg by mouth daily.   Yes [provider]  cetirizine (ZYRTEC) 10 MG tablet Take 10 mg by mouth daily.   Yes [provider]  cholecalciferol (VITAMIN D) 1000 UNITS tablet Take 5,000 Units by mouth daily.   Yes [provider]  clonazePAM (KLONOPIN) 1 MG tablet Take 3 mg by mouth at bedtime. May take 1/2 to 1 tablet during the day as needed    Yes [provider]  cyclobenzaprine (FLEXERIL) 10 MG tablet Take 1 tablet (10 mg total) by mouth 3 (three) times daily as needed for muscle spasms. 08/12/17  Yes Shawnee Knapp, MD  divalproex (DEPAKOTE ER) 500 MG 24 hr tablet Take 1,500 mg by mouth at bedtime.    Yes [provider]  empagliflozin (JARDIANCE) 10  MG TABS tablet Take 10 mg by mouth daily.   Yes [provider]  fluticasone (FLONASE) 50 MCG/ACT nasal spray Place 2 sprays into both nostrils daily. 07/04/17  Yes Jeffery, Chelle, PA  gabapentin (NEURONTIN) 600 MG tablet Take 600 mg by mouth at bedtime.     Yes [provider]  Insulin Lispro (HUMALOG KWIKPEN) 200 UNIT/ML SOPN Inject into the skin.   Yes [provider]  lamoTRIgine (LAMICTAL) 100 MG tablet Take 200 mg by mouth every morning.    Yes [provider]  levothyroxine (SYNTHROID, LEVOTHROID) 75 MCG tablet Take 75 mcg by mouth daily before breakfast.   Yes [provider]  meclizine (ANTIVERT) 25 MG tablet Take 1 tablet (25 mg total) by mouth 3 (three) times daily as needed for dizziness. 08/05/17  Yes Shawnee Knapp, MD  Melatonin 10 MG  CAPS Take by mouth.   Yes [provider]  naproxen (NAPROSYN) 500 MG tablet Take 500 mg by mouth 2 (two) times daily as needed (for pain).    Yes [provider]  NEEDLE, DISP, 30 G 30G X 3/4" MISC Use as directed, daily blood sugars ( please dispense what patient had previously) 07/14/13  Yes Le, Thao P, DO  omeprazole (PRILOSEC) 40 MG capsule Take 1 capsule (40 mg total) by mouth daily. 02/08/14  Yes Jeffery, Chelle, PA  ondansetron (ZOFRAN-ODT) 8 MG disintegrating tablet Take 0.5-1 tablets (4-8 mg total) by mouth every 8 (eight) hours as needed for nausea or vomiting. 08/27/17  Yes Jaynee Eagles, PA-C  [facility] VERIO test strip  01/25/14  Yes [provider]  OXcarbazepine (TRILEPTAL) 150 MG tablet Take 150 mg by mouth at bedtime.    Yes [provider]  pravastatin (PRAVACHOL) 40 MG tablet Take 40 mg by mouth daily.   Yes [provider]  pregabalin (LYRICA) 75 MG capsule Take 75 mg by mouth every morning.    Yes [provider]  promethazine (PHENERGAN) 12.5 MG suppository Place 1 suppository (12.5 mg total) rectally every 6 (six) hours as needed for nausea or vomiting. 08/27/17  Yes Jaynee Eagles, PA-C  promethazine (PHENERGAN) 25 MG tablet Take 1 tablet (25 mg total) by mouth every 8 (eight) hours as needed for nausea or vomiting. 07/04/17  Yes Jacqulynn Cadet, Chelle, PA  rizatriptan (MAXALT-MLT) 10 MG disintegrating tablet Take 1 tablet (10 mg total) by mouth as needed for migraine. May repeat in 2 hours if needed x 2 additional doses. Max 3 tabs/24hrs 08/12/17  Yes Shawnee Knapp, MD  sertraline (ZOLOFT) 25 MG tablet Take 50 mg by mouth daily.   Yes [provider]  topiramate (TOPAMAX) 50 MG tablet Take 1 tablet (50 mg total) by mouth 2 (two) times daily. 03/27/16  Yes Jeffery, Domingo Mend, PA  TRESIBA FLEXTOUCH 200 UNIT/ML SOPN  07/02/17  Yes [provider]  TRULICITY 1.5 IR/4.8NI SOPN INJECT 1.5 MG WEEKLY FOR DIABETES 09/22/14  Yes [provider]  acetic acid-hydrocortisone (VOSOL-HC) OTIC solution Place 3 drops into the left ear 3 (three) times daily. Patient not taking: Reported on 11/14/2017 07/31/17   Horald Pollen, MD  eletriptan (RELPAX) 40 MG tablet Take 1 tablet (40 mg total) by mouth as needed for migraine. 1 tab PO at onset of headache Repeat in 2 hours if headache persists or recurs Patient not taking: Reported on 09/11/2017 07/04/17   Harrison Mons, PA  loperamide (IMODIUM) 2 MG capsule Take by mouth as needed for diarrhea or loose stools.  [provider]    Past Medical History:  Diagnosis Date  . Anxiety   . Bipolar disorder ([place])   . Closed fracture of proximal end of left humerus with routine healing, subsequent encounter 12/15/2015  . Depression   . Diabetes mellitus   . Fatty liver disease, nonalcoholic   . GERD (gastroesophageal reflux disease)   . High cholesterol   . Hypothyroidism     Past Surgical History:  Procedure Laterality Date  . DILATION AND CURETTAGE OF UTERUS    . ERCP  03/03/2011   Procedure: ENDOSCOPIC RETROGRADE CHOLANGIOPANCREATOGRAPHY (ERCP);  Surgeon: Lafayette Dragon, MD;  Location: [facility] ENDOSCOPY;  Service: Endoscopy;  Laterality: N/A;  . [place]  . TOE SURGERY Right 04/05/2017   right big toe    Social History   Tobacco Use  . Smoking status: Never Smoker  . Smokeless tobacco: Never Used  Substance Use Topics  . Alcohol use: Yes    Alcohol/week: 1.0 standard drinks    Types: 1 Standard drinks or equivalent per week    Comment: once every couple months    Family History  Problem Relation Age of Onset  . Mental illness Father        Manic Depression  . Alcohol abuse Father   . Stroke Father   . Heart disease Father   . Arthritis Son   . Depression Son   . Cancer Mother 81       peritoneal  . Breast cancer Maternal Grandmother   . Breast cancer Paternal Grandmother     ROS Per hpi  OBJECTIVE:  Blood  pressure 129/65, pulse 73, temperature (!) 97.4 F (36.3 C), temperature source Oral, resp. rate 16, height 4\' 11"  (1.499 m), weight 196 lb 3.2 oz (89 kg), SpO2 98 %. Body mass index is 39.63 kg/m.   Physical Exam  Constitutional: She is oriented to person, place, and time. She appears well-developed and well-nourished.  HENT:  Head: Normocephalic and atraumatic.  Right Ear: Hearing, tympanic membrane, external ear and ear canal normal.  Left Ear: Hearing, tympanic membrane, external ear and ear canal normal.  Mouth/Throat: Mucous membranes are normal. Posterior oropharyngeal erythema present. Tonsils are 2+ on the right. Tonsils are 2+ on the left. No tonsillar exudate.  Eyes: Pupils are equal, round, and reactive to light. Conjunctivae and EOM are normal.  Neck: Neck supple.  Cardiovascular: Normal rate, regular rhythm and normal heart sounds. Exam reveals no gallop and no friction rub.  No murmur heard. Pulmonary/Chest: Effort normal and breath sounds normal. She has no wheezes. She has no rales.  Musculoskeletal: She exhibits no edema.  Lymphadenopathy:    She has cervical adenopathy.  Neurological: She is alert and oriented to person, place, and time.  Skin: Skin is warm and dry.  Psychiatric: She has a normal mood and affect.  Nursing note and vitals reviewed.   Results for orders placed or performed in visit on 11/14/17 (from the past 24 hour(s))  POCT rapid strep A     Status: Normal   Collection Time: 11/14/17 12:28 PM  Result Value Ref Range   Rapid Strep A Screen Negative Negative    ASSESSMENT and PLAN  1. Sore throat Discussed supportive measures, new meds r/se/b and RTC precautions. Patient educational handout given. - POCT rapid strep A - negative Sent culture    Return if symptoms worsen or fail to improve.    Rutherford Guys, MD [facility]  [place], [place] [address] Ph.  [phone] Fax [phone]

## 2017-11-16 LAB — CULTURE, GROUP A STREP: Strep A Culture: NEGATIVE

## 2017-11-27 ENCOUNTER — Encounter: Payer: Self-pay | Admitting: Family Medicine

## 2017-12-10 ENCOUNTER — Ambulatory Visit: Payer: Self-pay

## 2017-12-10 NOTE — Telephone Encounter (Signed)
Pt called to report vertigo since yesterday. She says yesterday it was so bad that she had nausea and vomiting. She has has this in the past and treated her nausea with zofran and then when she vomited took phenergan. She slept and got up this am feeling fine. She is working and her vertigo has returned." Not as bad as yesterday" She states she is able to walk without assistance. She reports a mild HA and mild nausea.  She has recently been started on a new medication Imuran by the neurologist. Per protocol appointment established for tomorrow. Pt agrees to be seen at urgent care or ED if symptom become severe. Care advice read to patient. Patient verbalized understanding of all instructions  Reason for Disposition . Vomiting occurs with dizziness  Answer Assessment - Initial Assessment Questions 1. DESCRIPTION: "Describe your dizziness."     Room spinning 2. VERTIGO: "Do you feel like either you or the room is spinning or tilting?"      Spinning and tilting 3. LIGHTHEADED: "Do you feel lightheaded?" (e.g., somewhat faint, woozy, weak upon standing)     yesterday 4. SEVERITY: "How bad is it?"  "Can you walk?"   - MILD - Feels unsteady but walking normally.   - MODERATE - Feels very unsteady when walking, but not falling; interferes with normal activities (e.g., school, work) .   - SEVERE - Unable to walk without falling (requires assistance).     mild 5. ONSET:  "When did the dizziness begin?"     Yesterday with vomiting  6. AGGRAVATING FACTORS: "Does anything make it worse?" (e.g., standing, change in head position)     Head position 7. CAUSE: "What do you think is causing the dizziness?"     unsure 8. RECURRENT SYMPTOM: "Have you had dizziness before?" If so, ask: "When was the last time?" "What happened that time?"     Yes  Same symptoms recent a couple months ago 9. OTHER SYMPTOMS: "Do you have any other symptoms?" (e.g., headache, weakness, numbness, vomiting, earache)    mild HA   Yesterday vomiting 10. PREGNANCY: "Is there any chance you are pregnant?" "When was your last menstrual period?"       No menopause  Protocols used: DIZZINESS - VERTIGO-A-AH

## 2017-12-11 ENCOUNTER — Ambulatory Visit: Payer: Self-pay | Admitting: Physician Assistant

## 2017-12-25 ENCOUNTER — Ambulatory Visit: Payer: BC Managed Care – PPO | Admitting: Family Medicine

## 2017-12-25 ENCOUNTER — Other Ambulatory Visit: Payer: Self-pay

## 2017-12-25 ENCOUNTER — Encounter: Payer: Self-pay | Admitting: Family Medicine

## 2017-12-25 VITALS — BP 102/71 | HR 74 | Temp 97.6°F | Ht 59.0 in | Wt 193.0 lb

## 2017-12-25 DIAGNOSIS — G43109 Migraine with aura, not intractable, without status migrainosus: Secondary | ICD-10-CM | POA: Diagnosis not present

## 2017-12-25 DIAGNOSIS — M62838 Other muscle spasm: Secondary | ICD-10-CM

## 2017-12-25 DIAGNOSIS — M542 Cervicalgia: Secondary | ICD-10-CM

## 2017-12-25 MED ORDER — CYCLOBENZAPRINE HCL 5 MG PO TABS: 5.0000 mg | ORAL_TABLET | Freq: Three times a day (TID) | ORAL | 1 refills | Status: DC | PRN

## 2017-12-25 MED ORDER — KETOROLAC TROMETHAMINE 60 MG/2ML IM SOLN
60.0000 mg | Freq: Once | INTRAMUSCULAR | Status: AC
  Administered 2017-12-25: 60 mg via INTRAMUSCULAR

## 2017-12-25 NOTE — Patient Instructions (Addendum)
Toradol injection given, okay to use Zofran this afternoon if needed for nausea or vomiting.  I also refilled Flexeril as some of your symptoms may be due to neck discomfort/muscle spasm.  Please see primary care provider for further refills.  Return to the clinic or go to the nearest emergency room if any of your symptoms worsen or new symptoms occur.  Thank you for coming in today   Migraine Headache A migraine headache is an intense, throbbing pain on one side or both sides of the head. Migraines may also cause other symptoms, such as nausea, vomiting, and sensitivity to light and noise. What are the causes? Doing or taking certain things may also trigger migraines, such as:  Alcohol.  Smoking.  Medicines, such as: ? Medicine used to treat chest pain (nitroglycerine). ? Birth control pills. ? Estrogen pills. ? Certain blood pressure medicines.  Aged cheeses, chocolate, or caffeine.  Foods or drinks that contain nitrates, glutamate, aspartame, or tyramine.  Physical activity.  Other things that may trigger a migraine include:  Menstruation.  Pregnancy.  Hunger.  Stress, lack of sleep, too much sleep, or fatigue.  Weather changes.  What increases the risk? The following factors may make you more likely to experience migraine headaches:  Age. Risk increases with age.  Family history of migraine headaches.  Being Caucasian.  Depression and anxiety.  Obesity.  Being a woman.  Having a hole in the heart (patent foramen ovale) or other heart problems.  What are the signs or symptoms? The main symptom of this condition is pulsating or throbbing pain. Pain may:  Happen in any area of the head, such as on one side or both sides.  Interfere with daily activities.  Get worse with physical activity.  Get worse with exposure to bright lights or loud noises.  Other symptoms may include:  Nausea.  Vomiting.  Dizziness.  General sensitivity to bright  lights, loud noises, or smells.  Before you get a migraine, you may get warning signs that a migraine is developing (aura). An aura may include:  Seeing flashing lights or having blind spots.  Seeing bright spots, halos, or zigzag lines.  Having tunnel vision or blurred vision.  Having numbness or a tingling feeling.  Having trouble talking.  Having muscle weakness.  How is this diagnosed? A migraine headache can be diagnosed based on:  Your symptoms.  A physical exam.  Tests, such as CT scan or MRI of the head. These imaging tests can help rule out other causes of headaches.  Taking fluid from the spine (lumbar puncture) and analyzing it (cerebrospinal fluid analysis, or CSF analysis).  How is this treated? A migraine headache is usually treated with medicines that:  Relieve pain.  Relieve nausea.  Prevent migraines from coming back.  Treatment may also include:  Acupuncture.  Lifestyle changes like avoiding foods that trigger migraines.  Follow these instructions at home: Medicines  Take over-the-counter and prescription medicines only as told by your health care provider.  Do not drive or use heavy machinery while taking prescription pain medicine.  To prevent or treat constipation while you are taking prescription pain medicine, your health care provider may recommend that you: ? Drink enough fluid to keep your urine clear or pale yellow. ? Take over-the-counter or prescription medicines. ? Eat foods that are high in fiber, such as fresh fruits and vegetables, whole grains, and beans. ? Limit foods that are high in fat and processed sugars, such as fried  and sweet foods. Lifestyle  Avoid alcohol use.  Do not use any products that contain nicotine or tobacco, such as cigarettes and e-cigarettes. If you need help quitting, ask your health care provider.  Get at least 8 hours of sleep every night.  Limit your stress. General instructions   Keep a  journal to find out what may trigger your migraine headaches. For example, write down: ? What you eat and drink. ? How much sleep you get. ? Any change to your diet or medicines.  If you have a migraine: ? Avoid things that make your symptoms worse, such as bright lights. ? It may help to lie down in a dark, quiet room. ? Do not drive or use heavy machinery. ? Ask your health care provider what activities are safe for you while you are experiencing symptoms.  Keep all follow-up visits as told by your health care provider. This is important. Contact a health care provider if:  You develop symptoms that are different or more severe than your usual migraine symptoms. Get help right away if:  Your migraine becomes severe.  You have a fever.  You have a stiff neck.  You have vision loss.  Your muscles feel weak or like you cannot control them.  You start to lose your balance often.  You develop trouble walking.  You faint. This information is not intended to replace advice given to you by your health care provider. Make sure you discuss any questions you have with your health care provider. Document Released: 02/05/2005 Document Revised: 08/26/2015 Document Reviewed: 07/25/2015 Elsevier Interactive Patient Education  2017 [employer].   If you have lab work done today you will be contacted with your lab results within the next 2 weeks.  If you have not heard from [place] then please contact us. The fastest way to get your results is to register for My Chart.   IF you received an x-ray today, you will receive an invoice from [facility]. Please contact [facility] at [phone] with questions or concerns regarding your invoice.   IF you received labwork today, you will receive an invoice from [facility]. Please contact [facility] at [phone] with questions or concerns regarding your invoice.   Our billing staff will not be able to assist you with questions  regarding bills from these companies.  You will be contacted with the lab results as soon as they are available. The fastest way to get your results is to activate your My Chart account. Instructions are located on the last page of this paperwork. If you have not heard from [place] regarding the results in 2 weeks, please contact this office.

## 2017-12-25 NOTE — Progress Notes (Signed)
Subjective:  By signing my name below, I, Michelle Torres, attest that this documentation has been prepared under the direction and in the presence of Michelle Ray, MD. Electronically Signed: Moises Torres, [place]. 12/25/2017 , 8:29 AM .  Patient was seen in Room 10 .   Patient ID: Michelle Torres, female    DOB: 1961-01-05, 57 y.o.   MRN: [id number] Chief Complaint  Patient presents with  . Headache    currently have one   HPI Michelle Torres is a 57 y.o. female  Here for complaints of headache. Per chart review, she has a history of migraines, diabetes, bipolar, and occular myasthenia gravis. It appears she was evaluated on Oct 8th by [facility] neurology, Thomos Lemons, PA-C; recommended increase depakote 1000 mg qd with maxalt as needed. Plan for 2 month follow up. Also per chart review, she received Toradol which worked well for acute migraines.   Patient states she's currently having a headache (locates frontal headache), rating it on a scale of 7/10. She initially felt headache this morning at 5:30 AM. She has noticed a little aura, similar to previous migraines. She has also taken Zofran to help with her nausea this morning. Most of the time, her headaches resolve after a 2nd maxalt. 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 She has neck and arm pain, which she also requested refill of flexeril today. She denies cough, cold, congestion, rhinorrhea, slurred speech or weakness.   She's currently on Depakote 1000 mg; noting this has helped with migraines, as this is the first migraine since she saw Thomos Lemons and increased Depakote on Oct 8th.   Patient Active Problem List   Diagnosis Date Noted  . Otalgia, right ear 07/31/2017  . Sore throat 07/31/2017  . Acute pharyngitis 07/31/2017  . Ear infection 07/31/2017    . Nausea and vomiting 08/28/2016  . Elevated transaminase level 08/09/2016  . Mixed dyslipidemia 08/09/2016  . GERD (gastroesophageal reflux disease) 02/15/2014  . IBS (irritable bowel syndrome) 02/15/2014  . Osteopenia 02/08/2014  . Ocular myasthenia gravis ([place]) 10/07/2013  . Vitamin D deficiency 08/27/2013  . Primary hypothyroidism 08/27/2013  . Migraine 08/14/2013  . Bipolar 2 disorder ([place]) 03/05/2011  . Type 2 diabetes mellitus with hyperglycemia, with long-term current use of insulin ([place]) 03/05/2011  . Fatty infiltration of liver 03/02/2011   Past Medical History:  Diagnosis Date  . Anxiety   . Bipolar disorder ([place])   . Closed fracture of proximal end of left humerus with routine healing, subsequent encounter 12/15/2015  . Depression   . Diabetes mellitus   . Fatty liver disease, nonalcoholic   . GERD (gastroesophageal reflux disease)   . High cholesterol   . Hypothyroidism    Past Surgical History:  Procedure Laterality Date  . DILATION AND CURETTAGE OF UTERUS    . ERCP  03/03/2011   Procedure: ENDOSCOPIC RETROGRADE CHOLANGIOPANCREATOGRAPHY (ERCP);  Surgeon: Lafayette Dragon, MD;  Location: [facility] ENDOSCOPY;  Service: Endoscopy;  Laterality: N/A;  . [place]  . TOE SURGERY Right 04/05/2017   right big toe   Allergies  Allergen Reactions  . Pyridostigmine Bromide Nausea And Vomiting    "violently ill" "violently ill"  . Risperidone Other (See Comments)    liver damage "enlarged my liver"  . Metformin And Related  Severe GI distress  . Risperidone And Related Other (See Comments)    liver damage   Prior to Admission medications   Medication Sig Start Date End Date Taking? Authorizing Provider  alosetron (LOTRONEX) 1 MG tablet Take 0.5 tablets (0.5 mg total) by mouth 2 (two) times daily. 03/19/17  Yes Jeffery, Domingo Mend, PA  azaTHIOprine (IMURAN) 50 MG tablet Take 1 tablet (50 mg total) by mouth 3 (three) times daily. 03/19/17  Yes Jeffery,  Chelle, PA  azelastine (ASTELIN) 0.1 % nasal spray Place 2 sprays into both nostrils 2 (two) times daily. Use in each nostril as directed 07/24/16  Yes Jeffery, Chelle, PA  calcium carbonate 1250 MG capsule Take 1,000 mg by mouth daily.   Yes [provider]  cetirizine (ZYRTEC) 10 MG tablet Take 10 mg by mouth daily.   Yes [provider]  cholecalciferol (VITAMIN D) 1000 UNITS tablet Take 5,000 Units by mouth daily.   Yes [provider]  clonazePAM (KLONOPIN) 1 MG tablet Take 3 mg by mouth at bedtime. May take 1/2 to 1 tablet during the day as needed    Yes [provider]  cyclobenzaprine (FLEXERIL) 10 MG tablet Take 1 tablet (10 mg total) by mouth 3 (three) times daily as needed for muscle spasms. 08/12/17  Yes Shawnee Knapp, MD  divalproex (DEPAKOTE ER) 500 MG 24 hr tablet Take 1,500 mg by mouth at bedtime.    Yes [provider]  empagliflozin (JARDIANCE) 10 MG TABS tablet Take 10 mg by mouth daily.   Yes [provider]  fluticasone (FLONASE) 50 MCG/ACT nasal spray Place 2 sprays into both nostrils daily. 07/04/17  Yes Jeffery, Chelle, PA  gabapentin (NEURONTIN) 600 MG tablet Take 600 mg by mouth at bedtime.     Yes [provider]  Insulin Lispro (HUMALOG KWIKPEN) 200 UNIT/ML SOPN Inject into the skin.   Yes [provider]  lamoTRIgine (LAMICTAL) 100 MG tablet Take 200 mg by mouth every morning.    Yes [provider]  levothyroxine (SYNTHROID, LEVOTHROID) 75 MCG tablet Take 75 mcg by mouth daily before breakfast.   Yes [provider]  meclizine (ANTIVERT) 25 MG tablet Take 1 tablet (25 mg total) by mouth 3 (three) times daily as needed for dizziness. 08/05/17  Yes Shawnee Knapp, MD  Melatonin 10 MG CAPS Take by mouth.   Yes [provider]  naproxen (NAPROSYN) 500 MG tablet Take 500 mg by mouth 2 (two) times daily as needed (for pain).    Yes [provider]  NEEDLE, DISP, 30 G 30G X 3/4"  MISC Use as directed, daily Torres sugars ( please dispense what patient had previously) 07/14/13  Yes Le, Thao P, DO  omeprazole (PRILOSEC) 40 MG capsule Take 1 capsule (40 mg total) by mouth daily. 02/08/14  Yes Jeffery, Chelle, PA  ondansetron (ZOFRAN-ODT) 8 MG disintegrating tablet Take 0.5-1 tablets (4-8 mg total) by mouth every 8 (eight) hours as needed for nausea or vomiting. 08/27/17  Yes Jaynee Eagles, PA-C  [facility] VERIO test strip  01/25/14  Yes [provider]  OXcarbazepine (TRILEPTAL) 150 MG tablet Take 150 mg by mouth at bedtime.    Yes [provider]  pravastatin (PRAVACHOL) 40 MG tablet Take 40 mg by mouth daily.   Yes [provider]  pregabalin (LYRICA) 75 MG capsule Take 75 mg by mouth every morning.    Yes [provider]  promethazine (PHENERGAN) 12.5 MG suppository Place 1 suppository (12.5  mg total) rectally every 6 (six) hours as needed for nausea or vomiting. 08/27/17  Yes Jaynee Eagles, PA-C  promethazine (PHENERGAN) 25 MG tablet Take 1 tablet (25 mg total) by mouth every 8 (eight) hours as needed for nausea or vomiting. 07/04/17  Yes Jacqulynn Cadet, Chelle, PA  rizatriptan (MAXALT-MLT) 10 MG disintegrating tablet Take 1 tablet (10 mg total) by mouth as needed for migraine. May repeat in 2 hours if needed x 2 additional doses. Max 3 tabs/24hrs 08/12/17  Yes Shawnee Knapp, MD  sertraline (ZOLOFT) 25 MG tablet Take 50 mg by mouth daily.   Yes [provider]  topiramate (TOPAMAX) 50 MG tablet Take 1 tablet (50 mg total) by mouth 2 (two) times daily. 03/27/16  Yes Jeffery, Domingo Mend, PA  TRESIBA FLEXTOUCH 200 UNIT/ML SOPN  07/02/17  Yes [provider]  TRULICITY 1.5 OI/7.1IW SOPN INJECT 1.5 MG WEEKLY FOR DIABETES 09/22/14  Yes [provider]   Social History   Socioeconomic History  . Marital status: Married    Spouse name: [name] Bogucki  . Number of children: 2  . Years of education: 68  . Highest education level: Not on file    Occupational History  . Occupation: PROCESSING ASSISTANT    Employer: [employer]  Social Needs  . Financial resource strain: Not on file  . Food insecurity:    Worry: Not on file    Inability: Not on file  . Transportation needs:    Medical: Not on file    Non-medical: Not on file  Tobacco Use  . Smoking status: Never Smoker  . Smokeless tobacco: Never Used  Substance and Sexual Activity  . Alcohol use: Yes    Alcohol/week: 1.0 standard drinks    Types: 1 Standard drinks or equivalent per week    Comment: once every couple months  . Drug use: No  . Sexual activity: Yes    Partners: Male    Birth control/protection: Post-menopausal  Lifestyle  . Physical activity:    Days per week: Not on file    Minutes per session: Not on file  . Stress: Not on file  Relationships  . Social connections:    Talks on phone: Not on file    Gets together: Not on file    Attends religious service: Not on file    Active member of club or organization: Not on file    Attends meetings of clubs or organizations: Not on file    Relationship status: Not on file  . Intimate partner violence:    Fear of current or ex partner: Not on file    Emotionally abused: Not on file    Physically abused: Not on file    Forced sexual activity: Not on file  Other Topics Concern  . Not on file  Social History Narrative   Lives with her husband.  Their daughter, lives with them intermittently.  Her son is at [facility] and lives with them. Her husband has a child from a previous relationship who lives in [place].   Review of Systems  Constitutional: Negative for chills, fatigue, fever and unexpected weight change.  Respiratory: Negative for cough.   Gastrointestinal: Negative for constipation, diarrhea, nausea and vomiting.  Musculoskeletal: Positive for myalgias and neck pain.  Skin: Negative for rash and wound.  Neurological: Positive for headaches. Negative for dizziness, weakness and numbness.        Objective:   Physical Exam  Constitutional: She is oriented to person, place, and  time. She appears well-developed and well-nourished. No distress.  HENT:  Head: Normocephalic and atraumatic.  Eyes: Pupils are equal, round, and reactive to light. EOM are normal. Right eye exhibits no nystagmus. Left eye exhibits no nystagmus.  Neck: Neck supple.  Cardiovascular: Normal rate.  Pulmonary/Chest: Effort normal. No respiratory distress.  Musculoskeletal: Normal range of motion.  Neck: supple neck full ROM, no midline bony tenderness, slight tenderness right paraspinal into the trapezius posterior shoulder, minimal AC tenderness, slight discomfort with crossover but full ROM  Neurological: She is alert and oriented to person, place, and time. She has normal strength. She displays a negative Romberg sign.  No pronator drift, equal strength, no focal weakness  Skin: Skin is warm and dry.  no dysesthesia of skin  Psychiatric: She has a normal mood and affect. Her behavior is normal.  Nursing note and vitals reviewed.   Vitals:   12/25/17 0800  BP: 102/71  Pulse: 74  Temp: 97.6 F (36.4 C)  TempSrc: Oral  SpO2: 94%  Weight: 193 lb (87.5 kg)  Height: 4\' 11"  (1.499 m)       Assessment & Plan:    Rhodia Acres is a 57 y.o. female Migraine with aura and without status migrainosus, not intractable - Plan: ketorolac (TORADOL) injection 60 mg  Neck pain - Unclear etiology, may have triggered her migraine.  Cyclobenzaprine.  Anticipatory guidance provided. - Plan: cyclobenzaprine (FLEXERIL) 5 MG tablet  Muscle spasms of neck  Typical migraine symptoms, some improvement since initial onset.  May have component of tension headache with muscle spasms of the neck as well.  Has had some improvement with Flexeril in the past.  Nonfocal neurologic exam.  -Toradol 60 mg IM, has Zofran at home if needed.  Flexeril provided, can try 5 mg dose as may be just as effective with less sedation.  Note  for work today, RTC/ER precautions if not improving/worsening.  Meds ordered this encounter  Medications  . cyclobenzaprine (FLEXERIL) 5 MG tablet    Sig: Take 1-2 tablets (5-10 mg total) by mouth 3 (three) times daily as needed for muscle spasms.    Dispense:  30 tablet    Refill:  1  . ketorolac (TORADOL) injection 60 mg   Patient Instructions     Toradol injection given, okay to use Zofran this afternoon if needed for nausea or vomiting.  I also refilled Flexeril as some of your symptoms may be due to neck discomfort/muscle spasm.  Please see primary care provider for further refills.  Return to the clinic or go to the nearest emergency room if any of your symptoms worsen or new symptoms occur.  Thank you for coming in today   Migraine Headache A migraine headache is an intense, throbbing pain on one side or both sides of the head. Migraines may also cause other symptoms, such as nausea, vomiting, and sensitivity to light and noise. What are the causes? Doing or taking certain things may also trigger migraines, such as:  Alcohol.  Smoking.  Medicines, such as: ? Medicine used to treat chest pain (nitroglycerine). ? Birth control pills. ? Estrogen pills. ? Certain Torres pressure medicines.  Aged cheeses, chocolate, or caffeine.  Foods or drinks that contain nitrates, glutamate, aspartame, or tyramine.  Physical activity.  Other things that may trigger a migraine include:  Menstruation.  Pregnancy.  Hunger.  Stress, lack of sleep, too much sleep, or fatigue.  Weather changes.  What increases the risk? The following factors may make you  more likely to experience migraine headaches:  Age. Risk increases with age.  Family history of migraine headaches.  Being Caucasian.  Depression and anxiety.  Obesity.  Being a woman.  Having a hole in the heart (patent foramen ovale) or other heart problems.  What are the signs or symptoms? The main symptom of  this condition is pulsating or throbbing pain. Pain may:  Happen in any area of the head, such as on one side or both sides.  Interfere with daily activities.  Get worse with physical activity.  Get worse with exposure to bright lights or loud noises.  Other symptoms may include:  Nausea.  Vomiting.  Dizziness.  General sensitivity to bright lights, loud noises, or smells.  Before you get a migraine, you may get warning signs that a migraine is developing (aura). An aura may include:  Seeing flashing lights or having blind spots.  Seeing bright spots, halos, or zigzag lines.  Having tunnel vision or blurred vision.  Having numbness or a tingling feeling.  Having trouble talking.  Having muscle weakness.  How is this diagnosed? A migraine headache can be diagnosed based on:  Your symptoms.  A physical exam.  Tests, such as CT scan or MRI of the head. These imaging tests can help rule out other causes of headaches.  Taking fluid from the spine (lumbar puncture) and analyzing it (cerebrospinal fluid analysis, or CSF analysis).  How is this treated? A migraine headache is usually treated with medicines that:  Relieve pain.  Relieve nausea.  Prevent migraines from coming back.  Treatment may also include:  Acupuncture.  Lifestyle changes like avoiding foods that trigger migraines.  Follow these instructions at home: Medicines  Take over-the-counter and prescription medicines only as told by your health care provider.  Do not drive or use heavy machinery while taking prescription pain medicine.  To prevent or treat constipation while you are taking prescription pain medicine, your health care provider may recommend that you: ? Drink enough fluid to keep your urine clear or pale yellow. ? Take over-the-counter or prescription medicines. ? Eat foods that are high in fiber, such as fresh fruits and vegetables, whole grains, and beans. ? Limit foods that  are high in fat and processed sugars, such as fried and sweet foods. Lifestyle  Avoid alcohol use.  Do not use any products that contain nicotine or tobacco, such as cigarettes and e-cigarettes. If you need help quitting, ask your health care provider.  Get at least 8 hours of sleep every night.  Limit your stress. General instructions   Keep a journal to find out what may trigger your migraine headaches. For example, write down: ? What you eat and drink. ? How much sleep you get. ? Any change to your diet or medicines.  If you have a migraine: ? Avoid things that make your symptoms worse, such as bright lights. ? It may help to lie down in a dark, quiet room. ? Do not drive or use heavy machinery. ? Ask your health care provider what activities are safe for you while you are experiencing symptoms.  Keep all follow-up visits as told by your health care provider. This is important. Contact a health care provider if:  You develop symptoms that are different or more severe than your usual migraine symptoms. Get help right away if:  Your migraine becomes severe.  You have a fever.  You have a stiff neck.  You have vision loss.  Your muscles feel  weak or like you cannot control them.  You start to lose your balance often.  You develop trouble walking.  You faint. This information is not intended to replace advice given to you by your health care provider. Make sure you discuss any questions you have with your health care provider. Document Released: 02/05/2005 Document Revised: 08/26/2015 Document Reviewed: 07/25/2015 Elsevier Interactive Patient Education  2017 [employer].   If you have lab work done today you will be contacted with your lab results within the next 2 weeks.  If you have not heard from [place] then please contact us. The fastest way to get your results is to register for My Chart.   IF you received an x-Torres today, you will receive an invoice from  [facility]. Please contact [facility] at [phone] with questions or concerns regarding your invoice.   IF you received labwork today, you will receive an invoice from [facility]. Please contact [facility] at [phone] with questions or concerns regarding your invoice.   Our billing staff will not be able to assist you with questions regarding bills from these companies.  You will be contacted with the lab results as soon as they are available. The fastest way to get your results is to activate your My Chart account. Instructions are located on the last page of this paperwork. If you have not heard from [place] regarding the results in 2 weeks, please contact this office.       I personally performed the services described in this documentation, which was scribed in my presence. The recorded information has been reviewed and considered for accuracy and completeness, addended by me as needed, and agree with information above.  Signed,   Michelle Ray, MD [facility] [place].  12/25/17 8:32 AM

## 2017-12-31 ENCOUNTER — Ambulatory Visit: Payer: BC Managed Care – PPO | Admitting: Family Medicine

## 2017-12-31 ENCOUNTER — Encounter: Payer: Self-pay | Admitting: Family Medicine

## 2017-12-31 ENCOUNTER — Other Ambulatory Visit: Payer: Self-pay

## 2017-12-31 VITALS — BP 94/63 | HR 84 | Temp 98.9°F | Resp 17 | Ht 59.0 in | Wt 192.2 lb

## 2017-12-31 DIAGNOSIS — R112 Nausea with vomiting, unspecified: Secondary | ICD-10-CM

## 2017-12-31 DIAGNOSIS — R1013 Epigastric pain: Secondary | ICD-10-CM | POA: Diagnosis not present

## 2017-12-31 DIAGNOSIS — K219 Gastro-esophageal reflux disease without esophagitis: Secondary | ICD-10-CM | POA: Diagnosis not present

## 2017-12-31 LAB — POCT URINALYSIS DIP (MANUAL ENTRY)
Bilirubin, UA: NEGATIVE
Blood, UA: NEGATIVE
Glucose, UA: 250 mg/dL — AB
Ketones, POC UA: NEGATIVE mg/dL
Leukocytes, UA: NEGATIVE
NITRITE UA: NEGATIVE
PROTEIN UA: NEGATIVE mg/dL
SPEC GRAV UA: 1.015 (ref 1.010–1.025)
UROBILINOGEN UA: 2 U/dL — AB
pH, UA: 8.5 — AB (ref 5.0–8.0)

## 2017-12-31 NOTE — Patient Instructions (Signed)
° ° ° °  If you have lab work done today you will be contacted with your lab results within the next 2 weeks.  If you have not heard from us then please contact us. The fastest way to get your results is to register for My Chart. ° ° °IF you received an x-ray today, you will receive an invoice from [facility]. Please contact [facility] at [phone] with questions or concerns regarding your invoice.  ° °IF you received labwork today, you will receive an invoice from [facility]. Please contact [facility] at [phone] with questions or concerns regarding your invoice.  ° °Our billing staff will not be able to assist you with questions regarding bills from these companies. ° °You will be contacted with the lab results as soon as they are available. The fastest way to get your results is to activate your My Chart account. Instructions are located on the last page of this paperwork. If you have not heard from us regarding the results in 2 weeks, please contact this office. °  ° ° ° °

## 2017-12-31 NOTE — Progress Notes (Signed)
Chief Complaint  Patient presents with  . emesis/gerd    emesis/gerd onset: 10:30 last night, bloating in abdomen traveled to esophagus and chest and excruciating pain.  Took zofran and prilosec with some nausea, tried sleeping upright and heating pad on belly and chest. 2nd prilosec/zofran at 5:30 and 1:30.  8:30 am this morning final vomitted.  Feel somewhat better and did call gastro all of sxs and told to see primary for gerd up into chest.  pt will need work note for today.    HPI  Patient presents with symptoms that started overnight She reports that she had bloating in the abdomen that goes up to her chest that causes excruciating pain States that she tried prilosec and zofran She felt nauseous It felt like GERD but she was concerned because it was in her chest She developed pain in her belly and finally after her second dose of prilosec and zofran she vomited She stated to feel better after vomiting Emesis was nonbloody and there was no bile She has had a similar episode She called her gastroenterologist and the gastroenterologist told her to contact her PCP So she made an appt  Past Medical History:  Diagnosis Date  . Anxiety   . Bipolar disorder ([place])   . Closed fracture of proximal end of left humerus with routine healing, subsequent encounter 12/15/2015  . Depression   . Diabetes mellitus   . Fatty liver disease, nonalcoholic   . GERD (gastroesophageal reflux disease)   . High cholesterol   . Hypothyroidism     Current Outpatient Medications  Medication Sig Dispense Refill  . alosetron (LOTRONEX) 1 MG tablet Take 0.5 tablets (0.5 mg total) by mouth 2 (two) times daily. 90 tablet 1  . azaTHIOprine (IMURAN) 50 MG tablet Take 1 tablet (50 mg total) by mouth 3 (three) times daily. 270 tablet 3  . azelastine (ASTELIN) 0.1 % nasal spray Place 2 sprays into both nostrils 2 (two) times daily. Use in each nostril as directed 30 mL 0  . calcium carbonate 1250 MG capsule Take  1,000 mg by mouth daily.    . cetirizine (ZYRTEC) 10 MG tablet Take 10 mg by mouth daily.    . cholecalciferol (VITAMIN D) 1000 UNITS tablet Take 5,000 Units by mouth daily.    . clonazePAM (KLONOPIN) 1 MG tablet Take 3 mg by mouth at bedtime. May take 1/2 to 1 tablet during the day as needed     . cyclobenzaprine (FLEXERIL) 5 MG tablet Take 1-2 tablets (5-10 mg total) by mouth 3 (three) times daily as needed for muscle spasms. 30 tablet 1  . divalproex (DEPAKOTE ER) 500 MG 24 hr tablet Take 1,500 mg by mouth at bedtime.     . empagliflozin (JARDIANCE) 10 MG TABS tablet Take 10 mg by mouth daily.    . fluticasone (FLONASE) 50 MCG/ACT nasal spray Place 2 sprays into both nostrils daily. 48 g 3  . gabapentin (NEURONTIN) 600 MG tablet Take 600 mg by mouth at bedtime.      . Insulin Lispro (HUMALOG KWIKPEN) 200 UNIT/ML SOPN Inject into the skin.    Marland Kitchen lamoTRIgine (LAMICTAL) 100 MG tablet Take 200 mg by mouth every morning.     Marland Kitchen levothyroxine (SYNTHROID, LEVOTHROID) 75 MCG tablet Take 75 mcg by mouth daily before breakfast.    . meclizine (ANTIVERT) 25 MG tablet Take 1 tablet (25 mg total) by mouth 3 (three) times daily as needed for dizziness. 30 tablet 0  . Melatonin  10 MG CAPS Take by mouth.    . naproxen (NAPROSYN) 500 MG tablet Take 500 mg by mouth 2 (two) times daily as needed (for pain).     Marland Kitchen NEEDLE, DISP, 30 G 30G X 3/4" MISC Use as directed, daily blood sugars ( please dispense what patient had previously) 100 each 3  . omeprazole (PRILOSEC) 40 MG capsule Take 1 capsule (40 mg total) by mouth daily. 30 capsule 3  . ondansetron (ZOFRAN-ODT) 8 MG disintegrating tablet Take 0.5-1 tablets (4-8 mg total) by mouth every 8 (eight) hours as needed for nausea or vomiting. 30 tablet 1  . ONETOUCH VERIO test strip   12  . OXcarbazepine (TRILEPTAL) 150 MG tablet Take 150 mg by mouth at bedtime.     . pravastatin (PRAVACHOL) 40 MG tablet Take 40 mg by mouth daily.    . pregabalin (LYRICA) 75 MG capsule  Take 75 mg by mouth every morning.     . promethazine (PHENERGAN) 12.5 MG suppository Place 1 suppository (12.5 mg total) rectally every 6 (six) hours as needed for nausea or vomiting. 10 suppository 1  . promethazine (PHENERGAN) 25 MG tablet Take 1 tablet (25 mg total) by mouth every 8 (eight) hours as needed for nausea or vomiting. 20 tablet 0  . rizatriptan (MAXALT-MLT) 10 MG disintegrating tablet Take 1 tablet (10 mg total) by mouth as needed for migraine. May repeat in 2 hours if needed x 2 additional doses. Max 3 tabs/24hrs 10 tablet 5  . sertraline (ZOLOFT) 25 MG tablet Take 50 mg by mouth daily.    Marland Kitchen topiramate (TOPAMAX) 50 MG tablet Take 1 tablet (50 mg total) by mouth 2 (two) times daily. 180 tablet 3  . TRESIBA FLEXTOUCH 200 UNIT/ML SOPN     . TRULICITY 1.5 IW/5.8KD SOPN INJECT 1.5 MG WEEKLY FOR DIABETES  11   No current facility-administered medications for this visit.     Allergies:  Allergies  Allergen Reactions  . Pyridostigmine Bromide Nausea And Vomiting    "violently ill" "violently ill"  . Risperidone Other (See Comments)    liver damage "enlarged my liver"  . Metformin And Related     Severe GI distress  . Risperidone And Related Other (See Comments)    liver damage    Past Surgical History:  Procedure Laterality Date  . DILATION AND CURETTAGE OF UTERUS    . ERCP  03/03/2011   Procedure: ENDOSCOPIC RETROGRADE CHOLANGIOPANCREATOGRAPHY (ERCP);  Surgeon: Lafayette Dragon, MD;  Location: [facility] ENDOSCOPY;  Service: Endoscopy;  Laterality: N/A;  . [place]  . TOE SURGERY Right 04/05/2017   right big toe    Social History   Socioeconomic History  . Marital status: Married    Spouse name: [name] Mickler  . Number of children: 2  . Years of education: 55  . Highest education level: Not on file  Occupational History  . Occupation: PROCESSING ASSISTANT    Employer: [employer]  Social Needs  . Financial resource strain: Not on file   . Food insecurity:    Worry: Not on file    Inability: Not on file  . Transportation needs:    Medical: Not on file    Non-medical: Not on file  Tobacco Use  . Smoking status: Never Smoker  . Smokeless tobacco: Never Used  Substance and Sexual Activity  . Alcohol use: Yes    Alcohol/week: 1.0 standard drinks    Types: 1 Standard drinks or equivalent per week  Comment: once every couple months  . Drug use: No  . Sexual activity: Yes    Partners: Male    Birth control/protection: Post-menopausal  Lifestyle  . Physical activity:    Days per week: Not on file    Minutes per session: Not on file  . Stress: Not on file  Relationships  . Social connections:    Talks on phone: Not on file    Gets together: Not on file    Attends religious service: Not on file    Active member of club or organization: Not on file    Attends meetings of clubs or organizations: Not on file    Relationship status: Not on file  Other Topics Concern  . Not on file  Social History Narrative   Lives with her husband.  Their daughter, lives with them intermittently.  Her son is at [facility] and lives with them. Her husband has a child from a previous relationship who lives in [place].    Family History  Problem Relation Age of Onset  . Mental illness Father        Manic Depression  . Alcohol abuse Father   . Stroke Father   . Heart disease Father   . Arthritis Son   . Depression Son   . Cancer Mother 22       peritoneal  . Breast cancer Maternal Grandmother   . Breast cancer Paternal Grandmother      ROS Review of Systems See HPI Constitution: No fevers or chills No malaise No diaphoresis Skin: No rash or itching Eyes: no blurry vision, no double vision GU: no dysuria or hematuria Neuro: no dizziness or headaches * all others reviewed and negative   Objective: Vitals:   12/31/17 1453  BP: 94/63  Pulse: 84  Resp: 17  Temp: 98.9 F (37.2 C)  TempSrc: Oral  SpO2: 93%  Weight: 192 lb  3.2 oz (87.2 kg)  Height: 4\' 11"  (1.499 m)    Physical Exam Physical Exam  Constitutional: He is oriented to person, place, and time. He appears well-developed and well-nourished.  HENT:  Head: Normocephalic and atraumatic.  Eyes: Conjunctivae and EOM are normal.  Cardiovascular: Normal rate, regular rhythm, normal heart sounds and intact distal pulses.  No murmur heard. Pulmonary/Chest: Effort normal and breath sounds normal. No stridor. No respiratory distress. He has no wheezes.  Abdomen: nondistended, normoactive bowel sounds, mild tenderness in the epigastrium, no rebound, no guarding, mcburney;s neg, murphy's absent Neurological: He is alert and oriented to person, place, and time.  Skin: Skin is warm. Capillary refill takes less than 2 seconds.  Psychiatric: He has a normal mood and affect. His behavior is normal. Judgment and thought content normal.   EKG- independently reviewed Normal sinus rhythm No st elevation  No LBBB  Assessment and Plan Ashten was seen today for emesis/gerd.  Diagnoses and all orders for this visit:  Abdominal pain, epigastric-  Continue PPI and follow up with Gastroenterology The fact that ECG is normal and symptoms are improving is reassuring  Non-intractable vomiting with nausea, unspecified vomiting type- dehydration noted but no UTI Continue hydration -     POCT urinalysis dipstick -     EKG 12-Lead  Gastroesophageal reflux disease, esophagitis presence not specified- continue with GI This is likely aggravation of preexisting GERD Discussed avoidance of triggers  A total of 30 minutes were spent face-to-face with the patient during this encounter and over half of that time was spent on counseling  and coordination of care.    [place]

## 2019-08-05 IMAGING — DX DG HIP (WITH OR WITHOUT PELVIS) 2-3V*R*
3 series · 3 of 3 positions shown · non-contrast
Comparison: None.

CLINICAL DATA: Patient fell.  Right hip pain

EXAM:
DG HIP (WITH OR WITHOUT PELVIS) 2-3V RIGHT

[pelvis ap]
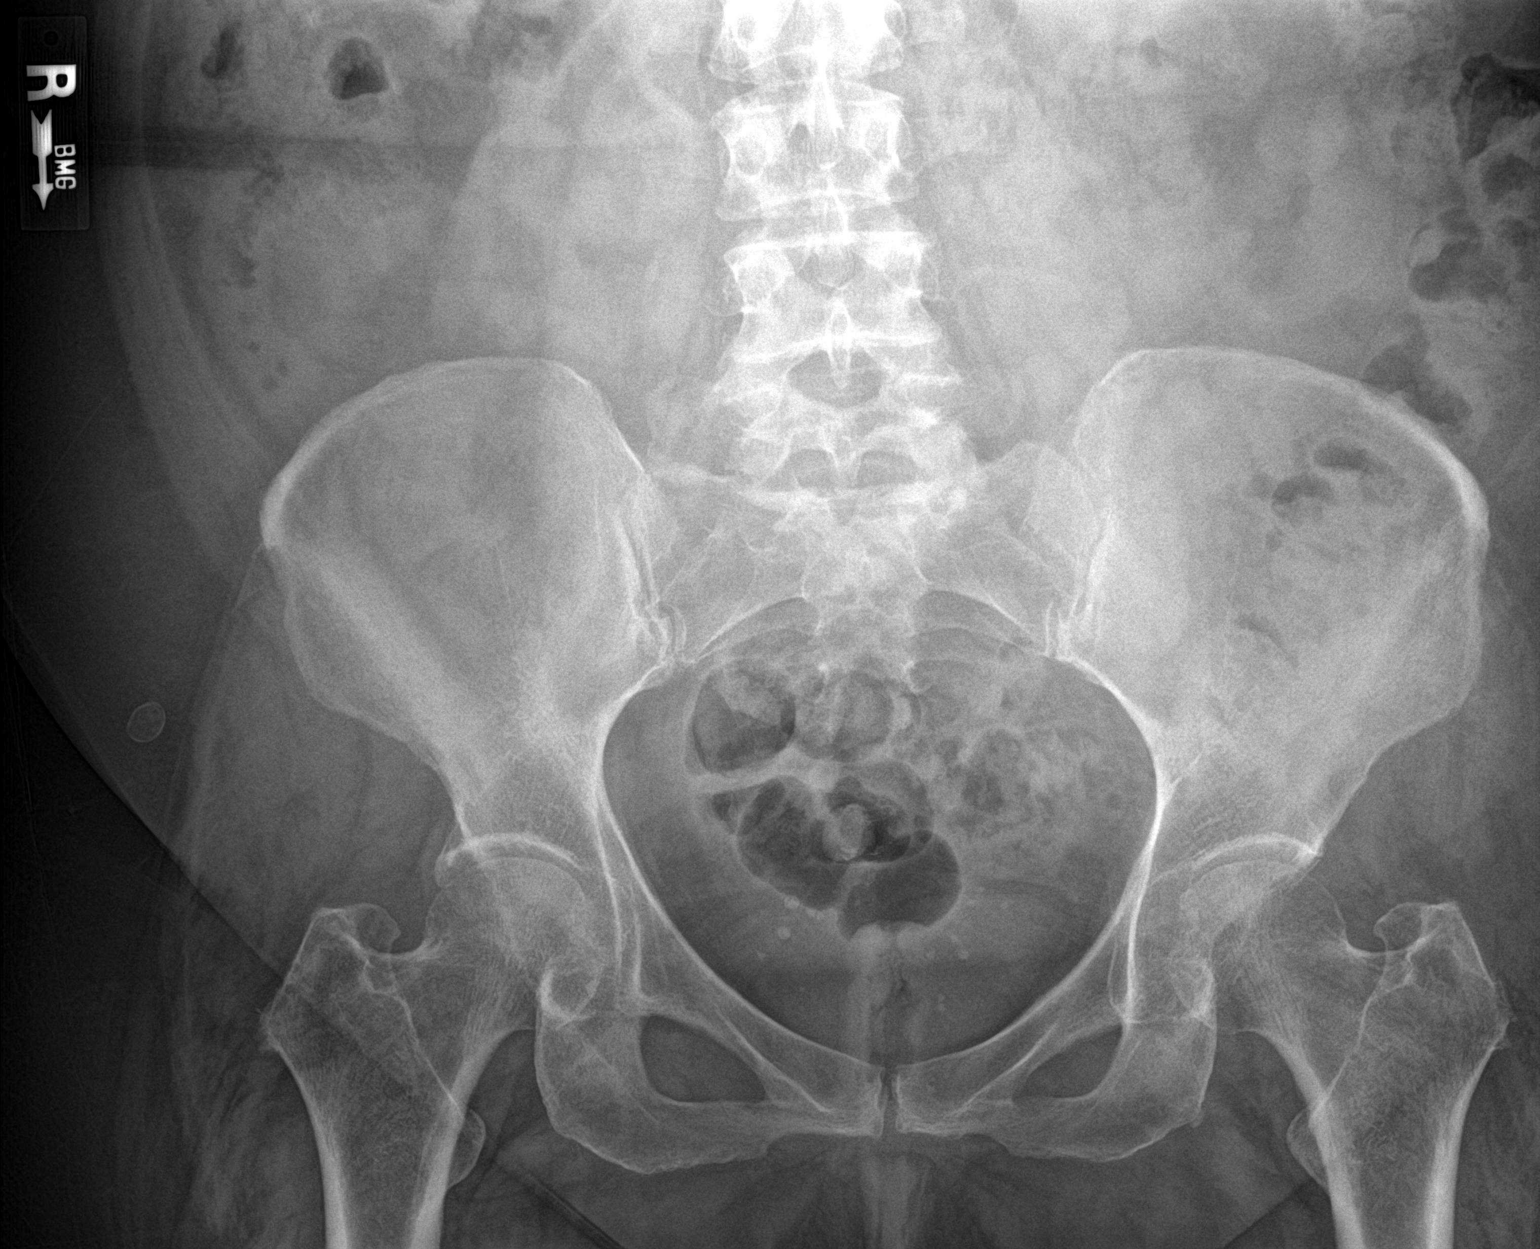

[hip ap]
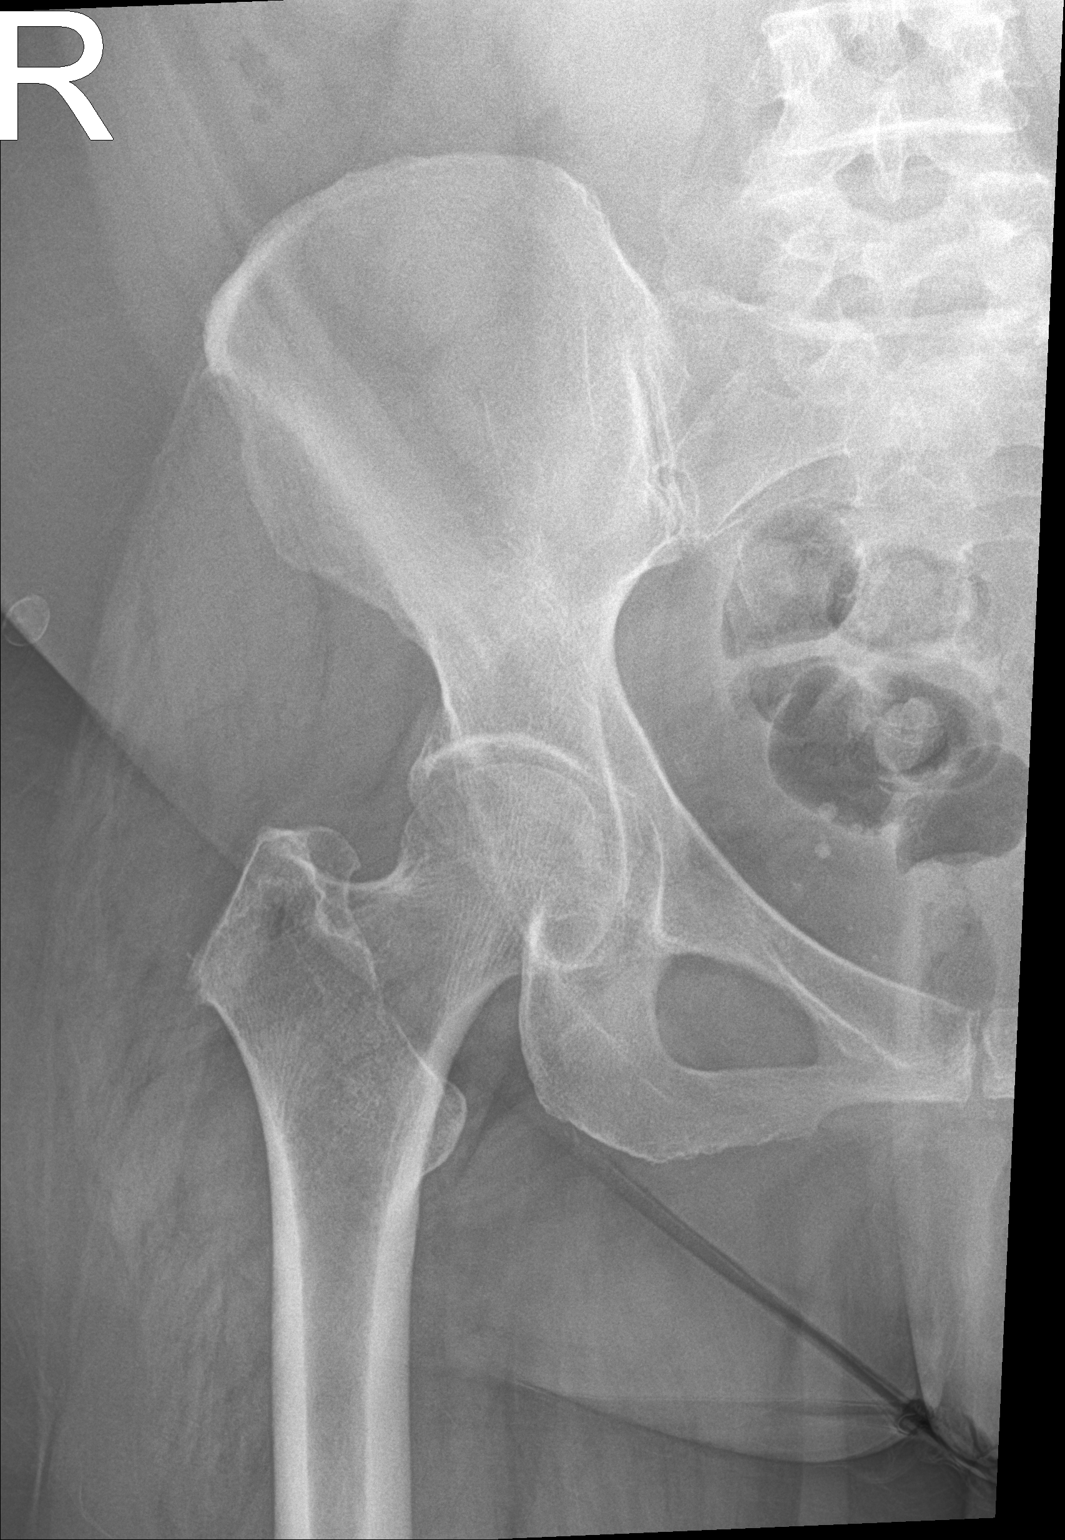

[hip lat]
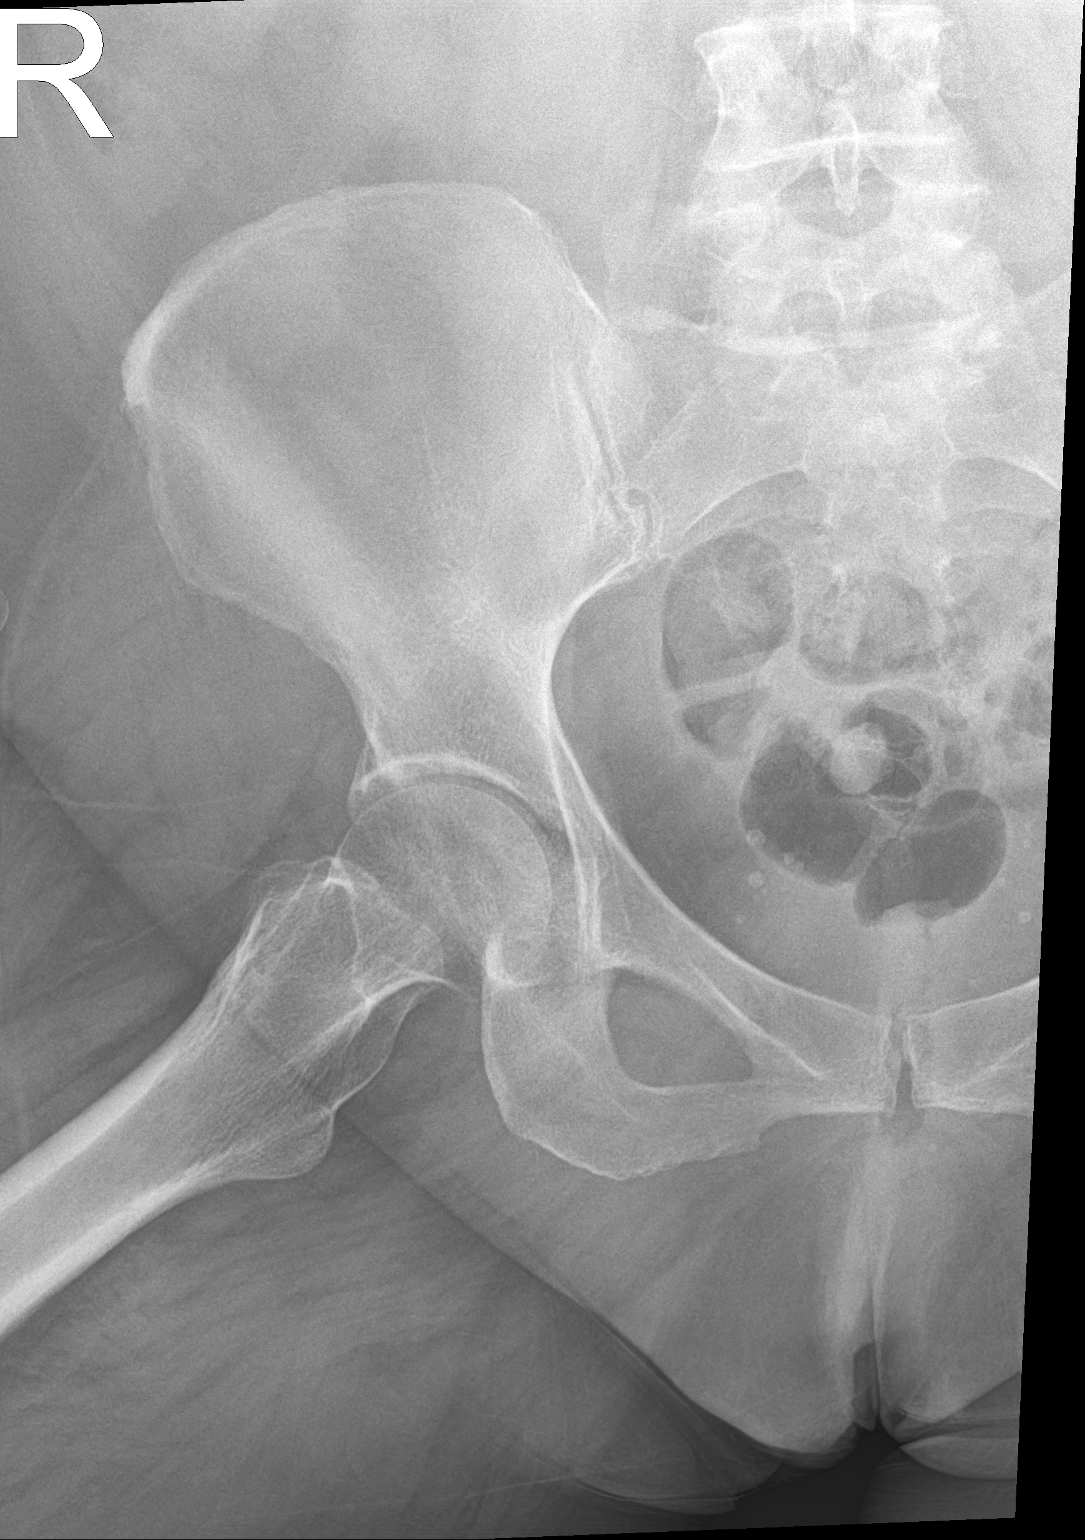

[3 of 3 positions shown; findings below may reference images not displayed]

FINDINGS: There is no evidence of hip fracture or dislocation. There is no
evidence of arthropathy or other focal bone abnormality.
IMPRESSION: Negative.

## 2019-12-05 ENCOUNTER — Ambulatory Visit: Payer: BC Managed Care – PPO

## 2021-04-25 ENCOUNTER — Ambulatory Visit: Payer: BC Managed Care – PPO | Admitting: Physician Assistant

## 2021-04-25 ENCOUNTER — Encounter: Payer: Self-pay | Admitting: Physician Assistant

## 2021-04-25 ENCOUNTER — Other Ambulatory Visit: Payer: Self-pay

## 2021-04-25 DIAGNOSIS — L821 Other seborrheic keratosis: Secondary | ICD-10-CM | POA: Diagnosis not present

## 2021-04-25 DIAGNOSIS — L820 Inflamed seborrheic keratosis: Secondary | ICD-10-CM | POA: Diagnosis not present

## 2021-04-25 DIAGNOSIS — Z1283 Encounter for screening for malignant neoplasm of skin: Secondary | ICD-10-CM | POA: Diagnosis not present

## 2021-04-25 DIAGNOSIS — D485 Neoplasm of uncertain behavior of skin: Secondary | ICD-10-CM

## 2021-04-25 NOTE — Progress Notes (Signed)
? ?  New Patient ?  ?Subjective  ?Michelle Torres is a 61 y.o. female who presents for the following: New Patient (Initial Visit) (Patient here today to with husband for her first dermatology appointment. Per patiens she has a few lesion checked on her upper torso x 1 year. Per patient no bleeding from any of the lesions. No personal history or family history of atypical moles, melanoma or non mole skin cancer. ). ? ? ?The following portions of the chart were reviewed this encounter and updated as appropriate:  Tobacco  Allergies  Meds  Problems  Med Hx  Surg Hx  Fam Hx   ?  ? ?Objective  ?Well appearing patient in no apparent distress; mood and affect are within normal limits. ? ?A full examination was performed including scalp, head, eyes, ears, nose, lips, neck, chest, axillae, abdomen, back, buttocks, bilateral upper extremities, bilateral lower extremities, hands, feet, fingers, toes, fingernails, and toenails. All findings within normal limits unless otherwise noted below. ? ?Chest - Medial [facility]) ?Stuck-on, waxy papules and plaques.  ? ?head to toe ?No atypical nevi No signs of non-mole skin cancer.  ? ?Left Breast ?Bichromic crusted plaque. ? ? ? ? ? ? ? ?Assessment & Plan  ?Seborrheic keratosis ?Chest - Medial [facility]) ? ?Benign no treatment ? ?Screening for malignant neoplasm of skin ?head to toe ? ?Yearly skin exam.  ? ?Neoplasm of uncertain behavior of skin ?Left Breast ? ?Skin / nail biopsy ?Type of biopsy: tangential   ?Informed consent: discussed and consent obtained   ?Timeout: patient name, date of birth, surgical site, and procedure verified   ?Anesthesia: the lesion was anesthetized in a standard fashion   ?Anesthetic:  1% lidocaine w/ epinephrine 1-100,000 local infiltration ?Instrument used: flexible razor blade   ?Hemostasis achieved with: ferric subsulfate   ?Outcome: patient tolerated procedure well   ?Post-procedure details: wound care instructions given   ? ?Specimen 1 - Surgical  pathology ?Differential Diagnosis: sk ? ?Check Margins: No ? ? ? ? ?I, Richell Corker, PA-C, have reviewed all documentation's for this visit.  The documentation on 04/25/21 for the exam, diagnosis, procedures and orders are all accurate and complete. ?

## 2021-04-25 NOTE — Patient Instructions (Signed)

## 2023-04-15 ENCOUNTER — Telehealth: Payer: Self-pay | Admitting: Diagnostic Neuroimaging

## 2023-04-15 NOTE — Telephone Encounter (Signed)
 Received sleep referral for eval for OSA from [facility]. Placed in sleep referrals box

## 2023-04-26 ENCOUNTER — Telehealth: Payer: Self-pay | Admitting: Neurology

## 2023-07-04 NOTE — Telephone Encounter (Signed)
 Error

## 2023-08-27 ENCOUNTER — Ambulatory Visit: Admitting: Neurology

## 2023-08-27 ENCOUNTER — Encounter: Payer: Self-pay | Admitting: Neurology

## 2023-08-27 VITALS — BP 122/67 | HR 97 | Ht 58.5 in | Wt 187.0 lb

## 2023-08-27 DIAGNOSIS — E66812 Obesity, class 2: Secondary | ICD-10-CM

## 2023-08-27 DIAGNOSIS — E039 Hypothyroidism, unspecified: Secondary | ICD-10-CM | POA: Diagnosis not present

## 2023-08-27 DIAGNOSIS — R0683 Snoring: Secondary | ICD-10-CM | POA: Diagnosis not present

## 2023-08-27 DIAGNOSIS — E559 Vitamin D deficiency, unspecified: Secondary | ICD-10-CM

## 2023-08-27 DIAGNOSIS — G4711 Idiopathic hypersomnia with long sleep time: Secondary | ICD-10-CM

## 2023-08-27 DIAGNOSIS — E1165 Type 2 diabetes mellitus with hyperglycemia: Secondary | ICD-10-CM

## 2023-08-27 DIAGNOSIS — K760 Fatty (change of) liver, not elsewhere classified: Secondary | ICD-10-CM

## 2023-08-27 DIAGNOSIS — G7000 Myasthenia gravis without (acute) exacerbation: Secondary | ICD-10-CM | POA: Diagnosis not present

## 2023-08-27 DIAGNOSIS — K219 Gastro-esophageal reflux disease without esophagitis: Secondary | ICD-10-CM

## 2023-08-27 DIAGNOSIS — F3181 Bipolar II disorder: Secondary | ICD-10-CM

## 2023-08-27 DIAGNOSIS — Z794 Long term (current) use of insulin: Secondary | ICD-10-CM

## 2023-08-27 DIAGNOSIS — E661 Drug-induced obesity: Secondary | ICD-10-CM | POA: Insufficient documentation

## 2023-08-27 NOTE — Patient Instructions (Signed)
 Living With Sleep Apnea Sleep apnea is a condition that affects your breathing while you're sleeping. Your tongue or the tissue in your throat may block the flow of air while you sleep. You may have shallow breathing or stop breathing for short periods of time. The breaks in breathing interrupt the deep sleep that you need to feel rested. Even if you don't wake up from the gaps in breathing, you may feel tired during the day. People with sleep apnea may snore loudly. You may have a headache in the morning and feel anxious or depressed. How can sleep apnea affect me? Sleep apnea increases your chances of being very tired during the day. This is called daytime fatigue. Sleep apnea can also increase your risk of: Heart attack. Stroke. Obesity. Type 2 diabetes. Heart failure. Irregular heartbeat. High blood pressure. If you are very tired during the day, you may be more likely to: Not do well in school or at work. Fall asleep while driving. Have trouble paying attention. Develop depression or anxiety. Have problems having sex. This is called sexual dysfunction. What actions can I take to manage sleep apnea? Sleep apnea treatment  If you were given a device to open your airway while you sleep, use it only as told by your health care provider. You may be given: An oral appliance. This is a mouthpiece that shifts your lower jaw forward. A continuous positive airway pressure (CPAP) device. This blows air through a mask. A nasal expiratory positive airway pressure (EPAP) device. This has valves that you put into each nostril. A bi-level positive airway pressure (BIPAP) device. This blows air through a mask when you breathe in and breathe out. You may need surgery if other treatments don't work for you. Sleep habits Go to sleep and wake up at the same time every day. This helps set your internal clock for sleeping. If you stay up later than usual on weekends, try to get up in the morning within 2  hours of the time you usually wake up. Try to get at least 7-9 hours of sleep each night. Stop using a computer, tablet, and mobile phone a few hours before bedtime. Do not take long naps during the day. If you nap, limit it to 30 minutes. Have a relaxing bedtime routine. Reading or listening to music may relax you and help you sleep. Use your bedroom only for sleep. Keep your television and computer out of your bedroom. Keep your bedroom cool, dark, and quiet. Use a supportive mattress and pillows. Follow your provider's instructions for other changes to sleep habits. Nutrition Do not eat big meals in the evening. Do not have caffeine in the later part of the day. The effects of caffeine can last for more than 5 hours. Follow your provider's instructions for any changes to what you eat and drink. Lifestyle Do not drink alcohol before bedtime. Alcohol can cause you to fall asleep at first, but then it can cause you to wake up in the middle of the night and have trouble getting back to sleep. Do not smoke, vape, or use nicotine or tobacco. Medicines Take over-the-counter and prescription medicines only as told by your provider. Do not use over-the-counter sleep medicine. You may become dependent on this medicine, and it can make sleep apnea worse. Do not take medicines, such as sedatives and narcotics, unless told to by your provider. Activity Exercise on most days, but avoid exercising in the evening. Exercising near bedtime can interfere with sleeping.  If possible, spend time outside every day. Natural light helps with your internal clock. General information Lose weight if you need to. Stay at a healthy weight. If you are having surgery, make sure to tell your provider that you have sleep apnea. You may need to bring your device with you. Keep all follow-up visits. Your provider will want to check on your condition. Where to find more information National Heart, Lung, and Blood  Institute: [employer] This information is not intended to replace advice given to you by your health care provider. Make sure you discuss any questions you have with your health care provider. Document Revised: 05/30/2022 Document Reviewed: 05/30/2022 Elsevier Patient Education  2024 [employer].

## 2023-08-27 NOTE — Progress Notes (Addendum)
 SLEEP MEDICINE CLINIC    Provider:  Dedra Gores, MD  Primary Care Physician:  Juliane Che, PA [address] [place] [address]     Referring Provider: Juliane Che, Pa [address] [place],  [place] [address]          Chief Complaint according to patient   Patient presents with:     New Patient (Initial Visit)          I have the pleasure of seeing Michelle Torres on 08/27/23 a right -handed female with a possible sleep disorder.  She is married to  [name], who is also a patient here.    HISTORY OF PRESENT ILLNESS:  Michelle Torres is a 63 y.o. female patient who is seen upon referral on 08/27/2023 from [place], [place].  for a sleep evaluation , having been more sleepy since she is  off work, and she reports since April 4 th an increase in fatigue as well. Sleep is fragmented.  She has new trouble getting  to sleep in spite of clonazepam  prescribed by her psychiatrist.  She reports progressive sleep inertia, can't wake up easily or be woken up easily when taking naps in daytime.     Chief concern according to patient :  while working the patient had more routines and rituals to her sleep, more order in term of sleep pattern, sleep eval. She states that she has been having increased fatigue at work but is out since April- fell on 2 boxes at work and injured shoulder and wrist. She states that that she was injured at work and has been on workers comp since April. States that she is unsure when she will get to return. She states that since being out of work her sleep schedule is definetely deteriorated   She has been told she snores and loudly.    Sleep relevant medical history:  The patient never had a sleep study. Insomnia was a long standing problem and well controlled by clonazepam , Dr Slater Mam.  Bipolar disorder,   No Nocturia/ or only once-  DM,  obesity, no Tonsillectomy,  no cervical spine injury or surgery/ not TBI,  diplopia,  skewed- Myasthenia gravis dx  at [facility]  and treated with imuran  . GERD, fatty liver, DM on Ozempic , migraine.     Family medical /sleep history: biological father was not well known.   No known other family member on CPAP with OSA, insomnia, sleep walkers.   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 She lives in a household with husband and son ( 60) .  No pets,  lost a dog in 01-2023.   Tobacco use; none.  ETOH use ; seldomly ,  Caffeine intake in form of Coffee( 1 a week ) Soda( /) Tea ( /) or energy drinks Exercise in form of walking. .   Hobbies :  movies.    Sleep habits are as follows: The patient's dinner time is between 6.30-7  PM. The patient goes to bed at 10.30-11 PM and continues to sleep for a total  of  9-10  hours,  9 was 7 or 8 before 0 wakes for one bathroom breaks, the first time at 3 AM.    The preferred sleep position is laterally, sometimes prone,  with the support of 1-2 pillows. Dreams are reportedly rare.  The patient wakes up best with an alarm. 8  AM is the usual rise time. 9 AM is her PT appointment.   She reports not feeling refreshed or restored in AM, with symptoms such as dry mouth, dull morning headaches, and residual fatigue.  Naps are taken frequently, lasting from 45-120  minutes and are less refreshing than nocturnal sleep.    Review of Systems: Out of a complete 14 system review, the patient complains of only the following symptoms, and all other reviewed systems are negative.:  Fatigue, sleepiness , snoring, prolonged sleep time.    How likely are you to doze in the following situations: 0 = not likely, 1 = slight chance, 2 = moderate chance, 3 = high chance   Sitting and Reading? Watching Television? Sitting inactive in a public place (theater or meeting)? As a passenger in a car for an hour without a break? Lying down in the afternoon when circumstances permit? Sitting and talking to  someone? Sitting quietly after lunch without alcohol? In a car, while stopped for a few minutes in traffic?   Total = 10/ 24 points   FSS endorsed at 47/ 63 points.  GDS 9/ 15 .   Social History   Socioeconomic History   Marital status: Married    Spouse name: [name]   Number of children: 2   Years of education: 14   Highest education level: Not on file  Occupational History   Occupation: PROCESSING ASSISTANT    Employer: [employer]  Tobacco Use   Smoking status: Never   Smokeless tobacco: Never  Vaping Use   Vaping status: Never Used  Substance and Sexual Activity   Alcohol use: Yes    Alcohol/week: 1.0 standard drink of alcohol    Types: 1 Standard drinks or equivalent per week    Comment: once every couple months   Drug use: No   Sexual activity: Yes    Partners: Male    Birth control/protection: Post-menopausal  Other Topics Concern   Not on file  Social History Narrative   Lives with her husband.  Their daughter, lives with them intermittently.  Her son is at [facility] and lives with them. Her husband has a child from a previous relationship who lives in [place] .   Social Drivers of Corporate investment banker Strain: Low Risk  (04/11/2023)   Received from [facility]   Overall Financial Resource Strain (CARDIA)    Difficulty of Paying Living Expenses: Not very hard  Food Insecurity: No Food Insecurity (04/11/2023)   Received from [facility]   Hunger Vital Sign    Within the past 12 months, you worried that your food would run out before you got the money to buy more.: Never true    Within the past 12 months, the food you bought just didn't last and you didn't have money to get more.: Never true  Transportation Needs: No Transportation Needs (04/11/2023)   Received [facility] - Transportation    Lack of Transportation (Medical): No    Lack of Transportation (Non-Medical): No  Physical Activity: Insufficiently Active (04/11/2023)    Received from [facility]   Exercise Vital Sign    On average, how many days per week do you engage in moderate to strenuous exercise (like a brisk walk)?: 2 days    On average, how many minutes do you engage in exercise at this level?: 30 min  Stress: Stress Concern Present (04/11/2023)   Received from [facility] - Occupational Stress Questionnaire    Feeling of Stress : To some extent  Social Connections: Moderately Integrated (04/11/2023)   [facility]   Social Network    How would you rate your social network (family, work, friends)?: Adequate participation with social networks    Family History  Problem Relation Age of Onset   Mental illness Father        Manic Depression   Alcohol abuse Father    Stroke Father    Heart disease Father    Arthritis Son    Depression Son    Cancer Mother 38       peritoneal   Breast cancer Maternal Grandmother    Breast cancer Paternal Grandmother     Past Medical History:  Diagnosis Date   Anxiety    Bipolar disorder (HCC)    Closed fracture of proximal end of left humerus with routine healing, subsequent encounter 12/15/2015   Depression    Diabetes mellitus    Fatty liver disease, nonalcoholic    GERD (gastroesophageal reflux disease)    High cholesterol    Hypothyroidism     Past Surgical History:  Procedure Laterality Date   DILATION AND CURETTAGE OF UTERUS     ERCP  03/03/2011   Procedure: ENDOSCOPIC RETROGRADE CHOLANGIOPANCREATOGRAPHY (ERCP);  Surgeon: Princella CHRISTELLA Nida, MD;  Location: [facility] ENDOSCOPY;  Service: Endoscopy;  Laterality: N/A;   LAPAROSCOPIC CHOLECYSTECTOMY  1993   TOE SURGERY Right 04/05/2017   right big toe     Current Outpatient Medications on File Prior to Visit  Medication Sig Dispense Refill   AgaMatrix Ultra-Thin Lancets MISC Use to test blood sugar 3 times a day (Dx: E11.65)     alosetron  (LOTRONEX ) 1 MG tablet Take 0.5 tablets (0.5 mg  total) by mouth 2 (two) times daily. 90 tablet 1   atorvastatin (LIPITOR) 40 MG tablet Take 40 mg by mouth daily.     azelastine  (ASTELIN ) 0.1 % nasal spray Place 2 sprays into both nostrils 2 (two) times daily. Use in each nostril as directed 30 mL 0   calcium  carbonate 1250 MG capsule Take 1,000 mg by mouth daily.     cetirizine (ZYRTEC) 10 MG tablet Take 10 mg by mouth daily.     cholecalciferol (VITAMIN D ) 1000 UNITS tablet Take 5,000 Units by mouth daily.     clonazePAM  (KLONOPIN ) 1 MG tablet Take 3 mg by mouth at bedtime. May take 1/2 to 1 tablet during the day as needed      colestipol (COLESTID) 1 g tablet Take 2 g by mouth 2 (two) times daily.     Continuous Blood Gluc Sensor (DEXCOM G6 SENSOR) MISC Apply topically.     cyclobenzaprine  (FLEXERIL ) 5 MG tablet Take 1-2 tablets (5-10 mg total) by mouth 3 (three) times daily as needed for muscle spasms. 30 tablet 1   divalproex  (DEPAKOTE  ER) 500 MG 24 hr tablet Take 1,500 mg by mouth at bedtime.      empagliflozin (JARDIANCE) 10 MG TABS tablet Take 10 mg by mouth daily.     fluticasone  (FLONASE ) 50 MCG/ACT nasal spray Place 2 sprays into both nostrils daily. 48 g 3   gabapentin  (NEURONTIN ) 600 MG tablet Take 600 mg by mouth at bedtime.       hydrocortisone  (ANUSOL -HC) 2.5 % rectal cream Place rectally 2 (two) times daily.     insulin  lispro (HUMALOG) 200 UNIT/ML KwikPen Inject into the skin.     Insulin  Pen Needle (  BD PEN NEEDLE NANO U/F) 32G X 4 MM MISC Use for injections 4 times a day     lamoTRIgine (LAMICTAL) 100 MG tablet Take 200 mg by mouth every morning.      levothyroxine (SYNTHROID, LEVOTHROID) 75 MCG tablet Take 75 mcg by mouth daily before breakfast.     meclizine  (ANTIVERT ) 25 MG tablet Take 1 tablet (25 mg total) by mouth 3 (three) times daily as needed for dizziness. 30 tablet 0   Melatonin 10 MG CAPS Take by mouth.     montelukast (SINGULAIR) 10 MG tablet Take 10 mg by mouth at bedtime.     naproxen  (NAPROSYN ) 500 MG tablet  Take 500 mg by mouth 2 (two) times daily as needed (for pain).      NEEDLE, DISP, 30 G 30G X 3/4 MISC Use as directed, daily blood sugars ( please dispense what patient had previously) 100 each 3   NOVOLOG  FLEXPEN 100 UNIT/ML FlexPen PLEASE SEE ATTACHED FOR DETAILED DIRECTIONS     omeprazole  (PRILOSEC) 40 MG capsule Take 1 capsule (40 mg total) by mouth daily. 30 capsule 3   ondansetron  (ZOFRAN -ODT) 8 MG disintegrating tablet Take 0.5-1 tablets (4-8 mg total) by mouth every 8 (eight) hours as needed for nausea or vomiting. 30 tablet 1   ONETOUCH VERIO test strip   12   pravastatin (PRAVACHOL) 40 MG tablet Take 40 mg by mouth daily.     pregabalin  (LYRICA ) 75 MG capsule Take 75 mg by mouth every morning.      promethazine  (PHENERGAN ) 12.5 MG suppository Place 1 suppository (12.5 mg total) rectally every 6 (six) hours as needed for nausea or vomiting. 10 suppository 1   promethazine  (PHENERGAN ) 25 MG tablet Take 1 tablet (25 mg total) by mouth every 8 (eight) hours as needed for nausea or vomiting. 20 tablet 0   rizatriptan  (MAXALT -MLT) 10 MG disintegrating tablet Take 1 tablet (10 mg total) by mouth as needed for migraine. May repeat in 2 hours if needed x 2 additional doses. Max 3 tabs/24hrs 10 tablet 5   sertraline  (ZOLOFT ) 25 MG tablet Take 50 mg by mouth daily.     topiramate  (TOPAMAX ) 25 MG tablet Take 50-75 mg by mouth at bedtime.     topiramate  (TOPAMAX ) 50 MG tablet Take 1 tablet (50 mg total) by mouth 2 (two) times daily. 180 tablet 3   TRESIBA FLEXTOUCH 200 UNIT/ML SOPN      TRULICITY 3 MG/0.5ML SOPN SMARTSIG:3 Milligram(s) SUB-Q Once a Week     No current facility-administered medications on file prior to visit.    Allergies  Allergen Reactions   Pyridostigmine Bromide Nausea And Vomiting    violently ill violently ill   Risperidone Other (See Comments)    liver damage enlarged my liver   Metformin  And Related     Severe GI distress   Risperidone And Paliperidone Other  (See Comments)    liver damage     DIAGNOSTIC DATA (LABS, IMAGING, TESTING) - I reviewed patient records, labs, notes, testing and imaging myself where available.  Lab Results  Component Value Date   WBC 7.5 09/11/2017   HGB 14.4 09/11/2017   HCT 44.6 09/11/2017   MCV 90.1 09/11/2017   PLT 355 07/04/2017      Component Value Date/Time   NA 142 09/11/2017 1614   K 4.7 09/11/2017 1614   CL 103 09/11/2017 1614   CO2 25 09/11/2017 1614   GLUCOSE 110 (H) 09/11/2017 1614   GLUCOSE 216 (H)  02/04/2013 1203   BUN 10 09/11/2017 1614   CREATININE 0.74 09/11/2017 1614   CREATININE 0.87 12/16/2012 0919   CALCIUM  10.0 09/11/2017 1614   PROT 7.3 09/11/2017 1614   ALBUMIN 4.4 09/11/2017 1614   AST 27 09/11/2017 1614   ALT 24 09/11/2017 1614   ALKPHOS 94 09/11/2017 1614   BILITOT 0.4 09/11/2017 1614   GFRNONAA 91 09/11/2017 1614   GFRAA 105 09/11/2017 1614   Lab Results  Component Value Date   CHOL 158 07/04/2017   HDL 52 07/04/2017   LDLCALC 79 07/04/2017   TRIG 135 07/04/2017   CHOLHDL 3.0 07/04/2017   Lab Results  Component Value Date   HGBA1C 7.0 (A) 09/11/2017   No results found for: VITAMINB12 Lab Results  Component Value Date   TSH 0.520 09/11/2017    PHYSICAL EXAM:  Today's Vitals   08/27/23 1530  BP: 122/67  Pulse: 97  Weight: 187 lb (84.8 kg)  Height: 4' 10.5 (1.486 m)   Body mass index is 38.42 kg/m.   Wt Readings from Last 3 Encounters:  08/27/23 187 lb (84.8 kg)  12/31/17 192 lb 3.2 oz (87.2 kg)  12/25/17 193 lb (87.5 kg)     Ht Readings from Last 3 Encounters:  08/27/23 4' 10.5 (1.486 m)  12/31/17 4' 11 (1.499 m)  12/25/17 4' 11 (1.499 m)      General: The patient is awake, alert and appears not in acute distress.  The patient is well groomed. Head: Normocephalic, atraumatic.  Neck is supple. Mallampati 2-3,  neck circumference:16 inches . Nasal airflow patent.   Retrognathia is  seen.  Dental status:  biological  Cardiovascular:   Regular rate and cardiac rhythm by pulse,  without distended neck veins. Respiratory: Lungs are clear to auscultation.  Skin:  Without evidence of ankle edema, or rash. Trunk: The patient's posture is erect.   NEUROLOGIC EXAM: The patient is awake and alert, oriented to place and time.   Memory subjective described as intact.  Attention span & concentration ability appears normal.  Speech is fluent,  slight logorrhea, without  dysarthria, dysphonia or aphasia.  Mood and affect are appropriate.   Cranial nerves: no loss of smell or taste reported  Pupils are equal and briskly reactive to light. Funduscopic exam deferred.  Extraocular movements in vertical and horizontal planes were intact and without nystagmus. No Diplopia. Visual fields by finger perimetry are intact. Hearing was intact to soft voice and finger rubbing. Facial sensation intact to fine touch.  Facial motor strength is symmetric and tongue and uvula move midline.  Neck ROM : rotation, tilt and flexion extension were normal for age and shoulder shrug was symmetrical.    Motor exam:  Symmetric bulk, tone and ROM.   Normal tone without cog- wheeling, asymmetric grip strength- weakness on the right since her fall at work . Sensory:  Fine touch, pinprick and vibration were reportedly normal.   Coordination: Rapid alternating movements in the fingers/hands were of normal speed.  The Finger-to-nose maneuver was intact without evidence of ataxia, dysmetria or tremor.   Gait and station: Patient could rise unassisted from a seated position, walked without assistive device.  Stance is of normal width/ base and the patient turned with 3 steps.  Toe and heel walk were deferred.  Deep tendon reflexes: in the  upper and lower extremities are symmetric and intact.  Babinski response was deferred.    ASSESSMENT AND PLAN 63 y.o. year old female  here with:  1) increasing fatigue and sleepiness with non refreshing sleep,  hypersomnia with prolonged sleep duration-  but also snoring loudly, larger neck and large BMI.   2)  concerns are : loss of routines with recent  injury and being out of work.  Sleeping in more often, napping without feeling restored after naps.  Unable to limit naps to power naps.   Hx of bipolar disease, anxiety increased after her psychiatrist and psychologist both announced upcoming retirement.  Fatigue can be related to fear , depression and anxiety.   Diabetes is well controlled on Ozempic. Hopefully she can lose some weight.   Autoimmune disorder ; Myasthenia gravis - al autoimmune disorders are associated with fatigue.    I plan to obtain a HST  and follow up on the results by MyChart.   As the patient's condition requires frequent monitoring and adjustments in the treatment plan, reflecting the ongoing complexity of care.  This provider is the continuing focal point for all needed services for the conditions in the category of organic sleep disorders   I plan to follow up either personally or through our NP within 4-5 months.   I would like to thank Juliane Che, PA and Juliane [place], Pa [address] [place],  [place] [address] for allowing me to meet with and to take care of this pleasant patient.     After spending a total time of  45  minutes face to face and additional time for physical and neurologic examination, review of laboratory studies,  personal review of imaging studies, reports and results of other testing and review of referral information / records as far as provided in visit,   Electronically signed by: Dedra Gores, MD 08/27/2023 3:37 PM  [facility] Sleep Board certified by The [employer] of Sleep Medicine and Diplomate of the [employer] of Sleep Medicine. Board certified In Neurology through the ABPN, Fellow of the [employer] of Neurology.

## 2023-10-15 ENCOUNTER — Telehealth: Payer: Self-pay | Admitting: Neurology

## 2023-10-15 NOTE — Telephone Encounter (Signed)
 Pt called to request to come in at 11:00 instead of 10:30 to pick up at home sleep study device

## 2023-10-15 NOTE — Telephone Encounter (Signed)
 I called the patient and informed her that she can arrive at 11 AM to pick up her HST on 10/22/23.

## 2023-10-22 ENCOUNTER — Ambulatory Visit (INDEPENDENT_AMBULATORY_CARE_PROVIDER_SITE_OTHER): Admitting: Neurology

## 2023-10-22 DIAGNOSIS — G4733 Obstructive sleep apnea (adult) (pediatric): Secondary | ICD-10-CM

## 2023-10-22 DIAGNOSIS — E039 Hypothyroidism, unspecified: Secondary | ICD-10-CM

## 2023-10-22 DIAGNOSIS — F3181 Bipolar II disorder: Secondary | ICD-10-CM

## 2023-10-22 DIAGNOSIS — R0683 Snoring: Secondary | ICD-10-CM

## 2023-10-22 DIAGNOSIS — G4711 Idiopathic hypersomnia with long sleep time: Secondary | ICD-10-CM

## 2023-10-22 DIAGNOSIS — K219 Gastro-esophageal reflux disease without esophagitis: Secondary | ICD-10-CM

## 2023-10-22 DIAGNOSIS — G7000 Myasthenia gravis without (acute) exacerbation: Secondary | ICD-10-CM

## 2023-10-23 NOTE — Progress Notes (Unsigned)
     Michelle Torres 63 year old female 03/28/1960

## 2023-10-24 ENCOUNTER — Ambulatory Visit: Payer: Self-pay | Admitting: Neurology

## 2023-10-24 DIAGNOSIS — G4711 Idiopathic hypersomnia with long sleep time: Secondary | ICD-10-CM | POA: Insufficient documentation

## 2023-10-24 DIAGNOSIS — G4733 Obstructive sleep apnea (adult) (pediatric): Secondary | ICD-10-CM | POA: Insufficient documentation

## 2023-10-24 NOTE — Addendum Note (Signed)
 Addended by: CHALICE SAUNAS on: 10/24/2023 08:15 PM   Modules accepted: Orders

## 2023-10-24 NOTE — Procedures (Signed)
 Michelle Torres 63 year old female 1960/08/03        [facility]   HOME SLEEP TEST REPORT ( by Watch PAT)   STUDY DATE:  10-22-2023   ORDERING CLINICIAN: Dedra Gores, MD     CLINICAL INFORMATION/HISTORY: Michelle Torres was seen on 08-27-2023 and is a right-handed female referred by her primary care provider Rico Kenner, [place].  She reports that she is more sleepy since she has been off work and reports that since April 4 she has suffered an increase in fatigue as well.  Her sleep is very fragmented- she reports new onset trouble of going to  sleep in spite of having clonazepam  prescribed by her psychiatrist.  She also has great trouble to wake up or be woken up, even after short naps in daytime. She reports that as long as she was working she had established routines and rituals to her sleep, she had a certain order in terms of sleep patterns but when she suffered a fall at work injured her shoulder and wrist she had not been able to return and has been on [employer]. since April.  Her sleep schedule deteriorated since not being at work she has been told that she snores and that she snores loudly.  She had no previous sleep studies.  Insomnia has been a very longstanding problem and has been treated with medication by her psychiatrist Dr. Slater Mam.  Bipolar disorder, diabetes mellitus, obesity, skewed diplopia attributed to myasthenia gravis and treated with Imuran  at [facility], gastroesophageal reflux disease, fatty liver disease, she started on Ozempic and she has a history of migraine.     Epworth sleepiness score: 10/24.,  Fatigue severity score endorsed at 47 out of 63 points and used depression score endorsed at 9 out of 15 points.  The neck circumference measured 16 inches, nasal airflow was patent, Mallampati 2, BMI 38.4   FINDINGS/Sleep Summary:   Total Recording Time (hours, min):   Total recording time was 11 hours and 41 minutes  Total Sleep Time (hours, min):     Total sleep time 11 hours and 16 minutes             Percent REM (%): 27.4%                                      Respiratory Indices:   Calculated pAHI (per AASM  guideline); 35.6/h                         REM pAHI: 58.6/h                                               NREM pAHI: 26.7/h                             Positional AHI:    The patient slept exclusively supine  Snoring:    Reached a mean volume of 41 dB and was considered mild.  Oxygen Saturation Statistics:   Oxygen Saturation (%) Mean: 91%              O2 Saturation Range (%):    Between 85 and 98%                                   O2 Saturation (minutes) <89%:    6.4 minutes O2 saturation in minutes < 90%:  37.8 minutes (!)       Pulse Rate Statistics:   Pulse Mean (bpm):   73 bpm              Pulse Range:    Between 62 and 88 bpm, without information about heart rhythm being available.             IMPRESSION:  This HST confirms the presence of very severe and all obstructive sleep apnea associated with oxygen desaturation.  There is a strong REM sleep dependence noted. This pattern is associated with OHV, or obesity hypoventilation. For a patient with a body mass index exceeding 32 and mainly abdominal obesity the treatment of obstructive sleep apnea usually requires positive airway pressure therapy.   The main risk factor is weight and weight loss will reduce the REM sleep dependent component of sleep apnea and the overall apnea index.  Sleep in supine should be avoided because the abdominal weight compresses the possibility for the thorax to expand, leading to obesity hypoventilation.   RECOMMENDATION: I would like for this patient to come to the sleep lab and be titrated under the attendance of a registered polysomnographic technologist.  This will allow the fitting of the most comfortable mask for her and we can also see sleep in various positions such as lateral sleep or  even prone sleep.  I would offer the patient bed 2 which is adjustable.    If her insurance declines we will make do with an auto titration CPAP device by [employer] and with a [employer] manufacture mask to her comfort.  The settings then should be between 5 and 20 cm water with 2 cm EPR, heated humidification and a revisit should be scheduled within 90 days of therapy.   Any patient should be cautioned not to drive, work at heights, or operate dangerous or heavy equipment when tired or sleepy.   Review of good sleep hygiene measures is accessible to any sleep clinic patient and can be reiterated through online material- I we recommend the Guide to better Sleep   by the NIH.   Weight loss and Core Strength improvement is highly recommended for individuals with low muscle tone and/ or a BMI over 30.  Any CPAP patient should be reminded to be fully compliant with PAP therapy , (defined as using PAP therapy for more than 4 hours each night ) with the goal to improve sleep related symptoms and decrease long term cardiovascular risks. Any PAP therapy patient should be reminded, that it may take up to 3 months to get fully used to using PAP and it may take 1-2 weeks for an established CPAP user to acclimatize to changes in pressure or mask. The earlier full compliance is achieved, the better long term compliance tends to be.   Please note that untreated obstructive sleep apnea may carry additional perioperative morbidity. Patients with significant obstructive sleep apnea should receive perioperative PAP therapy and the surgical team should be informed of the diagnosis and degree of  sleep disordered breathing.  Sleep fragmentation in the presence of normal proportional sleep stages is a nonspecific findings and per se does not signify an intrinsic sleep disorder or a cause for the patient's sleep-related symptoms.  Causes include (but are not limited to) the unfamiliarity of sleeping while recorded by HST device  or sleeping in a sleep lab for a full Polysomnography sleep study, but also circadian rhythm disturbances, medication side effects or an underlying mood disorder or medical problem.   The referring physician will be notified of the test results.       INTERPRETING PHYSICIAN:   Dedra Gores, MD  [facility] Sleep Board certified by The [employer] of Sleep Medicine and Diplomate of the [employer] of [employer]. Board certified In Neurology through the ABPN, Fellow of the [employer] of [employer].

## 2023-10-30 ENCOUNTER — Other Ambulatory Visit: Payer: Self-pay | Admitting: Neurology

## 2023-10-30 NOTE — Progress Notes (Signed)
 CPAP/ BIPAP  titration study ordered for hypoxia with OHV .

## 2023-11-06 NOTE — Telephone Encounter (Signed)
 Left voicemail for pt to call back to schedule SS

## 2023-12-23 NOTE — Progress Notes (Signed)
 Patient:  Michelle Torres    DOB: [birth year], age 63 y.o. MRN: [id number]  PCP: Michelle Ned, PA-C  Computer technology was used to create visit note. Consent from the patient/caregiver was obtained prior to its use.       Assessment and Plan   1. Type 2 diabetes mellitus with hyperglycemia, with long-term current use of insulin  (*) (Primary) -     POCT A1C; Future -     Comprehensive Metabolic Panel; Future; Expected date: 12/30/2023 -     Urinalysis; Future; Expected date: 12/23/2023 2. Hyperlipidemia associated with type 2 diabetes mellitus (*) -     atorvastatin (LIPITOR) 40 mg tablet; Take one tablet (40 mg dose) by mouth daily., Starting Mon 12/23/2023, Normal -     Comprehensive Metabolic Panel; Future; Expected date: 12/30/2023 -     Lipid Panel With LDL/HDL Ratio; Future; Expected date: 12/30/2023 3. Primary hypothyroidism -     levothyroxine sodium (SYNTHROID,LEVOTHROID,LEVOXYL) 75 mcg tablet; Take one tablet (75 mcg dose) by mouth daily., Starting Mon 12/23/2023, Normal -     TSH Rfx on Abnormal to Free T4; Future; Expected date: 12/30/2023 4. Need for vaccination -     Flucelvax Trivalent Preservative Free Prefilled Syringe -     Pfizer COMIRNATY COVID-19 12+ years; Future; Expected date: 12/23/2023 5. Vaginal dryness -     BV, Candida, & TV, NAA vag swab Genital; Future 6. Ocular myasthenia gravis (*) -     azaTHIOprine  (IMURAN ) 50 mg tablet; Take three tablets (150 mg dose) by mouth every morning., Starting Mon 12/23/2023, Normal 7. Chronic migraine without aura without status migrainosus, not intractable -     ondansetron  (ZOFRAN -ODT) 8 mg disintegrating tablet; Take one tablet (8 mg dose) by mouth every 8 (eight) hours as needed., Starting Mon 12/23/2023, Normal    Assessment & Plan 1. Diabetes Mellitus: - Blood glucose levels have been well-controlled, with occasional readings below 70 and some postprandial spikes up to 210. - She is currently on Tresiba 20 units  and semaglutide (Ozempic). A foot examination was performed today. - The current regimen will be maintained, and she will continue monitoring her blood glucose levels. - If hypoglycemic episodes increase without a corresponding rise in hyperglycemic episodes, the Tresiba dosage may be reduced.  2. Hypercholesterolemia: - A lipid panel will be ordered to monitor her cholesterol levels.  3. Thyroid  Disorder: - A thyroid  function test will be ordered to monitor her thyroid  levels.  4. Vaginal Dryness and Discharge: - She reports episodic vaginal dryness and discharge, with itching occurring about once a month. - A vaginal swab will be collected for analysis. - She is advised to use baby diaper ointment for relief from itching. If the swab results indicate a yeast infection, appropriate treatment will be prescribed.  5. Recent Illness with Diarrhea: - She experienced flu-like symptoms and diarrhea approximately 2.5 weeks ago, which resolved quickly. - Given the current prevalence of COVID-19 and her symptoms, it is suspected that she may have had COVID-19. - Her appetite is expected to improve over time.  6. Health Maintenance: - She will receive the influenza vaccine and the new COVID-19 booster today. A comprehensive metabolic panel will be ordered to assess her electrolyte balance and kidney and liver function. A urine specimen will also be collected for analysis.  Follow-up: The patient will follow up in 3 months.     Follow up in about 3 months (around 03/24/2024) for re-evaluation of diabetes, cholesterol, and fasting labs, sooner  if needed.  Risks, benefits, and alternatives of the medications and treatment plan prescribed today were discussed, and patient expressed understanding. Plan follow-up as discussed or as needed if any worsening symptoms or change in condition.  Patient voiced understanding of the treatment plan and agreed to attempt to comply.      Subjective   This a  63 y.o. female who presents with:     Patient presents with  . Type 2 diabetes mellitus with hyperglycemia, with long-term  . Hyperlipidemia  . Hypertension     Chief complaint comments reviewed and discussed with patient in detail.  HPI   History of Present Illness The patient is a 63 year old woman who presents for evaluation of diabetes, high cholesterol, and thyroid  disorder.  She reports experiencing hypoglycemic episodes, with blood glucose levels dropping below 70 on several mornings over the past 3 months. Typically, her blood glucose levels range from 100 to 150, but she has recorded occasional spikes up to 210, particularly after consuming foods high in natural sugars. She is currently on Tresiba 20 units and semaglutide (Ozempic).  She reports no chest pain or shortness of breath but does experience dizziness, particularly when she skips her afternoon snack and attends physical therapy. This dizziness is described as a sensation of lightheadedness rather than fainting or weakness. She uses a cane for support at home.  She continues to experience decreased appetite and increased thirst but reports no increased urinary frequency or urgency. She is scheduled for her annual diabetic eye exam and mammogram in December 2025.  She expresses concern about her vaginal health, citing intermittent dryness and monthly vaginal discharge. She also reports episodic itching. She is postmenopausal and has used Monistat in the past month.  Approximately 2.5 weeks ago, she experienced symptoms consistent with influenza, which was not confirmed by testing, and had diarrhea prior to this illness. The diarrhea has since resolved.  She has not yet received her influenza vaccine but received her COVID-19 vaccine in July 2025. She is currently undergoing physical therapy and has appointments with her doctors mid-month.     Last Resulted Components     Date/Time Component Value Flag Units Reference  Range Lab Status   09/04/23 0947 CHOL 139 -- mg/dL 899 - 800 mg/dL Final result   92/83/74 0947 HDL 58 -- mg/dL >60 mg/dL Final result   92/83/74 0947 LDL 64 -- mg/dL 0 - 99 mg/dL Final result   92/83/74 0947 TRIG 93 -- mg/dL 0 - 850 mg/dL Final result   92/83/74 0946 HGBA1C 5.9* Abnormal % 4.8 - 5.6 % Final result   09/04/23 0947 GLUCOSE 89 -- mg/dL 70 - 99 mg/dL Final result         Reviewed and updated this visit by provider: Tobacco  Allergies  Meds  Problems  Med Hx  Surg Hx  Fam Hx  PDMP        Review of Systems  Constitutional:  Positive for appetite change. Negative for activity change, chills and fever.  HENT:  Negative for trouble swallowing and voice change.   Eyes:  Negative for pain and visual disturbance.  Respiratory:  Negative for cough, chest tightness and shortness of breath.   Cardiovascular:  Negative for chest pain, palpitations and leg swelling.  Gastrointestinal:  Positive for diarrhea. Negative for abdominal pain, constipation, nausea and vomiting.  Endocrine: Positive for polydipsia. Negative for cold intolerance, heat intolerance, polyphagia and polyuria.  Genitourinary:  Negative for dysuria, frequency, hematuria and urgency.  Musculoskeletal:  Negative for arthralgias and myalgias.  Skin:  Negative for wound.  Allergic/Immunologic: Negative for environmental allergies.  Neurological:  Positive for light-headedness. Negative for dizziness and headaches.  Hematological:  Negative for adenopathy.  Psychiatric/Behavioral:  Negative for dysphoric mood and sleep disturbance. The patient is not nervous/anxious.          Objective   Vitals:   12/23/23 0855  BP: 122/68  Patient Position: Sitting  Pulse: 82  Temp: 97.8 F (36.6 C)  TempSrc: Temporal  Height: 4' 11 (1.499 m)  Weight: 168 lb 3.2 oz (76.3 kg)  SpO2: 98%  BMI (Calculated): 34   Wt Readings from Last 3 Encounters:  12/23/23 168 lb 3.2 oz (76.3 kg)  09/18/23 186 lb (84.4 kg)   09/04/23 188 lb 6.4 oz (85.5 kg)     Physical Exam Constitutional:      General: She is awake. She is not in acute distress.    Appearance: Normal appearance. She is well-developed and well-groomed. She is not ill-appearing.  HENT:     Head: Normocephalic.     Right Ear: Hearing normal.     Left Ear: Hearing normal.  Eyes:     General: Lids are normal. No scleral icterus.       Right eye: No discharge.        Left eye: No discharge.     Conjunctiva/sclera: Conjunctivae normal.  Cardiovascular:     Rate and Rhythm: Normal rate.  Musculoskeletal:     Cervical back: Neck supple. No tenderness.     Right lower leg: No edema.     Left lower leg: No edema.  Pulmonary:     Effort: Pulmonary effort is normal.  Lymphadenopathy:     Cervical: No cervical adenopathy.  Skin:    General: Skin is warm and dry.  Neurological:     Mental Status: She is alert and oriented to person, place, and time.  Psychiatric:        Attention and Perception: Attention and perception normal.        Mood and Affect: Mood and affect normal.        Speech: Speech normal.        Behavior: Behavior normal. Behavior is cooperative.     Diabetic foot exam:  Left: Monofilament test: Sensation normal  Pulses: normal and present  Skin: Normal and no erythema, no cyanosis or pallor   Other findings: none Right: Monofilament test: Sensation normal  Pulses: normal and present  Skin: Normal and no erythema, no cyanosis or pallor   Other findings: none Exam performed with shoes and socks removed.   Michelle CANDIE Ned, PA-C     *Some images could not be shown.

## 2023-12-31 ENCOUNTER — Encounter (HOSPITAL_COMMUNITY): Payer: Self-pay | Admitting: Emergency Medicine

## 2023-12-31 ENCOUNTER — Emergency Department (HOSPITAL_COMMUNITY)

## 2023-12-31 ENCOUNTER — Emergency Department (HOSPITAL_COMMUNITY)
Admission: EM | Admit: 2023-12-31 | Discharge: 2023-12-31 | Disposition: A | Discharge status: Home / Self Care | Attending: Emergency Medicine | Admitting: Emergency Medicine

## 2023-12-31 ENCOUNTER — Other Ambulatory Visit: Payer: Self-pay

## 2023-12-31 DIAGNOSIS — G928 Other toxic encephalopathy: Secondary | ICD-10-CM | POA: Diagnosis not present

## 2023-12-31 DIAGNOSIS — R1030 Lower abdominal pain, unspecified: Secondary | ICD-10-CM | POA: Diagnosis not present

## 2023-12-31 DIAGNOSIS — R197 Diarrhea, unspecified: Secondary | ICD-10-CM | POA: Insufficient documentation

## 2023-12-31 DIAGNOSIS — W1830XA Fall on same level, unspecified, initial encounter: Secondary | ICD-10-CM | POA: Insufficient documentation

## 2023-12-31 DIAGNOSIS — R531 Weakness: Secondary | ICD-10-CM | POA: Insufficient documentation

## 2023-12-31 DIAGNOSIS — R42 Dizziness and giddiness: Secondary | ICD-10-CM | POA: Diagnosis not present

## 2023-12-31 DIAGNOSIS — E039 Hypothyroidism, unspecified: Secondary | ICD-10-CM | POA: Insufficient documentation

## 2023-12-31 DIAGNOSIS — Z79890 Hormone replacement therapy (postmenopausal): Secondary | ICD-10-CM | POA: Diagnosis not present

## 2023-12-31 DIAGNOSIS — Z794 Long term (current) use of insulin: Secondary | ICD-10-CM | POA: Insufficient documentation

## 2023-12-31 DIAGNOSIS — H532 Diplopia: Secondary | ICD-10-CM | POA: Insufficient documentation

## 2023-12-31 DIAGNOSIS — E119 Type 2 diabetes mellitus without complications: Secondary | ICD-10-CM | POA: Insufficient documentation

## 2023-12-31 DIAGNOSIS — W19XXXA Unspecified fall, initial encounter: Secondary | ICD-10-CM

## 2023-12-31 DIAGNOSIS — H5509 Other forms of nystagmus: Secondary | ICD-10-CM | POA: Diagnosis not present

## 2023-12-31 DIAGNOSIS — R112 Nausea with vomiting, unspecified: Secondary | ICD-10-CM | POA: Diagnosis present

## 2023-12-31 DIAGNOSIS — J189 Pneumonia, unspecified organism: Secondary | ICD-10-CM | POA: Diagnosis not present

## 2023-12-31 LAB — COMPREHENSIVE METABOLIC PANEL WITH GFR
ALT: 14 U/L (ref 0–44)
AST: 21 U/L (ref 15–41)
Albumin: 3.6 g/dL (ref 3.5–5.0)
Alkaline Phosphatase: 47 U/L (ref 38–126)
Anion gap: 14 (ref 5–15)
BUN: 11 mg/dL (ref 8–23)
CO2: 22 mmol/L (ref 22–32)
Calcium: 9.2 mg/dL (ref 8.9–10.3)
Chloride: 102 mmol/L (ref 98–111)
Creatinine, Ser: 0.81 mg/dL (ref 0.44–1.00)
GFR, Estimated: >60 mL/min (ref >60)
Glucose, Bld: 189 mg/dL — ABNORMAL HIGH (ref 70–99)
Potassium: 3.2 mmol/L — ABNORMAL LOW (ref 3.5–5.1)
Sodium: 138 mmol/L (ref 135–145)
Total Bilirubin: 0.8 mg/dL (ref 0.0–1.2)
Total Protein: 6.5 g/dL (ref 6.5–8.1)

## 2023-12-31 LAB — RAPID URINE DRUG SCREEN, HOSP PERFORMED
Amphetamines: NOT DETECTED
Barbiturates: NOT DETECTED
Benzodiazepines: NOT DETECTED
Cocaine: NOT DETECTED
Opiates: NOT DETECTED
Tetrahydrocannabinol: NOT DETECTED

## 2023-12-31 LAB — DIFFERENTIAL
Abs Immature Granulocytes: 0.12 K/uL — ABNORMAL HIGH (ref 0.00–0.07)
Basophils Absolute: 0.1 K/uL (ref 0.0–0.1)
Basophils Relative: 1 %
Eosinophils Absolute: 0 K/uL (ref 0.0–0.5)
Eosinophils Relative: 0 %
Immature Granulocytes: 1 %
Lymphocytes Relative: 12 %
Lymphs Abs: 1.7 K/uL (ref 0.7–4.0)
Monocytes Absolute: 1.1 K/uL — ABNORMAL HIGH (ref 0.1–1.0)
Monocytes Relative: 8 %
Neutro Abs: 10.8 K/uL — ABNORMAL HIGH (ref 1.7–7.7)
Neutrophils Relative %: 78 %

## 2023-12-31 LAB — CBC
HCT: 46.1 % — ABNORMAL HIGH (ref 36.0–46.0)
Hemoglobin: 15.2 g/dL — ABNORMAL HIGH (ref 12.0–15.0)
MCH: 31.8 pg (ref 26.0–34.0)
MCHC: 33 g/dL (ref 30.0–36.0)
MCV: 96.4 fL (ref 80.0–100.0)
Platelets: 234 K/uL (ref 150–400)
RBC: 4.78 MIL/uL (ref 3.87–5.11)
RDW: 15.5 % (ref 11.5–15.5)
WBC: 13.8 K/uL — ABNORMAL HIGH (ref 4.0–10.5)
nRBC: 0 % (ref 0.0–0.2)

## 2023-12-31 LAB — I-STAT CHEM 8, ED
BUN: 12 mg/dL (ref 8–23)
Calcium, Ion: 1.13 mmol/L — ABNORMAL LOW (ref 1.15–1.40)
Chloride: 104 mmol/L (ref 98–111)
Creatinine, Ser: 0.9 mg/dL (ref 0.44–1.00)
Glucose, Bld: 187 mg/dL — ABNORMAL HIGH (ref 70–99)
HCT: 45 % (ref 36.0–46.0)
Hemoglobin: 15.3 g/dL — ABNORMAL HIGH (ref 12.0–15.0)
Potassium: 3.3 mmol/L — ABNORMAL LOW (ref 3.5–5.1)
Sodium: 138 mmol/L (ref 135–145)
TCO2: 24 mmol/L (ref 22–32)

## 2023-12-31 LAB — AMMONIA: Ammonia: <13 umol/L (ref 9–35)

## 2023-12-31 LAB — RESP PANEL BY RT-PCR (RSV, FLU A&B, COVID)  RVPGX2
Influenza A by PCR: NEGATIVE
Influenza B by PCR: NEGATIVE
Resp Syncytial Virus by PCR: NEGATIVE
SARS Coronavirus 2 by RT PCR: NEGATIVE

## 2023-12-31 LAB — VITAMIN B12: Vitamin B-12: 753 pg/mL (ref 180–914)

## 2023-12-31 LAB — PROTIME-INR
INR: 1 (ref 0.8–1.2)
Prothrombin Time: 13.8 s (ref 11.4–15.2)

## 2023-12-31 LAB — CBG MONITORING, ED: Glucose-Capillary: 183 mg/dL — ABNORMAL HIGH (ref 70–99)

## 2023-12-31 LAB — FOLATE: Folate: >20 ng/mL (ref >5.9)

## 2023-12-31 LAB — APTT: aPTT: 22 s — ABNORMAL LOW (ref 24–36)

## 2023-12-31 LAB — ETHANOL: Alcohol, Ethyl (B): <15 mg/dL (ref <15)

## 2023-12-31 LAB — TSH: TSH: 0.325 u[IU]/mL — ABNORMAL LOW (ref 0.350–4.500)

## 2023-12-31 MED ORDER — ONDANSETRON 4 MG PO TBDP: 4.0000 mg | ORAL_TABLET | Freq: Three times a day (TID) | ORAL | 0 refills | Status: AC | PRN

## 2023-12-31 MED ORDER — SODIUM CHLORIDE 0.9% FLUSH: 3.0000 mL | Freq: Once | INTRAVENOUS | Status: DC

## 2023-12-31 MED ORDER — IOHEXOL 350 MG/ML SOLN
75.0000 mL | Freq: Once | INTRAVENOUS | Status: AC | PRN
  Administered 2023-12-31: 75 mL via INTRAVENOUS

## 2023-12-31 MED ORDER — DOXYCYCLINE HYCLATE 100 MG PO CAPS: 100.0000 mg | ORAL_CAPSULE | Freq: Two times a day (BID) | ORAL | 0 refills | Status: AC

## 2023-12-31 MED ORDER — LOPERAMIDE HCL 2 MG PO CAPS: 2.0000 mg | ORAL_CAPSULE | Freq: Four times a day (QID) | ORAL | 0 refills | Status: AC | PRN

## 2023-12-31 MED ORDER — SODIUM CHLORIDE 0.9 % IV BOLUS
1000.0000 mL | Freq: Once | INTRAVENOUS | Status: AC
  Administered 2023-12-31: 1000 mL via INTRAVENOUS

## 2023-12-31 NOTE — ED Provider Notes (Addendum)
 [facility] Emergency Department Provider Note MRN:  [id number]  Arrival date & time: 12/31/23     Chief Complaint   Code Stroke   History of Present Illness   Michelle Torres is a 63 y.o. year-old female with a history of diabetes presenting to the ED with chief complaint of code stroke.  Patient has been having lower abdominal pain for about a week with poor p.o. intake.  Associated with nausea, occasional vomiting, persistent diarrhea during this time.  Woke up this evening with nausea vomiting and diarrhea and then was profoundly weak, could not walk.  Code stroke called by EMS.  Review of Systems  A thorough review of systems was obtained and all systems are negative except as noted in the HPI and PMH.   Patient's Health History    Past Medical History:  Diagnosis Date   Anxiety    Bipolar disorder (HCC)    Closed fracture of proximal end of left humerus with routine healing, subsequent encounter 12/15/2015   Depression    Diabetes mellitus    Fatty liver disease, nonalcoholic    GERD (gastroesophageal reflux disease)    High cholesterol    Hypothyroidism     Past Surgical History:  Procedure Laterality Date   DILATION AND CURETTAGE OF UTERUS     ERCP  03/03/2011   Procedure: ENDOSCOPIC RETROGRADE CHOLANGIOPANCREATOGRAPHY (ERCP);  Surgeon: Princella CHRISTELLA Nida, MD;  Location: [facility] ENDOSCOPY;  Service: Endoscopy;  Laterality: N/A;   LAPAROSCOPIC CHOLECYSTECTOMY  1993   TOE SURGERY Right 04/05/2017   right big toe    Family History  Problem Relation Age of Onset   Mental illness Father        Manic Depression   Alcohol abuse Father    Stroke Father    Heart disease Father    Arthritis Son    Depression Son    Cancer Mother 59       peritoneal   Breast cancer Maternal Grandmother    Breast cancer Paternal Grandmother     Social History   Socioeconomic History   Marital status: Married    Spouse name: Gladis Amrhein   Number of children: 2    Years of education: 14   Highest education level: Not on file  Occupational History   Occupation: PROCESSING ASSISTANT    Employer: [employer]  Tobacco Use   Smoking status: Never   Smokeless tobacco: Never  Vaping Use   Vaping status: Never Used  Substance and Sexual Activity   Alcohol use: Yes    Alcohol/week: 1.0 standard drink of alcohol    Types: 1 Standard drinks or equivalent per week    Comment: once every couple months   Drug use: No   Sexual activity: Yes    Partners: Male    Birth control/protection: Post-menopausal  Other Topics Concern   Not on file  Social History Narrative   Lives with her husband.  Their daughter, lives with them intermittently.  Her son is at [facility] and lives with them. Her husband has a child from a previous relationship who lives in [place] .   Social Drivers of Health   Financial Resource Strain: Medium Risk (08/28/2023)   Received from [facility]   Overall Financial Resource Strain (CARDIA)    Difficulty of Paying Living Expenses: Somewhat hard  Food Insecurity: Food Insecurity Present (08/28/2023)   Received from [facility]   Hunger Vital Sign    Within the past 12 months, you worried  that your food would run out before you got the money to buy more.: Sometimes true    Within the past 12 months, the food you bought just didn't last and you didn't have money to get more.: Never true  Transportation Needs: No Transportation Needs (08/28/2023)   Received from [facility]   PRAPARE - Transportation    Lack of Transportation (Medical): No    Lack of Transportation (Non-Medical): No  Physical Activity: Insufficiently Active (08/28/2023)   Received from [facility]   Exercise Vital Sign    On average, how many days per week do you engage in moderate to strenuous exercise (like a brisk walk)?: 2 days    On average, how many minutes do you engage in exercise at this level?: 10 min  Stress: Stress Concern Present (08/28/2023)   Received  from [facility] - Occupational Stress Questionnaire    Feeling of Stress : Rather much  Social Connections: Socially Isolated (08/28/2023)   Received from [facility]   Social Network    How would you rate your social network (family, work, friends)?: Little participation, lonely and socially isolated  Intimate Partner Violence: Not At Risk (08/28/2023)   Received from [facility]   HITS    Over the last 12 months how often did your partner physically hurt you?: Never    Over the last 12 months how often did your partner insult you or talk down to you?: Rarely    Over the last 12 months how often did your partner threaten you with physical harm?: Never    Over the last 12 months how often did your partner scream or curse at you?: Rarely     Physical Exam   Vitals:   12/31/23 0600 12/31/23 0615  BP: 117/61 121/63  Pulse: 79 76  Resp: 19 19  Temp:    SpO2: 97% 97%    CONSTITUTIONAL: Chronically ill-appearing, NAD NEURO/PSYCH:  Alert and oriented x 3, no focal deficits EYES:  eyes equal and reactive ENT/NECK:  no LAD, no JVD CARDIO: Regular rate, well-perfused, normal S1 and S2 PULM:  CTAB no wheezing or rhonchi GI/GU:  non-distended, non-tender MSK/SPINE:  No gross deformities, no edema SKIN:  no rash, atraumatic   *Additional and/or pertinent findings included in MDM below  Diagnostic and Interventional Summary    EKG Interpretation Date/Time:  Tuesday December 31 2023 03:22:02 EST Ventricular Rate:  69 PR Interval:  203 QRS Duration:  96 QT Interval:  520 QTC Calculation: 558 R Axis:   -49  Text Interpretation: Sinus rhythm Left anterior fascicular block Abnormal R-wave progression, late transition Nonspecific T abnrm, anterolateral leads Prolonged QT interval Confirmed by Theadore Sharper [phone]) on 12/31/2023 4:12:01 AM       Labs Reviewed  APTT - Abnormal; Notable for the following components:      Result Value    aPTT 22 (*)    All other components within normal limits  CBC - Abnormal; Notable for the following components:   WBC 13.8 (*)    Hemoglobin 15.2 (*)    HCT 46.1 (*)    All other components within normal limits  DIFFERENTIAL - Abnormal; Notable for the following components:   Neutro Abs 10.8 (*)    Monocytes Absolute 1.1 (*)    Abs Immature Granulocytes 0.12 (*)    All other components within normal limits  COMPREHENSIVE METABOLIC PANEL WITH GFR - Abnormal; Notable for the following components:  Potassium 3.2 (*)    Glucose, Bld 189 (*)    All other components within normal limits  TSH - Abnormal; Notable for the following components:   TSH 0.325 (*)    All other components within normal limits  I-STAT CHEM 8, ED - Abnormal; Notable for the following components:   Potassium 3.3 (*)    Glucose, Bld 187 (*)    Calcium , Ion 1.13 (*)    Hemoglobin 15.3 (*)    All other components within normal limits  CBG MONITORING, ED - Abnormal; Notable for the following components:   Glucose-Capillary 183 (*)    All other components within normal limits  RESP PANEL BY RT-PCR (RSV, FLU A&B, COVID)  RVPGX2  PROTIME-INR  ETHANOL  VITAMIN B12  FOLATE  AMMONIA  RAPID URINE DRUG SCREEN, HOSP PERFORMED  VITAMIN B1    CT ABDOMEN PELVIS WO CONTRAST  Final Result    MR BRAIN WO CONTRAST  Final Result    CT ANGIO HEAD NECK W WO CM (CODE STROKE)  Final Result    CT HEAD CODE STROKE WO CONTRAST  Final Result      Medications  sodium chloride  flush (NS) 0.9 % injection 3 mL (3 mLs Intravenous Not Given 12/31/23 0334)  iohexol  (OMNIPAQUE ) 350 MG/ML injection 75 mL (75 mLs Intravenous Contrast Given 12/31/23 0248)  sodium chloride  0.9 % bolus 1,000 mL (0 mLs Intravenous Stopped 12/31/23 0617)     Procedures  /  Critical Care Procedures  ED Course and Medical Decision Making  Initial Impression and Ddx Favoring metabolic cause of patient's weakness, differential diagnosis includes  electrolyte disturbance, dehydration, appendicitis, diverticulitis.  Given the neurological complaints, neurology evaluating for stroke.  Past medical/surgical history that increases complexity of ED encounter: Diabetes  Interpretation of Diagnostics I personally reviewed the EKG and my interpretation is as follows: Sinus rhythm  Labs reveal mild leukocytosis, otherwise no significant blood count or electrolyte disturbance.  CNS imaging is without evidence of stroke.  Patient Reassessment and Ultimate Disposition/Management     CT abdomen large unremarkable.  There is evidence of pneumonia in the lower lobes.  Patient has been having cough recently.  Patient has normal vital signs, well-appearing, appropriate for discharge on antibiotics.  7:30 AM update: Confirmed with neurology that EEG can be performed in the outpatient setting given the patient is much improved with normal mental status at this time.  Patient management required discussion with the following services or consulting groups:  None  Complexity of Problems Addressed Acute illness or injury that poses threat of life of bodily function  Additional Data Reviewed and Analyzed Further history obtained from: Further history from spouse/family member  Additional Factors Impacting ED Encounter Risk Consideration of hospitalization  Ozell HERO. Theadore, MD [facility] Emergency Medicine [facility] [email or web address]  Final Clinical Impressions(s) / ED Diagnoses     ICD-10-CM   1. Weakness  R53.1     2. Community acquired pneumonia, unspecified laterality  J18.9       ED Discharge Orders          Ordered    doxycycline  (VIBRAMYCIN ) 100 MG capsule  2 times daily        12/31/23 0715    ondansetron  (ZOFRAN -ODT) 4 MG disintegrating tablet  Every 8 hours PRN        12/31/23 0715    loperamide (IMODIUM) 2 MG capsule  4 times daily PRN        12/31/23 0715  Discharge Instructions  Discussed with and Provided to Patient:     Discharge Instructions      You were evaluated in the Emergency Department and after careful evaluation, we did not find any emergent condition requiring admission or further testing in the hospital.  Your exam/testing today is overall reassuring.  Symptoms may be related to pneumonia.  Recommend taking the doxycycline  antibiotic as directed.  Use the Imodium as needed for diarrhea.  Use Zofran  as needed for nausea.  Please return to the Emergency Department if you experience any worsening of your condition.   Thank you for allowing us  to be a part of your care.       Theadore Ozell HERO, MD 12/31/23 [id number]    Theadore Ozell HERO, MD 12/31/23 [id number]    Theadore Ozell HERO, MD 12/31/23 [phone]

## 2023-12-31 NOTE — ED Notes (Signed)
 CCMD called.

## 2023-12-31 NOTE — ED Triage Notes (Signed)
 Pt arrive by [facility] from home for c/o stroke like symptoms that started today when she woke up around 2 am. Pt LSW last night at 23:30 pm before going to bed.  Woke up with difficulty speaking, double vision and dizziness. Had a fall and hit the head after waking up today. No on blood thinners. BP120/50, HR 60, R 20 RR, SPO2 90% RA. CBG 230. 4 mg Zofran  given by EMS pta.

## 2023-12-31 NOTE — Discharge Instructions (Signed)
 You were evaluated in the Emergency Department and after careful evaluation, we did not find any emergent condition requiring admission or further testing in the hospital.  Your exam/testing today is overall reassuring.  Symptoms may be related to pneumonia.  Recommend taking the doxycycline  antibiotic as directed.  Use the Imodium as needed for diarrhea.  Use Zofran  as needed for nausea.  Please return to the Emergency Department if you experience any worsening of your condition.   Thank you for allowing us  to be a part of your care.

## 2023-12-31 NOTE — Code Documentation (Signed)
 Stroke Response Nurse Documentation Code Documentation  Elianis Fischbach is a 63 y.o. female arriving to [facility]  via [facility] on 11/11 with past medical hx of  Bipolar disorder, depression, DM2, non alcoholic farry liver, GERD, HLD, hypothyroidism . On No antithrombotic. Code stroke was activated by EMS.   Patient from home where she was LKW at 2330 and now complaining of dizzyness and word difficulty.   Stroke team at the bedside on patient arrival. Labs drawn and patient cleared for CT by Dr. Theadore. Patient to CT with team. NIHSS 2, see documentation for details and code stroke times. Patient with decreased LOC and Expressive aphasia  on exam. The following imaging was completed:  CT Head, CTA, and MRI. Patient is not a candidate for IV Thrombolytic due to MRI neg. Patient is not a candidate for IR due to No LVO.   Code stroke cancelled.    Bedside handoff with ED RN Jinnie.    Griselda Alm ORN  Rapid Response RN

## 2023-12-31 NOTE — Consult Note (Signed)
 NEUROLOGY CONSULT NOTE   Date of service: December 31, 2023 Patient Name: Michelle Torres MRN:  [id number] DOB:  1960-09-07 Chief Complaint: nystagmus, nausea, dizziness and speech difficulty Requesting Provider: Theadore Ozell HERO, MD  History of Present Illness  Michelle Torres is a 63 y.o. female with hx of Bipolar disorder, depression, DM2, non alcoholic farry liver, GERD, HLD, hypothyroidism, ocular myasthenia who per EMS went to bed at 2330 and woke up at 0200 with dizziness, nausea, speech difficulty. She fell and hit hear head. EMS called and she was brought in as a code stroke.  I spoke with her husband who adamantly denies that LKW is 2300. She has been weak all over for a week atleast. She reports diarrhea. Been sick all day yesterday and reported some double vision. Husband and son have been helping her. They essentially had to drag her to the bed at 2300 and had told her to call them on the phone if she needs anything. She attempted to get up and fell and threw up on the bathroom floor. She hit her head. Had a second episode of vomiting. She is confused. Had difficulty recalling her birthday, daughters name and took her a wile before she could tell the answers. She is lethargic and continues to be confused.  Husband reports that PCP thought she had COVID last week but did not check for it for some reason?  She has poor appetite and not eating and drinking a lot.  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  CT Head w/o contrast with no acute abnormalities. CTA with no LVO. MRI Brain is negative for acute stroke.  LKW: a week ago atleast. Modified rankin score: 1-No significant post stroke disability and can perform usual duties with stroke symptoms IV Thrombolysis: not offered, no stroke on MRI Brain EVT: not offered, no stroke on MRI Brain  NIHSS components Score: Comment  1a Level of Conscious 0[]  1[x]  2[]  3[]      1b LOC  Questions 0[x]  1[]  2[]       1c LOC Commands 0[x]  1[]  2[]       2 Best Gaze 0[x]  1[]  2[]       3 Visual 0[x]  1[]  2[]  3[]      4 Facial Palsy 0[x]  1[]  2[]  3[]      5a Motor Arm - left 0[x]  1[]  2[]  3[]  4[]  UN[]    5b Motor Arm - Right 0[x]  1[]  2[]  3[]  4[]  UN[]    6a Motor Leg - Left 0[x]  1[]  2[]  3[]  4[]  UN[]    6b Motor Leg - Right 0[x]  1[]  2[]  3[]  4[]  UN[]    7 Limb Ataxia 0[x]  1[]  2[]  UN[]      8 Sensory 0[x]  1[]  2[]  UN[]      9 Best Language 0[]  1[x]  2[]  3[]      10 Dysarthria 0[x]  1[]  2[]  UN[]      11 Extinct. and Inattention 0[x]  1[]  2[]       TOTAL: 2      ROS  Comprehensive ROS performed and pertinent positives documented in HPI   Past History   Past Medical History:  Diagnosis Date   Anxiety    Bipolar disorder (HCC)    Closed fracture of proximal end of left humerus with routine healing, subsequent encounter 12/15/2015   Depression    Diabetes mellitus    Fatty liver disease, nonalcoholic    GERD (gastroesophageal reflux disease)    High cholesterol    Hypothyroidism  Past Surgical History:  Procedure Laterality Date   DILATION AND CURETTAGE OF UTERUS     ERCP  03/03/2011   Procedure: ENDOSCOPIC RETROGRADE CHOLANGIOPANCREATOGRAPHY (ERCP);  Surgeon: Princella CHRISTELLA Nida, MD;  Location: [facility] [place] ENDOSCOPY;  Service: Endoscopy;  Laterality: N/A;   LAPAROSCOPIC CHOLECYSTECTOMY  1993   TOE SURGERY Right 04/05/2017   right big toe    Family History: Family History  Problem Relation Age of Onset   Mental illness Father        Manic Depression   Alcohol abuse Father    Stroke Father    Heart disease Father    Arthritis Son    Depression Son    Cancer Mother 80       peritoneal   Breast cancer Maternal Grandmother    Breast cancer Paternal Grandmother     Social History  reports that she has never smoked. She has never used smokeless tobacco. She reports current alcohol use of about 1.0 standard drink of alcohol per week. She reports that she does not use drugs.  Allergies   Allergen Reactions   Pyridostigmine Bromide Nausea And Vomiting    violently ill violently ill   Risperidone Other (See Comments)    liver damage enlarged my liver   Metformin  And Related     Severe GI distress   Risperidone And Paliperidone Other (See Comments)    liver damage    Medications   Current Facility-Administered Medications:    sodium chloride  flush (NS) 0.9 % injection 3 mL, 3 mL, Intravenous, Once, Bero, Michael M, MD  Current Outpatient Medications:    AgaMatrix Ultra-Thin Lancets MISC, Use to test blood sugar 3 times a day (Dx: E11.65), Disp: , Rfl:    alosetron  (LOTRONEX ) 1 MG tablet, Take 0.5 tablets (0.5 mg total) by mouth 2 (two) times daily., Disp: 90 tablet, Rfl: 1   atorvastatin (LIPITOR) 40 MG tablet, Take 40 mg by mouth daily., Disp: , Rfl:    azelastine  (ASTELIN ) 0.1 % nasal spray, Place 2 sprays into both nostrils 2 (two) times daily. Use in each nostril as directed, Disp: 30 mL, Rfl: 0   calcium  carbonate 1250 MG capsule, Take 1,000 mg by mouth daily., Disp: , Rfl:    cetirizine (ZYRTEC) 10 MG tablet, Take 10 mg by mouth daily., Disp: , Rfl:    cholecalciferol (VITAMIN D ) 1000 UNITS tablet, Take 5,000 Units by mouth daily., Disp: , Rfl:    clonazePAM  (KLONOPIN ) 1 MG tablet, Take 3 mg by mouth at bedtime. May take 1/2 to 1 tablet during the day as needed , Disp: , Rfl:    colestipol (COLESTID) 1 g tablet, Take 2 g by mouth 2 (two) times daily., Disp: , Rfl:    Continuous Blood Gluc Sensor (DEXCOM G6 SENSOR) MISC, Apply topically., Disp: , Rfl:    cyclobenzaprine  (FLEXERIL ) 5 MG tablet, Take 1-2 tablets (5-10 mg total) by mouth 3 (three) times daily as needed for muscle spasms., Disp: 30 tablet, Rfl: 1   divalproex  (DEPAKOTE  ER) 500 MG 24 hr tablet, Take 1,500 mg by mouth at bedtime. , Disp: , Rfl:    empagliflozin (JARDIANCE) 10 MG TABS tablet, Take 10 mg by mouth daily., Disp: , Rfl:    fluticasone  (FLONASE ) 50 MCG/ACT nasal spray, Place 2 sprays  into both nostrils daily., Disp: 48 g, Rfl: 3   gabapentin  (NEURONTIN ) 600 MG tablet, Take 600 mg by mouth at bedtime.  , Disp: , Rfl:    hydrocortisone  (ANUSOL -HC) 2.5 % rectal  cream, Place rectally 2 (two) times daily., Disp: , Rfl:    insulin  lispro (HUMALOG) 200 UNIT/ML KwikPen, Inject into the skin., Disp: , Rfl:    Insulin  Pen Needle (BD PEN NEEDLE NANO U/F) 32G X 4 MM MISC, Use for injections 4 times a day, Disp: , Rfl:    lamoTRIgine (LAMICTAL) 100 MG tablet, Take 200 mg by mouth every morning. , Disp: , Rfl:    levothyroxine (SYNTHROID, LEVOTHROID) 75 MCG tablet, Take 75 mcg by mouth daily before breakfast., Disp: , Rfl:    meclizine  (ANTIVERT ) 25 MG tablet, Take 1 tablet (25 mg total) by mouth 3 (three) times daily as needed for dizziness., Disp: 30 tablet, Rfl: 0   Melatonin 10 MG CAPS, Take by mouth., Disp: , Rfl:    montelukast (SINGULAIR) 10 MG tablet, Take 10 mg by mouth at bedtime., Disp: , Rfl:    naproxen  (NAPROSYN ) 500 MG tablet, Take 500 mg by mouth 2 (two) times daily as needed (for pain). , Disp: , Rfl:    NEEDLE, DISP, 30 G 30G X 3/4 MISC, Use as directed, daily blood sugars ( please dispense what patient had previously), Disp: 100 each, Rfl: 3   NOVOLOG  FLEXPEN 100 UNIT/ML FlexPen, PLEASE SEE ATTACHED FOR DETAILED DIRECTIONS, Disp: , Rfl:    omeprazole  (PRILOSEC) 40 MG capsule, Take 1 capsule (40 mg total) by mouth daily., Disp: 30 capsule, Rfl: 3   ondansetron  (ZOFRAN -ODT) 8 MG disintegrating tablet, Take 0.5-1 tablets (4-8 mg total) by mouth every 8 (eight) hours as needed for nausea or vomiting., Disp: 30 tablet, Rfl: 1   ONETOUCH VERIO test strip, , Disp: , Rfl: 12   pravastatin (PRAVACHOL) 40 MG tablet, Take 40 mg by mouth daily., Disp: , Rfl:    pregabalin  (LYRICA ) 75 MG capsule, Take 75 mg by mouth every morning. , Disp: , Rfl:    promethazine  (PHENERGAN ) 12.5 MG suppository, Place 1 suppository (12.5 mg total) rectally every 6 (six) hours as needed for nausea or  vomiting., Disp: 10 suppository, Rfl: 1   promethazine  (PHENERGAN ) 25 MG tablet, Take 1 tablet (25 mg total) by mouth every 8 (eight) hours as needed for nausea or vomiting., Disp: 20 tablet, Rfl: 0   rizatriptan  (MAXALT -MLT) 10 MG disintegrating tablet, Take 1 tablet (10 mg total) by mouth as needed for migraine. May repeat in 2 hours if needed x 2 additional doses. Max 3 tabs/24hrs, Disp: 10 tablet, Rfl: 5   sertraline  (ZOLOFT ) 25 MG tablet, Take 50 mg by mouth daily., Disp: , Rfl:    topiramate  (TOPAMAX ) 25 MG tablet, Take 50-75 mg by mouth at bedtime., Disp: , Rfl:    topiramate  (TOPAMAX ) 50 MG tablet, Take 1 tablet (50 mg total) by mouth 2 (two) times daily., Disp: 180 tablet, Rfl: 3   TRESIBA FLEXTOUCH 200 UNIT/ML SOPN, , Disp: , Rfl:    TRULICITY 3 MG/0.5ML SOPN, SMARTSIG:3 Milligram(s) SUB-Q Once a Week, Disp: , Rfl:   Vitals   Vitals:   12/31/23 0200  Weight: 74.5 kg  Height: 4' 10 (1.473 m)    Body mass index is 34.33 kg/m.   Physical Exam   General: Laying comfortably in bed; in no acute distress.  HENT: Normal oropharynx and mucosa. Normal external appearance of ears and nose.  Neck: Supple, no pain or tenderness  CV: No JVD. No peripheral edema.  Pulmonary: Symmetric Chest rise. Normal respiratory effort.  Abdomen: Soft to touch, non-tender.  Ext: No cyanosis, edema, or deformity  Skin: No  rash. Normal palpation of skin.   Musculoskeletal: Normal digits and nails by inspection. No clubbing.   Neurologic Examination  Mental status/Cognition: drowsy with poor attention, oriented to self, place, month and year. Speech/language: Fluent, comprehension intact, names most objects, repetition intact.  Cranial nerves:   CN II Pupils equal and reactive to light, no VF deficits    CN III,IV,VI EOM intact, no gaze preference or deviation, has gaze evoked nystagmus which per husband is chronic.   CN V normal sensation in V1, V2, and V3 segments bilaterally    CN VII no  asymmetry, no nasolabial fold flattening    CN VIII normal hearing to speech    CN IX & X normal palatal elevation, no uvular deviation    CN XI 5/5 head turn and 5/5 shoulder shrug bilaterally    CN XII midline tongue protrusion    Motor:  Muscle bulk: normal, tone normal, pronator drift none tremor none Mvmt Root Nerve  Muscle Right Left Comments  SA C5/6 Ax Deltoid 5 5   EF C5/6 Mc Biceps 5 5   EE C6/7/8 Rad Triceps 5 5   WF C6/7 Med FCR     WE C7/8 PIN ECU     F Ab C8/T1 U ADM/FDI 5 5   HF L1/2/3 Fem Illopsoas 5 5   KE L2/3/4 Fem Quad 5 5   DF L4/5 D Peron Tib Ant 5 5   PF S1/2 Tibial Grc/Sol 5 5    Sensation:  Light touch Intact throughout   Pin prick    Temperature    Vibration   Proprioception    Coordination/Complex Motor:  - Finger to Nose intact BL - Heel to shin intact BL - Rapid alternating movement are normal - Gait: deferred.  Labs/Imaging/Neurodiagnostic studies   CBC:  Recent Labs  Lab 01/07/24 0240  HGB 15.3*  HCT 45.0   Basic Metabolic Panel:  Lab Results  Component Value Date   NA 138 2024-01-07   K 3.3 (L) Jan 07, 2024   CO2 25 09/11/2017   GLUCOSE 187 (H) 01-07-24   BUN 12 Jan 07, 2024   CREATININE 0.90 01/07/24   CALCIUM  10.0 09/11/2017   GFRNONAA 91 09/11/2017   GFRAA 105 09/11/2017   Lipid Panel:  Lab Results  Component Value Date   LDLCALC 79 07/04/2017   HgbA1c:  Lab Results  Component Value Date   HGBA1C 7.0 (A) 09/11/2017   Urine Drug Screen: No results found for: LABOPIA, COCAINSCRNUR, LABBENZ, AMPHETMU, THCU, LABBARB  Alcohol Level No results found for: [facility] INR  Lab Results  Component Value Date   INR 1.06 03/03/2011   APTT No results found for: APTT AED levels: No results found for: PHENYTOIN, ZONISAMIDE, LAMOTRIGINE, LEVETIRACETA  CT Head without contrast(Personally reviewed): CTH was negative for a large hypodensity concerning for a large territory infarct or hyperdensity concerning  for an ICH  CT angio Head and Neck with contrast(Personally reviewed): No LVO  MRI Brain(Personally reviewed): No acute stroke  ASSESSMENT   Nirel Babler is a 63 y.o. female with hx of Bipolar disorder, depression, DM2, non alcoholic farry liver, GERD, HLD, hypothyroidism, ocular myasthenia who presents with worsening generalized weakness x [redacted] week along with diarrhea, been sick all day yesterday with diplopia and tonight, 2 episodes of vomiting and fall and hit her head. Been very confused and was unable to recall her birthday, took her some time to get her daughters name.  She has horizontal nystagmus that is direction changing on exam  which per patient and husband is chronic. She is also drowsy and lethargic with significantly poor attention. She has difficulty with naming 2 out of several objects but dont think that she has aphasia out of proportion to her encephalopathy.  STAT MRI Brain w/o contrast is negative for any acute abnormalities.  Overall, presentation seems to be concerning for toxic metabolic encephalopathy rather than a stroke.  RECOMMENDATIONS  - encephalopathy labs including B12, folate, TSH, B1. - rEEG in AM. - recommend evaluation for infection including UA, Ucx, CXR, COVID, Flu, RSV. - neurology will follow up pending labs above but otherwise, be available PRN for questions and concerns. ______________________________________________________________________  Plan discussed with patient, her husband and with Dr. Theadore with the ED team.  I personally spent a total of 75 minutes in the care of the patient today including preparing to see the patient, getting/reviewing separately obtained history, performing a medically appropriate exam/evaluation, counseling and educating, placing orders, referring and communicating with other health care professionals, documenting clinical information in the EHR, independently interpreting results, communicating results, and  coordinating care.   Signed, Kadeja Granada, MD [facility]

## 2024-01-02 LAB — VITAMIN B1: Vitamin B1 (Thiamine): 138.8 nmol/L (ref 66.5–200.0)
